# Patient Record
Sex: Female | Born: 1985
Health system: Southern US, Community
[De-identification: ages and names within clinical notes are randomized; demographics above are authoritative.]

## PROBLEM LIST (undated history)

## (undated) ENCOUNTER — Inpatient Hospital Stay (HOSPITAL_COMMUNITY): Payer: Self-pay

## (undated) DIAGNOSIS — K219 Gastro-esophageal reflux disease without esophagitis: Secondary | ICD-10-CM

## (undated) DIAGNOSIS — G44029 Chronic cluster headache, not intractable: Secondary | ICD-10-CM

## (undated) DIAGNOSIS — I10 Essential (primary) hypertension: Secondary | ICD-10-CM

## (undated) DIAGNOSIS — J301 Allergic rhinitis due to pollen: Secondary | ICD-10-CM

## (undated) DIAGNOSIS — G709 Myoneural disorder, unspecified: Secondary | ICD-10-CM

## (undated) DIAGNOSIS — G35 Multiple sclerosis: Secondary | ICD-10-CM

## (undated) HISTORY — DX: Multiple sclerosis: G35

## (undated) HISTORY — DX: Gastro-esophageal reflux disease without esophagitis: K21.9

## (undated) HISTORY — DX: Allergic rhinitis due to pollen: J30.1

## (undated) HISTORY — DX: Chronic cluster headache, not intractable: G44.029

---
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
## 2007-04-17 ENCOUNTER — Ambulatory Visit: Payer: Self-pay | Admitting: Internal Medicine

## 2008-03-29 ENCOUNTER — Encounter: Admission: RE | Admit: 2008-03-29 | Discharge: 2008-03-29 | Payer: Self-pay | Admitting: Obstetrics and Gynecology

## 2008-11-29 ENCOUNTER — Ambulatory Visit: Payer: Self-pay | Admitting: Internal Medicine

## 2008-11-29 DIAGNOSIS — K219 Gastro-esophageal reflux disease without esophagitis: Secondary | ICD-10-CM

## 2008-11-29 HISTORY — DX: Gastro-esophageal reflux disease without esophagitis: K21.9

## 2008-11-29 LAB — CONVERTED CEMR LAB
Basophils Absolute: 0 10*3/uL (ref 0.0–0.1)
Basophils Relative: 0.3 % (ref 0.0–3.0)
Eosinophils Absolute: 0.1 10*3/uL (ref 0.0–0.7)
Eosinophils Relative: 1.2 % (ref 0.0–5.0)
HCT: 39 % (ref 36.0–46.0)
Hemoglobin: 13.5 g/dL (ref 12.0–15.0)
Lymphocytes Relative: 53.6 % — ABNORMAL HIGH (ref 12.0–46.0)
MCHC: 34.5 g/dL (ref 30.0–36.0)
MCV: 90.2 fL (ref 78.0–100.0)
Monocytes Absolute: 0.6 10*3/uL (ref 0.1–1.0)
Monocytes Relative: 10.4 % (ref 3.0–12.0)
Neutro Abs: 2.1 10*3/uL (ref 1.4–7.7)
Neutrophils Relative %: 34.5 % — ABNORMAL LOW (ref 43.0–77.0)
Platelets: 232 10*3/uL (ref 150–400)
RBC: 4.34 M/uL (ref 3.87–5.11)
RDW: 12.3 % (ref 11.5–14.6)
TSH: 0.7 microintl units/mL (ref 0.35–5.50)
WBC: 6.1 10*3/uL (ref 4.5–10.5)

## 2008-12-03 ENCOUNTER — Ambulatory Visit: Payer: Self-pay | Admitting: Internal Medicine

## 2008-12-03 ENCOUNTER — Encounter: Admission: RE | Admit: 2008-12-03 | Discharge: 2008-12-03 | Payer: Self-pay | Admitting: Internal Medicine

## 2008-12-09 ENCOUNTER — Telehealth: Payer: Self-pay | Admitting: Internal Medicine

## 2008-12-09 ENCOUNTER — Ambulatory Visit: Payer: Self-pay | Admitting: Internal Medicine

## 2008-12-25 ENCOUNTER — Ambulatory Visit: Payer: Self-pay | Admitting: Internal Medicine

## 2009-01-02 ENCOUNTER — Telehealth: Payer: Self-pay | Admitting: *Deleted

## 2009-04-23 ENCOUNTER — Ambulatory Visit: Payer: Self-pay | Admitting: Internal Medicine

## 2010-01-11 ENCOUNTER — Emergency Department (HOSPITAL_COMMUNITY): Admission: EM | Admit: 2010-01-11 | Discharge: 2010-01-11 | Payer: Self-pay | Admitting: Emergency Medicine

## 2010-01-18 ENCOUNTER — Encounter: Admission: RE | Admit: 2010-01-18 | Discharge: 2010-01-18 | Payer: Self-pay | Admitting: Neurosurgery

## 2010-01-22 ENCOUNTER — Telehealth (INDEPENDENT_AMBULATORY_CARE_PROVIDER_SITE_OTHER): Payer: Self-pay | Admitting: *Deleted

## 2010-02-06 ENCOUNTER — Ambulatory Visit: Payer: Self-pay | Admitting: Internal Medicine

## 2010-02-06 DIAGNOSIS — G44029 Chronic cluster headache, not intractable: Secondary | ICD-10-CM

## 2010-02-06 DIAGNOSIS — J301 Allergic rhinitis due to pollen: Secondary | ICD-10-CM

## 2010-02-06 HISTORY — DX: Chronic cluster headache, not intractable: G44.029

## 2010-02-06 HISTORY — DX: Allergic rhinitis due to pollen: J30.1

## 2010-03-12 ENCOUNTER — Ambulatory Visit: Payer: Self-pay | Admitting: Internal Medicine

## 2010-10-11 ENCOUNTER — Encounter: Admission: RE | Admit: 2010-10-11 | Discharge: 2010-10-11 | Payer: Self-pay | Admitting: Neurosurgery

## 2010-10-15 ENCOUNTER — Encounter: Admission: RE | Admit: 2010-10-15 | Discharge: 2010-10-15 | Payer: Self-pay | Admitting: Neurosurgery

## 2010-10-22 ENCOUNTER — Telehealth: Payer: Self-pay | Admitting: Internal Medicine

## 2010-11-05 ENCOUNTER — Telehealth: Payer: Self-pay | Admitting: Internal Medicine

## 2010-12-17 NOTE — Assessment & Plan Note (Signed)
Summary: 1 MONTH FUP//CCM   Vital Signs:  Patient profile:   25 year old female Height:      65 inches Weight:      168 pounds BMI:     28.06 Temp:     98.2 degrees F oral Pulse rate:   76 / minute Resp:     14 per minute BP sitting:   120 / 80  (left arm)  Vitals Entered By: Willy Eddy, LPN (March 12, 2010 11:34 AM) CC: rov- headaches- continues to have and pt states the one sh e had last night was her worse one, Hypertension Management, URI symptoms   CC:  rov- headaches- continues to have and pt states the one sh e had last night was her worse one, Hypertension Management, and URI symptoms.  History of Present Illness:  URI Symptoms      This is a 25 year old woman who presents with URI symptoms.  The patient reports nasal congestion, clear nasal discharge, and earache.  The patient denies fever, low-grade fever (<100.5 degrees), fever of 100.5-103 degrees, fever of 103.1-104 degrees, fever to >104 degrees, stiff neck, dyspnea, wheezing, rash, vomiting, diarrhea, use of an antipyretic, and response to antipyretic.  The patient also reports itchy throat.  The patient denies the following risk factors for Strep sinusitis: unilateral facial pain, unilateral nasal discharge, poor response to decongestant, double sickening, tooth pain, Strep exposure, tender adenopathy, and absence of cough.  head ache patteri mproved   Hypertension History:      She denies headache, chest pain, palpitations, dyspnea with exertion, orthopnea, PND, peripheral edema, visual symptoms, neurologic problems, syncope, and side effects from treatment.        Negative major cardiovascular risk factors include female age less than 66 years old and non-tobacco-user status.    Preventive Screening-Counseling & Management  Alcohol-Tobacco     Smoking Status: never  Current Problems (verified): 1)  Allergic Rhinitis Due To Pollen  (ICD-477.0) 2)  Gerd  (ICD-530.81) 3)  Headache, Cluster, Chronic   (ICD-339.02) 4)  Gastroesophageal Reflux Disease, Mild  (ICD-530.81)  Current Medications (verified): 1)  Birth Control Pills .... Use As Directed 2)  Omeprazole 40 Mg Cpdr (Omeprazole) .... One By Mouth Daily 3)  Loratadine-Pseudoephedrine 10-240 Mg Xr24h-Tab (Loratadine-Pseudoephedrine) .... One By Mouth Bid 4)  Verapamil Hcl 40 Mg Tabs (Verapamil Hcl) .... One By Mouth Every 6 Hours With Food ( At Least Three Time A Day) 5)  Omnaris 50 Mcg/act Susp (Ciclesonide) .... Two Spray in Each Nostril  Two Times A Day  Allergies (verified): No Known Drug Allergies  Past History:  Social History: Last updated: 12/09/2008 Single Never Smoked Drug use-no  Risk Factors: Smoking Status: never (03/12/2010)  Past medical, surgical, family and social histories (including risk factors) reviewed, and no changes noted (except as noted below).  Past Medical History: Reviewed history from 12/25/2008 and no changes required. Headache GERD  Past Surgical History: Reviewed history from 12/09/2008 and no changes required. Denies surgical history  Family History: Reviewed history and no changes required.  Social History: Reviewed history from 12/09/2008 and no changes required. Single Never Smoked Drug use-no  Physical Exam  General:  Well-developed,well-nourished,in no acute distress; alert,appropriate and cooperative throughout examination Head:  Normocephalic and atraumatic without obvious abnormalities. No apparent alopecia or balding. Eyes:  vision grossly intact, pupils equal, and pupils round.   Ears:  R ear normal and L ear normal.   Nose:  no external deformity.  Mouth:  pharyngeal erythema, posterior lymphoid hypertrophy, and postnasal drip.   Neck:  No deformities, masses, or tenderness noted. Lungs:  Normal respiratory effort, chest expands symmetrically. Lungs are clear to auscultation, no crackles or wheezes. Heart:  Normal rate and regular rhythm. S1 and S2 normal  without gallop, murmur, click, rub or other extra sounds.   Impression & Recommendations:  Problem # 1:  HEADACHE, CLUSTER, CHRONIC (ICD-339.02) the verapamil helped the pattter but then she had a sinues HA with PND and conjestons which precipitated a different HA patterm  Problem # 2:  GERD (ICD-530.81) stable Her updated medication list for this problem includes:    Omeprazole 40 Mg Cpdr (Omeprazole) ..... One by mouth daily  Labs Reviewed: Hgb: 13.5 (11/29/2008)   Hct: 39.0 (11/29/2008)  Problem # 3:  ALLERGIC RHINITIS DUE TO POLLEN (ICD-477.0) omnaris depo shot if the symptoms persist fr a week after the shot and te allergrda may call in antibiotic  Complete Medication List: 1)  Birth Control Pills  .... Use as directed 2)  Omeprazole 40 Mg Cpdr (Omeprazole) .... One by mouth daily 3)  Loratadine-pseudoephedrine 10-240 Mg Xr24h-tab (Loratadine-pseudoephedrine) .... One by mouth bid 4)  Verapamil Hcl Cr 180 Mg Cr-tabs (Verapamil hcl) .... One by mouth daily 5)  Omnaris 50 Mcg/act Susp (Ciclesonide) .... Two spray in each nostril  two times a day 6)  Fexofenadine Hcl 180 Mg Tabs (Fexofenadine hcl) .... One by mouth daily  Hypertension Assessment/Plan:      The patient's hypertensive risk group is category A: No risk factors and no target organ damage.  Today's blood pressure is 120/80.  Her blood pressure goal is < 140/90.   Patient Instructions: 1)  Please schedule a follow-up appointment in 3 months. Prescriptions: VERAPAMIL HCL CR 180 MG CR-TABS (VERAPAMIL HCL) one by mouth daily  #30 x 11   Entered and Authorized by:   Stacie Glaze MD   Signed by:   Stacie Glaze MD on 03/12/2010   Method used:   Electronically to        [facility] (retail)       [address]       [place], [place]  [address]       Ph: [phone]       Fax: [phone]   RxID:   [id number] FEXOFENADINE HCL 180 MG TABS (FEXOFENADINE HCL) one by mouth daily   #30 x 11   Entered and Authorized by:   Stacie Glaze MD   Signed by:   Stacie Glaze MD on 03/12/2010   Method used:   Electronically to        [facility] (retail)       [address]       [place], [place]  [address]       Ph: [phone]       Fax: [phone]   RxID:   [id number]   Appended Document: Orders Update    Clinical Lists Changes  Orders: Added new Service order of Depo- Medrol 80mg  (J1040) - Signed Added new Service order of Admin of Therapeutic Inj  intramuscular or subcutaneous (06301) - Signed       Medication Administration  Injection # 1:    Medication: Depo- Medrol 80mg     Diagnosis: ALLERGIC RHINITIS DUE TO POLLEN (ICD-477.0)    Route: IM  Site: RUOQ gluteus    Exp Date: 09/15/2012    Lot #: [id number]    Mfr: Pharmacia    Comments: 1.5/120mg  given    Patient tolerated injection without complications    Given by: pam spell,rn  Orders Added: 1)  Depo- Medrol 80mg  [J1040] 2)  Admin of Therapeutic Inj  intramuscular or subcutaneous [[address]]

## 2010-12-17 NOTE — Progress Notes (Signed)
Summary: BP questions  Phone Note Call from Patient   Caller: Mom Call For: Stacie Glaze MD Reason for Call: Talk to Doctor Summary of Call: Pt's mom states that her daughter's BP was 148 100 at the Neurologist's office today and was advised to see Dr. Lovell Sheehan.  Appt was made for Monday, but she wants to know if Dr. Lovell Sheehan needs to give her meds before the appt?  This is the only BP reading she has.  [phone] Initial call taken by: Lynann Beaver CMA,  January 22, 2010 12:04 PM  Follow-up for Phone Call        per dr Lovell Sheehan- gpurchase a blood pressure cuff or go to local fire station and get bp readings twice a day  and bring readings at ov-- that bp is not that critical for someone her age Follow-up by: Willy Eddy, LPN,  January 22, 2010 12:57 PM  Additional Follow-up for Phone Call Additional follow up Details #1::        Phone Call Completed Additional Follow-up by: Raechel Ache, RN,  January 22, 2010 12:59 PM

## 2010-12-17 NOTE — Progress Notes (Signed)
Summary: questions about meds  Phone Note Call from Patient   Caller: Mom Call For: Stacie Glaze MD Summary of Call: Topamax is making her nauseated and headache is worse. [phone] Initial call taken by: Lynann Beaver CMA AAMA,  November 05, 2010 8:47 AM  Follow-up for Phone Call        stop the topamax increase the verapamil to 240 Follow-up by: Stacie Glaze MD,  November 05, 2010 8:54 AM    New/Updated Medications: VERELAN 240 MG XR24H-CAP (VERAPAMIL HCL) one by mouth daily Prescriptions: VERELAN 240 MG XR24H-CAP (VERAPAMIL HCL) one by mouth daily  #90 x 1   Entered by:   Lynann Beaver CMA AAMA   Authorized by:   Stacie Glaze MD   Signed by:   Lynann Beaver CMA AAMA on 11/05/2010   Method used:   Electronically to        [facility] (retail)       [address]       [place], [place]  [address]       Ph: [phone]       Fax: [phone]   RxID:   [id number]  Notified pt's mom.

## 2010-12-17 NOTE — Progress Notes (Signed)
Summary: headaches  Phone Note Call from Patient Call back at [phone]   Caller: Patient Call For: Stacie Glaze MD Reason for Call: Acute Illness Summary of Call: Verapamil is not helping with the headaches x 1 -2 weeks.  Nico[facility])  Initial call taken by: Lynann Beaver CMA AAMA,  October 22, 2010 1:13 PM  Follow-up for Phone Call        may stop verapamil-start topamax and titrate up-- topamax 25 1 at bedtime for 1 week- then 1 in am and 1 at bedtime for 1 week and then in  1in am and 2 at bedtime and continue with that dosage  Follow-up by: Willy Eddy, LPN,  October 22, 2010 1:51 PM    New/Updated Medications: TOPAMAX 25 MG TABS (TOPIRAMATE) 1 q hs x one week, then 1 in am and q hs x one week, then 1 in am and 2 q hs and continue Prescriptions: TOPAMAX 25 MG TABS (TOPIRAMATE) 1 q hs x one week, then 1 in am and q hs x one week, then 1 in am and 2 q hs and continue  #100 x 0   Entered by:   Lynann Beaver CMA AAMA   Authorized by:   Stacie Glaze MD   Signed by:   Lynann Beaver CMA AAMA on 10/22/2010   Method used:   Electronically to        [facility] (retail)       [address]       [place], [place]  [address]       Ph: [phone]       Fax: [phone]   RxID:   [id number]  Notified pt.

## 2010-12-17 NOTE — Assessment & Plan Note (Signed)
Summary: F/U ON HTN CONCERNS // RS   Vital Signs:  Patient profile:   25 year old female Height:      65 inches Weight:      170 pounds BMI:     28.39 Temp:     98.2 degrees F oral Pulse rate:   76 / minute Resp:     14 per minute BP sitting:   150 / 100  (left arm)  Vitals Entered By: Willy Eddy, LPN (February 06, 2010 10:07 AM) CC: c/o elevated bp-taking loratadine with sudafed, Headache   CC:  c/o elevated bp-taking loratadine with sudafed and Headache.  History of Present Illness: pt has been having cluster head aches and blood pressure was noted to be up then and now   Headache HPI:      The patient comes in for chronic management of headaches which have been unstable.  Since the last visit, the frequency of headaches have increased, and the intensity of the headaches have increased.  The headaches will last anywhere from 30 minutes to several days at a time.  Headaches have been occurring since age 32.  The patient is right handed.        The location of the headaches are bilateral.  Headache quality is throbbing or pulsating.  The headaches are associated with nausea, photophobia, and tearing of the eyes.        Positive alarm features include change in frequency from prior H/A's, change in severity from prior H/A's, and change in features from prior H/A's.         Preventive Screening-Counseling & Management  Alcohol-Tobacco     Smoking Status: never  Problems Prior to Update: 1)  Allergic Rhinitis Due To Other Allergen  (ICD-477.8) 2)  Gerd  (ICD-530.81) 3)  Acute Sinusitis, Unspecified  (ICD-461.9) 4)  Headache  (ICD-784.0) 5)  Gastroesophageal Reflux Disease, Mild  (ICD-530.81) 6)  Swelling, Neck  (ICD-784.2) 7)  Cough  (ICD-786.2)  Current Problems (verified): 1)  Allergic Rhinitis Due To Other Allergen  (ICD-477.8) 2)  Gerd  (ICD-530.81) 3)  Acute Sinusitis, Unspecified  (ICD-461.9) 4)  Headache  (ICD-784.0) 5)  Gastroesophageal Reflux Disease, Mild   (ICD-530.81) 6)  Swelling, Neck  (ICD-784.2) 7)  Cough  (ICD-786.2)  Medications Prior to Update: 1)  Birth Control Pills .... Use As Directed 2)  Omeprazole 40 Mg Cpdr (Omeprazole) .... One By Mouth Daily 3)  Loratadine-Pseudoephedrine 10-240 Mg Xr24h-Tab (Loratadine-Pseudoephedrine) .... One By Mouth Bid  Current Medications (verified): 1)  Birth Control Pills .... Use As Directed 2)  Omeprazole 40 Mg Cpdr (Omeprazole) .... One By Mouth Daily 3)  Loratadine-Pseudoephedrine 10-240 Mg Xr24h-Tab (Loratadine-Pseudoephedrine) .... One By Mouth Bid  Allergies (verified): No Known Drug Allergies  Past History:  Social History: Last updated: 12/09/2008 Single Never Smoked Drug use-no  Risk Factors: Smoking Status: never (02/06/2010)  Past medical, surgical, family and social histories (including risk factors) reviewed, and no changes noted (except as noted below).  Past Medical History: Reviewed history from 12/25/2008 and no changes required. Headache GERD  Past Surgical History: Reviewed history from 12/09/2008 and no changes required. Denies surgical history  Family History: Reviewed history and no changes required.  Social History: Reviewed history from 12/09/2008 and no changes required. Single Never Smoked Drug use-no  Review of Systems       The patient complains of anorexia.  The patient denies fever, weight loss, weight gain, vision loss, decreased hearing, hoarseness, chest pain, syncope, dyspnea on  exertion, peripheral edema, prolonged cough, headaches, hemoptysis, abdominal pain, melena, hematochezia, severe indigestion/heartburn, hematuria, incontinence, genital sores, muscle weakness, suspicious skin lesions, transient blindness, difficulty walking, depression, unusual weight change, abnormal bleeding, enlarged lymph nodes, angioedema, and breast masses.    Physical Exam  General:  Well-developed,well-nourished,in no acute distress; alert,appropriate and  cooperative throughout examination Head:  Normocephalic and atraumatic without obvious abnormalities. No apparent alopecia or balding. Eyes:  vision grossly intact, pupils equal, and pupils round.   Ears:  R ear normal and L ear normal.   Nose:  nasal dischargemucosal pallor, mucosal edema, and airflow obstruction.   Mouth:  pharyngeal erythema.   Neck:  No deformities, masses, or tenderness noted. Lungs:  Normal respiratory effort, chest expands symmetrically. Lungs are clear to auscultation, no crackles or wheezes. Heart:  Normal rate and regular rhythm. S1 and S2 normal without gallop, murmur, click, rub or other extra sounds. Abdomen:  Bowel sounds positive,abdomen soft and non-tender without masses, organomegaly or hernias noted.   Impression & Recommendations:  Problem # 1:  HEADACHE, CLUSTER, CHRONIC (ICD-339.02) will use a verapamil protocol and then add nasal spry to reduce allergy precipitants follow up protocol in 1 month see DC instruction for protocol  Problem # 2:  ALLERGIC RHINITIS DUE TO POLLEN (ICD-477.0) omnaris two puff by mouth two times a day  Complete Medication List: 1)  Birth Control Pills  .... Use as directed 2)  Omeprazole 40 Mg Cpdr (Omeprazole) .... One by mouth daily 3)  Loratadine-pseudoephedrine 10-240 Mg Xr24h-tab (Loratadine-pseudoephedrine) .... One by mouth bid 4)  Verapamil Hcl 40 Mg Tabs (Verapamil hcl) .... One by mouth every 6 hours with food ( at least three time a day) 5)  Omnaris 50 Mcg/act Susp (Ciclesonide) .... Two spray in each nostril  two times a day  Patient Instructions: 1)  the verapamil is to stop the cluster HA take at least three times a day with small amont of food 2)  use the nasel spray twice a day 3)  Please schedule a follow-up appointment in 1 month. Prescriptions: VERAPAMIL HCL 40 MG TABS (VERAPAMIL HCL) one by mouth every 6 hours with food ( at least three time a day)  #120 x 3   Entered and Authorized by:   Stacie Glaze MD   Signed by:   Stacie Glaze MD on 02/06/2010   Method used:   Electronically to        [facility] (retail)       [address]       [place], [place]  [address]       Ph: [phone]       Fax: [phone]   RxID:   [id number]

## 2011-02-04 LAB — CBC
HCT: 37.9 % (ref 36.0–46.0)
Hemoglobin: 12.9 g/dL (ref 12.0–15.0)
RBC: 4.32 MIL/uL (ref 3.87–5.11)
WBC: 8.9 10*3/uL (ref 4.0–10.5)

## 2011-02-04 LAB — ACETAMINOPHEN LEVEL: Acetaminophen (Tylenol), Serum: <10 ug/mL — ABNORMAL LOW (ref 10–30)

## 2011-02-04 LAB — DIFFERENTIAL
Basophils Absolute: 0 10*3/uL (ref 0.0–0.1)
Eosinophils Relative: 0 % (ref 0–5)
Lymphocytes Relative: 40 % (ref 12–46)
Lymphs Abs: 3.5 10*3/uL (ref 0.7–4.0)
Monocytes Absolute: 0.9 10*3/uL (ref 0.1–1.0)
Monocytes Relative: 10 % (ref 3–12)
Neutro Abs: 4.4 10*3/uL (ref 1.7–7.7)

## 2011-02-04 LAB — URINE MICROSCOPIC-ADD ON

## 2011-02-04 LAB — BASIC METABOLIC PANEL
Calcium: 9.5 mg/dL (ref 8.4–10.5)
GFR calc Af Amer: >60 mL/min (ref >60)
GFR calc non Af Amer: >60 mL/min (ref >60)
Potassium: 3.6 mEq/L (ref 3.5–5.1)
Sodium: 138 mEq/L (ref 135–145)

## 2011-02-04 LAB — URINALYSIS, ROUTINE W REFLEX MICROSCOPIC
Glucose, UA: NEGATIVE mg/dL
Ketones, ur: NEGATIVE mg/dL
Leukocytes, UA: NEGATIVE
Protein, ur: 30 mg/dL — AB
pH: 6.5 (ref 5.0–8.0)

## 2011-02-04 LAB — POCT PREGNANCY, URINE: Preg Test, Ur: NEGATIVE

## 2011-02-04 LAB — RAPID URINE DRUG SCREEN, HOSP PERFORMED
Cocaine: NOT DETECTED
Tetrahydrocannabinol: NOT DETECTED

## 2011-08-24 ENCOUNTER — Telehealth: Payer: Self-pay | Admitting: *Deleted

## 2011-08-24 NOTE — Telephone Encounter (Signed)
Mom called stating pt's BP has been running 146/104, 152/108, 145/107, and is on a BP med, but she does not know what.  Asking Dr. Lovell Sheehan for suggestions?

## 2011-08-24 NOTE — Telephone Encounter (Signed)
Notified Mom and appt scheduled with Dr. Caryl Never.

## 2011-08-24 NOTE — Telephone Encounter (Signed)
She has sudafed listed - if she is taking sudafed that could be the reason --- she takes verapamil---if it continues to be elevated she will have to see  A doc. Not been seen since 4-11 ( 1 and a half years).she needs ov anyway because it has been so long

## 2011-08-30 ENCOUNTER — Encounter: Payer: Self-pay | Admitting: Family Medicine

## 2011-08-30 ENCOUNTER — Ambulatory Visit (INDEPENDENT_AMBULATORY_CARE_PROVIDER_SITE_OTHER): Payer: Self-pay | Admitting: Family Medicine

## 2011-08-30 VITALS — BP 140/90 | Temp 99.2°F | Wt 184.0 lb

## 2011-08-30 DIAGNOSIS — IMO0001 Reserved for inherently not codable concepts without codable children: Secondary | ICD-10-CM

## 2011-08-30 DIAGNOSIS — R638 Other symptoms and signs concerning food and fluid intake: Secondary | ICD-10-CM

## 2011-08-30 DIAGNOSIS — R42 Dizziness and giddiness: Secondary | ICD-10-CM

## 2011-08-30 DIAGNOSIS — I10 Essential (primary) hypertension: Secondary | ICD-10-CM

## 2011-08-30 DIAGNOSIS — Z23 Encounter for immunization: Secondary | ICD-10-CM

## 2011-08-30 NOTE — Patient Instructions (Signed)

## 2011-08-30 NOTE — Progress Notes (Signed)
  Subjective:    Patient ID: [name], female    DOB: 28-Sep-1986, 25 y.o.   MRN: [id number]  HPI Seen for elevated blood pressure. History of questionable cluster headache. Has been on verapamil for several years. Also takes Maxzide per  her gynecologist apparently more for edema issues. Recently had some malaise and intermittent headaches. Had blood pressure recently at home 154/108. Frequent systolics 140-150 range with diastolics usually 90s to 100. Patient also relates some increased thirst and urine frequency. No recent weight changes. Also craving ice frequently. No reported history of anemia. Occasional dizziness.  Family history significant for mother with hypertension. Patient is not taking a regular nonsteroidals.  No regular alcohol use. No regular exercise.  Past Medical History  Diagnosis Date  . HEADACHE, CLUSTER, CHRONIC 02/06/2010  . Allergic rhinitis due to pollen 02/06/2010  . Esophageal reflux 11/29/2008   History reviewed. No pertinent past surgical history.  reports that she has never smoked. She does not have any smokeless tobacco history on file. Her alcohol and drug histories not on file. family history is not on file. Not on File    Review of Systems  Constitutional: Negative for fatigue and unexpected weight change.  Eyes: Negative for visual disturbance.  Respiratory: Negative for cough, chest tightness, shortness of breath and wheezing.   Cardiovascular: Negative for chest pain, palpitations and leg swelling.  Gastrointestinal: Negative for abdominal pain and blood in stool.  Genitourinary: Negative for hematuria.  Neurological: Negative for dizziness, seizures, syncope, weakness, light-headedness and headaches.       Objective:   Physical Exam  Constitutional: She is oriented to person, place, and time. She appears well-developed and well-nourished. No distress.  HENT:  Mouth/Throat: Oropharynx is clear and moist.  Eyes: Pupils are equal, round, and  reactive to light.  Neck: Neck supple. No thyromegaly present.  Cardiovascular: Normal rate and regular rhythm.   No murmur heard. Pulmonary/Chest: Effort normal and breath sounds normal. No respiratory distress. She has no wheezes. She has no rales.  Musculoskeletal: She exhibits no edema.  Lymphadenopathy:    She has no cervical adenopathy.  Neurological: She is alert and oriented to person, place, and time.          Assessment & Plan:  #1Hypertension. Marginal control. Discussed options. Rather than adding more medication at this time recommend trial of weight loss and sodium reduction. Establish more regular exercise. Follow with primary 3 months to reassess. Check basic metabolic panel. #2 recent ice craving. No history of anemia. Check CBC

## 2011-08-31 LAB — CBC WITH DIFFERENTIAL/PLATELET
Basophils Relative: 0.4 % (ref 0.0–3.0)
Eosinophils Absolute: 0 10*3/uL (ref 0.0–0.7)
HCT: 36.1 % (ref 36.0–46.0)
Hemoglobin: 11.9 g/dL — ABNORMAL LOW (ref 12.0–15.0)
Lymphocytes Relative: 35 % (ref 12.0–46.0)
Lymphs Abs: 2.2 10*3/uL (ref 0.7–4.0)
MCHC: 33 g/dL (ref 30.0–36.0)
Monocytes Relative: 9.1 % (ref 3.0–12.0)
Neutro Abs: 3.5 10*3/uL (ref 1.4–7.7)
RBC: 4.01 Mil/uL (ref 3.87–5.11)

## 2011-08-31 LAB — BASIC METABOLIC PANEL
CO2: 23 mEq/L (ref 19–32)
Calcium: 8.9 mg/dL (ref 8.4–10.5)
Potassium: 3.7 mEq/L (ref 3.5–5.1)
Sodium: 140 mEq/L (ref 135–145)

## 2011-09-01 NOTE — Progress Notes (Signed)
Quick Note:  Pt informed ______ 

## 2011-10-27 ENCOUNTER — Other Ambulatory Visit: Payer: Self-pay | Admitting: Neurosurgery

## 2011-10-27 DIAGNOSIS — R519 Headache, unspecified: Secondary | ICD-10-CM

## 2011-11-03 ENCOUNTER — Ambulatory Visit
Admission: RE | Admit: 2011-11-03 | Discharge: 2011-11-03 | Disposition: A | Discharge status: Home / Self Care | Payer: PRIVATE HEALTH INSURANCE | Source: Ambulatory Visit | Attending: Neurosurgery | Admitting: Neurosurgery

## 2011-11-03 DIAGNOSIS — R519 Headache, unspecified: Secondary | ICD-10-CM

## 2011-12-01 ENCOUNTER — Ambulatory Visit: Payer: Self-pay | Admitting: Internal Medicine

## 2011-12-16 ENCOUNTER — Encounter: Payer: Self-pay | Admitting: Family

## 2011-12-16 ENCOUNTER — Ambulatory Visit (INDEPENDENT_AMBULATORY_CARE_PROVIDER_SITE_OTHER): Payer: PRIVATE HEALTH INSURANCE | Admitting: Family

## 2011-12-16 DIAGNOSIS — J019 Acute sinusitis, unspecified: Secondary | ICD-10-CM

## 2011-12-16 DIAGNOSIS — J309 Allergic rhinitis, unspecified: Secondary | ICD-10-CM

## 2011-12-16 MED ORDER — AZITHROMYCIN 250 MG PO TABS: ORAL_TABLET | ORAL | Status: AC

## 2011-12-16 NOTE — Patient Instructions (Signed)

## 2011-12-16 NOTE — Progress Notes (Signed)
Subjective:    Patient ID: [name], female    DOB: 01/01/1986, 26 y.o.   MRN: [id number]  HPI 26 year old [place] American female, patient of Dr. Lovell Sheehan presents today with complaints of sneezing, coughing, congestion sore throat, sinus pressure and pain that has worsened over the last 5 days. Hasn't taken Zicam and Allegra with no relief. She started carefully yesterday with no relief she denies any fever, muscle aches or pain. She's a nonsmoker. She has a history of allergic rhinitis.   Review of Systems  Constitutional: Positive for fatigue.  HENT: Positive for congestion, sore throat, rhinorrhea, sneezing and postnasal drip.   Eyes: Negative.   Respiratory: Positive for cough. Negative for choking, chest tightness, wheezing and stridor.   Cardiovascular: Negative.   Neurological: Negative.   Hematological: Negative.   Psychiatric/Behavioral: Negative.    Past Medical History  Diagnosis Date  . HEADACHE, CLUSTER, CHRONIC 02/06/2010  . Allergic rhinitis due to pollen 02/06/2010  . Esophageal reflux 11/29/2008    History   Social History  . Marital Status: Single    Spouse Name: N/A    Number of Children: N/A  . Years of Education: N/A   Occupational History  . Not on file.   Social History Main Topics  . Smoking status: Never Smoker   . Smokeless tobacco: Not on file  . Alcohol Use: Not on file  . Drug Use: Not on file  . Sexually Active: Not on file   Other Topics Concern  . Not on file   Social History Narrative  . No narrative on file    No past surgical history on file.  No family history on file.  No Known Allergies  Current Outpatient Prescriptions on File Prior to Visit  Medication Sig Dispense Refill  . ciclesonide (OMNARIS) 50 MCG/ACT nasal spray Place 2 sprays into both nostrils daily.        . fexofenadine (ALLEGRA) 180 MG tablet Take 180 mg by mouth daily as needed.       . norethindrone-ethinyl estradiol-iron (ESTROSTEP FE,TILIA  FE,TRI-LEGEST FE) 1-20/1-30/1-35 MG-MCG tablet Take 1 tablet by mouth daily.        Marland Kitchen omeprazole (PRILOSEC) 40 MG capsule Take 40 mg by mouth daily.        Marland Kitchen triamterene-hydrochlorothiazide (MAXZIDE-25) 37.5-25 MG per tablet Take 1 tablet by mouth daily.        . verapamil (VERELAN PM) 240 MG 24 hr capsule Take 240 mg by mouth at bedtime.          BP 142/96  Temp(Src) 98.9 F (37.2 C) (Oral)  Wt 186 lb (84.369 kg)chart    Objective:   Physical Exam  Constitutional: She is oriented to person, place, and time. She appears well-developed and well-nourished.  HENT:  Right Ear: External ear normal.  Left Ear: External ear normal.  Nose: Nose normal.  Mouth/Throat: Oropharynx is clear and moist.       Tenderness to palpation of the frontal and maxillary sinuses. Pain is worse with forward bend  Neck: Normal range of motion. Neck supple.  Cardiovascular: Normal rate and regular rhythm.   Pulmonary/Chest: Effort normal and breath sounds normal.  Musculoskeletal: Normal range of motion.  Neurological: She is alert and oriented to person, place, and time.  Skin: Skin is warm.  Psychiatric: She has a normal mood and affect.          Assessment & Plan:  Assessment: Allergic rhinitis, acute sinusitis  Plan: Continue Allegra 180 mg once  daily. Zicam 3 days on and off. Z-Pak as directed. Rest. Drink clear fluids. Call the office is symptoms worsen or persist, recheck in schedule and when necessary.

## 2011-12-28 ENCOUNTER — Emergency Department (INDEPENDENT_AMBULATORY_CARE_PROVIDER_SITE_OTHER)
Admission: EM | Admit: 2011-12-28 | Discharge: 2011-12-28 | Disposition: A | Discharge status: Home / Self Care | Payer: Medicaid Other | Source: Home / Self Care | Attending: Emergency Medicine | Admitting: Emergency Medicine

## 2011-12-28 ENCOUNTER — Encounter (HOSPITAL_COMMUNITY): Payer: Self-pay | Admitting: *Deleted

## 2011-12-28 DIAGNOSIS — O200 Threatened abortion: Secondary | ICD-10-CM

## 2011-12-28 HISTORY — DX: Essential (primary) hypertension: I10

## 2011-12-28 LAB — POCT PREGNANCY, URINE: Preg Test, Ur: POSITIVE — AB

## 2011-12-28 LAB — HCG, QUANTITATIVE, PREGNANCY: hCG, Beta Chain, Quant, S: 42448 m[IU]/mL — ABNORMAL HIGH (ref <5)

## 2011-12-28 MED ORDER — PRENATAL RX 60-1 MG PO TABS: 1.0000 | ORAL_TABLET | Freq: Every day | ORAL | Status: DC

## 2011-12-28 MED ORDER — METHYLDOPA 250 MG PO TABS: 250.0000 mg | ORAL_TABLET | Freq: Two times a day (BID) | ORAL | Status: DC

## 2011-12-28 NOTE — ED Notes (Signed)
Reports 2 positive home pregnancy tests yesterday.  States started what she thought was her normal menstrual period 3 days ago, but took a test due to breast tenderness.  Denies any pain.

## 2011-12-28 NOTE — Discharge Instructions (Signed)
Threatened Miscarriage Bleeding during the first 20 weeks of pregnancy is common. This is sometimes called a threatened miscarriage. This is a pregnancy that is threatening to end before the twentieth week of pregnancy. Often this bleeding stops with bed rest or decreased activities as suggested by your caregiver and the pregnancy continues without any more problems. You may be asked to not have sexual intercourse, have orgasms or use tampons until further notice. Sometimes a threatened miscarriage can progress to a complete or incomplete miscarriage. This may or may not require further treatment. Some miscarriages occur before a woman misses a menstrual period and knows she is pregnant. Miscarriages occur in 15 to 20% of all pregnancies and usually occur during the first 13 weeks of the pregnancy. The exact cause of a miscarriage is usually never known. A miscarriage is natures way of ending a pregnancy that is abnormal or would not make it to term. There are some things that may put you at risk to have a miscarriage, such as:  Hormone problems.   Infection of the uterus or cervix.   Chronic illness, diabetes for example, especially if it is not controlled.   Abnormal shaped uterus.   Fibroids in the uterus.   Incompetent cervix (the cervix is too weak to hold the baby).   Smoking.   Drinking too much alcohol. It's best not to drink any alcohol when you are pregnant.   Taking illegal drugs.  TREATMENT  When a miscarriage becomes complete and all products of conception (all the tissue in the uterus) have been passed, often no treatment is needed. If you think you passed tissue, save it in a container and take it to your doctor for evaluation. If the miscarriage is incomplete (parts of the fetus or placenta remain in the uterus), further treatment may be needed. The most common reason for further treatment is continued bleeding (hemorrhage) because pregnancy tissue did not pass out of the  uterus. This often occurs if a miscarriage is incomplete. Tissue left behind may also become infected. Treatment usually is dilatation and curettage (the removal of the remaining products of pregnancy. This can be done by a simple sucking procedure (suction curettage) or a simple scraping of the inside of the uterus. This may be done in the hospital or in the caregiver's office. This is only done when your caregiver knows that there is no chance for the pregnancy to proceed to term. This is determined by physical examination, negative pregnancy test, falling pregnancy hormone count and/or, an ultrasound revealing a dead fetus. Miscarriages are often a very emotional time for prospective mothers and fathers. This is not you or your partners fault. It did not occur because of an inadequacy in you or your partner. Nearly all miscarriages occur because the pregnancy has started off wrongly. At least half of these pregnancies have a chromosomal abnormality. It is almost always not inherited. Others may have developmental problems with the fetus or placenta. This does not always show up even when the products miscarried are studied under the microscope. The miscarriage is nearly always not your fault and it is not likely that you could have prevented it from happening. If you are having emotional and grieving problems, talk to your health care provider and even seek counseling, if necessary, before getting pregnant again. You can begin trying for another pregnancy as soon as your caregiver says it is OK. HOME CARE INSTRUCTIONS   Your caregiver may order bed rest depending on how much bleeding   and cramping you are having. You may be limited to only getting up to go to the bathroom. You may be allowed to continue light activity. You may need to make arrangements for the care of your other children and for any other responsibilities.   Keep track of the number of pads you use each day, how often you have to change pads  and how saturated (soaked) they are. Record this information.   DO NOT USE TAMPONS. Do not douche, have sexual intercourse or orgasms until approved by your caregiver.   You may receive a follow up appointment for re-evaluation of your pregnancy and a repeat blood test. Re-evaluation often occurs after 2 days and again in 4 to 6 weeks. It is very important that you follow-up in the recommended time period.   If you are Rh negative and the father is Rh positive or you do not know the fathers' blood type, you may receive a shot (Rh immune globulin) to help prevent abnormal antibodies that can develop and affect the baby in any future pregnancies.  SEEK IMMEDIATE MEDICAL CARE IF:  You have severe cramps in your stomach, back, or abdomen.   You have a sudden onset of severe pain in the lower part of your abdomen.   You develop chills.   You run an unexplained temperature of 101 F (38.3 C) or higher.   You pass large clots or tissue. Save any tissue for your caregiver to inspect.   Your bleeding increases or you become light-headed, weak, or have fainting episodes.   You have a gush of fluid from your vagina.   You pass out. This could mean you have a tubal (ectopic) pregnancy.  Document Released: 11/01/2005 Document Revised: 07/14/2011 Document Reviewed: 06/17/2008 Exit[employer] Patient Information 2012 [employer]. 

## 2011-12-28 NOTE — ED Notes (Signed)
States has not missed any doses of oral contraceptive, but does not necessarily take at same time every day.

## 2011-12-28 NOTE — ED Provider Notes (Signed)
Chief Complaint  Patient presents with  . Possible Pregnancy    History of Present Illness:  For the past week the patient has felt lightheaded and has had some breast swelling and sensitivity. She took a couple pregnancy tests and they were positive. She had some vaginal bleeding from February 10-11. This was very light and less than a normal menses. She did not pass any clots, had any cramps or pain, or pass any tissue. She had been on birth control pills. She stopped these 2 days ago when she had results of the pregnancy test. She also stopped her blood pressure meds as well. Her prior menstrual period was December 29 to January 5 and was a normal menses. Her gynecologist is Dr. Marcelle Overlie. She's on several meds for high blood pressure, birth control pills, and Flonase and Allegra for allergies. Right now she's not had any bleeding and denies any pelvic pain.  Review of Systems:  Other than noted above, the patient denies any of the following symptoms: Systemic:  No fever, chills, sweats, fatigue, or weight loss. GI:  No abdominal pain, nausea, anorexia, vomiting, diarrhea, constipation, melena or hematochezia. GU:  No dysuria, frequency, urgency, hematuria, vaginal discharge, itching, or abnormal vaginal bleeding. Skin:  No rash or itching.   PMFSH:  Past medical history, family history, social history, meds, and allergies were reviewed.  Physical Exam:   Vital signs:  BP 146/78  Pulse 99  Temp(Src) 99.1 F (37.3 C) (Oral)  Resp 16  SpO2 100%  LMP 12/25/2011 General:  Alert, oriented and in no distress. Lungs:  Breath sounds clear and equal bilaterally.  No wheezes, rales or rhonchi. Heart:  Regular rhythm.  No gallops or murmers. Abdomen:  Soft, flat and non-distended.  No organomegaly or mass.  No tenderness, guarding or rebound.  Bowel sounds normally active. Skin:  Clear, warm and dry.  Labs:   Results for orders placed during the hospital encounter of 12/28/11  POCT PREGNANCY,  URINE      Component Value Range   Preg Test, Ur POSITIVE (*) NEGATIVE     Assessment:   Diagnoses that have been ruled out:  None  Diagnoses that are still under consideration:  None  Final diagnoses:  Threatened abortion    Plan:   1.  The following meds were prescribed:   New Prescriptions   METHYLDOPA (ALDOMET) 250 MG TABLET    Take 1 tablet (250 mg total) by mouth 2 (two) times daily.   PRENATAL VIT-FE FUMARATE-FA (PRENATAL MULTIVITAMIN) 60-1 MG TABLET    Take 1 tablet by mouth daily.   2.  The patient was instructed in symptomatic care and handouts were given. 3.  The patient was told to return if becoming worse in any way, if no better in 3 or 4 days, and given some red flag symptoms that would indicate earlier return. I told her to call back tomorrow about results of her quantitative hCG, followup with Dr. Marcelle Overlie in 48 hours, avoid intercourse, douching, or tampons in the meantime, and take no medications other than those prescribed. She was told to go to [facility] hospital immediately if she should develop any pelvic pain or bleeding.    Roque Lias, MD 12/28/11 2030

## 2011-12-29 ENCOUNTER — Inpatient Hospital Stay (HOSPITAL_COMMUNITY): Payer: Medicaid Other

## 2011-12-29 ENCOUNTER — Inpatient Hospital Stay (HOSPITAL_COMMUNITY)
Admission: AD | Admit: 2011-12-29 | Discharge: 2011-12-29 | Disposition: A | Discharge status: Home / Self Care | Payer: Medicaid Other | Source: Ambulatory Visit | Attending: Obstetrics and Gynecology | Admitting: Obstetrics and Gynecology

## 2011-12-29 ENCOUNTER — Encounter (HOSPITAL_COMMUNITY): Payer: Self-pay

## 2011-12-29 DIAGNOSIS — O4690 Antepartum hemorrhage, unspecified, unspecified trimester: Secondary | ICD-10-CM

## 2011-12-29 DIAGNOSIS — O209 Hemorrhage in early pregnancy, unspecified: Secondary | ICD-10-CM | POA: Insufficient documentation

## 2011-12-29 LAB — CBC
Hemoglobin: 11.7 g/dL — ABNORMAL LOW (ref 12.0–15.0)
MCHC: 35 g/dL (ref 30.0–36.0)
WBC: 7.4 10*3/uL (ref 4.0–10.5)

## 2011-12-29 LAB — WET PREP, GENITAL: Yeast Wet Prep HPF POC: NONE SEEN

## 2011-12-29 LAB — ABO/RH: ABO/RH(D): O POS

## 2011-12-29 MED ORDER — METRONIDAZOLE 500 MG PO TABS: 500.0000 mg | ORAL_TABLET | Freq: Two times a day (BID) | ORAL | Status: DC

## 2011-12-29 MED ORDER — METRONIDAZOLE 500 MG PO TABS: 500.0000 mg | ORAL_TABLET | Freq: Two times a day (BID) | ORAL | Status: AC

## 2011-12-29 NOTE — Progress Notes (Signed)
Patient states she started bleeding on 2-8. Was seen at Urgent Care yesterday and had a positive pregnancy. Has continued to have some cramping and light spotting on and off.

## 2011-12-29 NOTE — ED Provider Notes (Signed)
History   Pt presents today c/o vag spotting for the past 3 days. She has also had some lower abd cramping that comes and goes. She denies severe pain or recent intercourse. She was seen at an urgent care yesterday for the same. She has not had an ultrasound.  Chief Complaint  Patient presents with  . Abdominal Cramping   HPI  OB History    Grav Para Term Preterm Abortions TAB SAB Ect Mult Living   1               Past Medical History  Diagnosis Date  . HEADACHE, CLUSTER, CHRONIC 02/06/2010  . Allergic rhinitis due to pollen 02/06/2010  . Esophageal reflux 11/29/2008  . Hypertension     Past Surgical History  Procedure Date  . No past surgeries     History reviewed. No pertinent family history.  History  Substance Use Topics  . Smoking status: Never Smoker   . Smokeless tobacco: Not on file  . Alcohol Use: No    Allergies: No Known Allergies  Prescriptions prior to admission  Medication Sig Dispense Refill  . methyldopa (ALDOMET) 250 MG tablet Take 1 tablet (250 mg total) by mouth 2 (two) times daily.  60 tablet  0  . Prenatal Vit-Fe Fumarate-FA (PRENATAL MULTIVITAMIN) 60-1 MG tablet Take 1 tablet by mouth daily.  30 tablet  0    Review of Systems  Constitutional: Positive for malaise/fatigue. Negative for fever and chills.  Eyes: Negative for blurred vision and double vision.  Respiratory: Negative for cough, hemoptysis, sputum production, shortness of breath and wheezing.   Cardiovascular: Negative for chest pain and palpitations.  Gastrointestinal: Positive for abdominal pain. Negative for nausea, vomiting, diarrhea and constipation.  Genitourinary: Negative for dysuria, urgency, frequency and hematuria.  Neurological: Negative for dizziness and headaches.  Psychiatric/Behavioral: Negative for depression and suicidal ideas.   Physical Exam   Blood pressure 149/92, pulse 88, temperature 100 F (37.8 C), temperature source Oral, resp. rate 16, height 5\' 3"  (1.6  m), weight 184 lb 12.8 oz (83.825 kg), last menstrual period 12/25/2011, SpO2 100.00%.  Physical Exam  Nursing note and vitals reviewed. Constitutional: She is oriented to person, place, and time. She appears well-developed and well-nourished. No distress.  HENT:  Head: Normocephalic and atraumatic.  Eyes: EOM are normal. Pupils are equal, round, and reactive to light.  GI: Soft. She exhibits no distension. There is no tenderness. There is no rebound and no guarding.  Genitourinary: There is bleeding around the vagina. Vaginal discharge found.       Creamy, blood-tinged vag dc present. Cervix Lg/closed.  Neurological: She is alert and oriented to person, place, and time.  Skin: Skin is warm and dry. She is not diaphoretic.  Psychiatric: She has a normal mood and affect. Judgment and thought content normal.    MAU Course  Procedures  Results for orders placed during the hospital encounter of 12/29/11 (from the past 24 hour(s))  WET PREP, GENITAL     Status: Abnormal   Collection Time   12/29/11 12:50 PM      Component Value Range   Yeast Wet Prep HPF POC NONE SEEN  NONE SEEN    Trich, Wet Prep NONE SEEN  NONE SEEN    Clue Cells Wet Prep HPF POC TOO NUMEROUS TO COUNT (*) NONE SEEN    WBC, Wet Prep HPF POC MANY (*) NONE SEEN   CBC     Status: Abnormal   Collection Time  12/29/11 12:55 PM      Component Value Range   WBC 7.4  4.0 - 10.5 (K/uL)   RBC 4.02  3.87 - 5.11 (MIL/uL)   Hemoglobin 11.7 (*) 12.0 - 15.0 (g/dL)   HCT 16.1 (*) 09.6 - 46.0 (%)   MCV 83.1  78.0 - 100.0 (fL)   MCH 29.1  26.0 - 34.0 (pg)   MCHC 35.0  30.0 - 36.0 (g/dL)   RDW 04.5  40.9 - 81.1 (%)   Platelets 271  150 - 400 (K/uL)  ABO/RH     Status: Normal   Collection Time   12/29/11 12:55 PM      Component Value Range   ABO/RH(D) O POS     US shows single IUP with cardiac activity @13wks  with marginal cord insertion. Also with 5.5cm ovarian cyst. Assessment and Plan  Vag bleeding in preg: discussed with  pt at length. She will f/u with Dr. Huel Coventry office. Discussed diet, activity, risks, and precautions.  Clinton Gallant. Mardy Lucier III, DrHSc, MPAS, PA-C  12/29/2011, 12:55 PM   Henrietta Hoover, PA 12/29/11 1402

## 2011-12-29 NOTE — Discharge Instructions (Signed)
Vaginal Bleeding During Pregnancy °A small amount of bleeding from the vagina can happen anytime during pregnancy. Be sure to tell your doctor about all vaginal bleeding.  °HOME CARE °· Get plenty of rest and sleep.  °· Count the number of pads you use each day. Do not use tampons.  °· Save any tissue you pass for your doctor to see.  °· Do not exercise  °· Do not do any heavy lifting.  °· Avoid going up and down stairs. If you must climb stairs, go slowly.  °· Do not have sex (intercourse) or orgasms until approved by your doctor.  °· Do not douche.  °· Only take medicine as told by your doctor. Do not take aspirin.  °· Eat healthy.  °· Always keep your follow-up appointments.  °GET HELP RIGHT AWAY IF:  °· You feel the baby moving less or not moving at all.  °· The bleeding gets worse.  °· You have very painful cramps or pain in your stomach or back.  °· You pass large clots or anything that looks like tissue.  °· You have a temperature by mouth above 102° F (38.9° C).  °· You feel very weak.  °· You have chills.  °· You feel dizzy or pass out (faint).  °· You have a gush of fluid from the vagina.  °MAKE SURE YOU:  °· Understand these instructions.  °· Will watch your condition.  °· Will get help right away if you are not doing well or get worse.  °Document Released: 08/10/2008 Document Revised: 07/14/2011 Document Reviewed: 10/07/2009 °Exit[employer]® Patient Information ©2012 [employer]. °

## 2011-12-31 ENCOUNTER — Telehealth (HOSPITAL_COMMUNITY): Payer: Self-pay | Admitting: *Deleted

## 2011-12-31 NOTE — ED Notes (Signed)
1659 Pt. called back on VM and said she already got the result 2/13. 1755 I called pt. back and verified Dr. Marcelle Overlie was her OB-GYN and let her know I faxed the result to his office. She called them this morning to schedule f/u appt. [name] 12/31/2011

## 2011-12-31 NOTE — ED Notes (Signed)
Quantitative HCG 42448.  Dr. Lorenz Coaster has reviewed it and requested I fax it to her OB/GYN. Lab faxed to Dr. Marcelle Overlie and confirmation received. I called left message for pt. to call. Vassie Moselle 12/31/2011

## 2012-01-12 LAB — OB RESULTS CONSOLE RUBELLA ANTIBODY, IGM: Rubella: IMMUNE

## 2012-01-12 LAB — OB RESULTS CONSOLE HEPATITIS B SURFACE ANTIGEN: Hepatitis B Surface Ag: NEGATIVE

## 2012-03-17 ENCOUNTER — Inpatient Hospital Stay (HOSPITAL_COMMUNITY)
Admission: AD | Admit: 2012-03-17 | Discharge: 2012-03-17 | Disposition: A | Discharge status: Home / Self Care | Payer: Medicaid Other | Source: Ambulatory Visit | Attending: Obstetrics and Gynecology | Admitting: Obstetrics and Gynecology

## 2012-03-17 ENCOUNTER — Encounter (HOSPITAL_COMMUNITY): Payer: Self-pay | Admitting: *Deleted

## 2012-03-17 DIAGNOSIS — O10019 Pre-existing essential hypertension complicating pregnancy, unspecified trimester: Secondary | ICD-10-CM | POA: Insufficient documentation

## 2012-03-17 DIAGNOSIS — M545 Low back pain, unspecified: Secondary | ICD-10-CM | POA: Insufficient documentation

## 2012-03-17 DIAGNOSIS — O99891 Other specified diseases and conditions complicating pregnancy: Secondary | ICD-10-CM | POA: Insufficient documentation

## 2012-03-17 DIAGNOSIS — R109 Unspecified abdominal pain: Secondary | ICD-10-CM | POA: Insufficient documentation

## 2012-03-17 DIAGNOSIS — O26899 Other specified pregnancy related conditions, unspecified trimester: Secondary | ICD-10-CM

## 2012-03-17 LAB — URINE MICROSCOPIC-ADD ON

## 2012-03-17 LAB — URINALYSIS, ROUTINE W REFLEX MICROSCOPIC
Glucose, UA: NEGATIVE mg/dL
Hgb urine dipstick: NEGATIVE
Specific Gravity, Urine: 1.02 (ref 1.005–1.030)

## 2012-03-17 LAB — WET PREP, GENITAL
Clue Cells Wet Prep HPF POC: NONE SEEN
Trich, Wet Prep: NONE SEEN

## 2012-03-17 NOTE — MAU Provider Note (Signed)
History     CSN: [id number]  Arrival date and time: 03/17/12 1551   None     Chief Complaint  Patient presents with  . Back Pain   HPI Pt is [redacted]w[redacted]d pregnant and presents with low back pain pain radiating to lower abdomen on both sides.  The pain was worse when she was sitting there.  She denies spotting or bleeding or vaginal discharge.  She denies dysuria or UTI symptoms.  She has had some constipation but had a runny bowel movement yesterday.  She is hypertensive on Aldomet BID.  She has had hypertension for about 2 to 3 years.  The 24th BP 130/78- usually runs ~140-148/80-90.  She has a slight headache at this moment 4/10 Past Medical History  Diagnosis Date  . HEADACHE, CLUSTER, CHRONIC 02/06/2010  . Allergic rhinitis due to pollen 02/06/2010  . Esophageal reflux 11/29/2008  . Hypertension     Past Surgical History  Procedure Date  . No past surgeries     History reviewed. No pertinent family history.  History  Substance Use Topics  . Smoking status: Never Smoker   . Smokeless tobacco: Not on file  . Alcohol Use: No    Allergies: No Known Allergies  Prescriptions prior to admission  Medication Sig Dispense Refill  . methyldopa (ALDOMET) 250 MG tablet Take 1 tablet (250 mg total) by mouth 2 (two) times daily.  60 tablet  0  . Prenatal Vit-Fe Fumarate-FA (PRENATAL MULTIVITAMIN) TABS Take 1 tablet by mouth daily.        ROS Physical Exam   Blood pressure 147/95, pulse 84, temperature 98.1 F (36.7 C), temperature source Oral, resp. rate 20, height 5\' 4"  (1.626 m), weight 197 lb (89.359 kg), last menstrual period 12/25/2011, SpO2 100.00%, unknown if currently breastfeeding.  Physical Exam  Constitutional: She is oriented to person, place, and time. She appears well-developed and well-nourished.  HENT:  Head: Normocephalic.  Eyes: Pupils are equal, round, and reactive to light.  Neck: Normal range of motion. Neck supple.  Cardiovascular: Normal rate.     Respiratory: Effort normal.  GI: Soft. She exhibits no distension. There is no tenderness. There is no rebound.       FHR good variability, no ctx noted on monitor or palpated  Genitourinary: Vagina normal.       Cervix closed and firm, long, NT; uterus nontender- no ctx palpated  Musculoskeletal: Normal range of motion.  Neurological: She is alert and oriented to person, place, and time.  Skin: Skin is warm and dry.  Psychiatric: She has a normal mood and affect.    MAU Course  Procedures Results for orders placed during the hospital encounter of 03/17/12 (from the past 24 hour(s))  URINALYSIS, ROUTINE W REFLEX MICROSCOPIC     Status: Abnormal   Collection Time   03/17/12  4:00 PM      Component Value Range   Color, Urine YELLOW  YELLOW    APPearance CLOUDY (*) CLEAR    Specific Gravity, Urine 1.020  1.005 - 1.030    pH 7.5  5.0 - 8.0    Glucose, UA NEGATIVE  NEGATIVE (mg/dL)   Hgb urine dipstick NEGATIVE  NEGATIVE    Bilirubin Urine NEGATIVE  NEGATIVE    Ketones, ur NEGATIVE  NEGATIVE (mg/dL)   Protein, ur NEGATIVE  NEGATIVE (mg/dL)   Urobilinogen, UA 0.2  0.0 - 1.0 (mg/dL)   Nitrite NEGATIVE  NEGATIVE    Leukocytes, UA SMALL (*) NEGATIVE  URINE MICROSCOPIC-ADD ON     Status: Abnormal   Collection Time   03/17/12  4:00 PM      Component Value Range   Squamous Epithelial / LPF MANY (*) RARE    WBC, UA 3-6  <3 (WBC/hpf)   Bacteria, UA FEW (*) RARE    Urine-Other MUCOUS PRESENT    WET PREP, GENITAL     Status: Abnormal   Collection Time   03/17/12  5:17 PM      Component Value Range   Yeast Wet Prep HPF POC NONE SEEN  NONE SEEN    Trich, Wet Prep NONE SEEN  NONE SEEN    Clue Cells Wet Prep HPF POC NONE SEEN  NONE SEEN    WBC, Wet Prep HPF POC FEW (*) NONE SEEN    discusssed with Dr. Arelia Sneddon Assessment and Plan  Abdominal pain in pregnancy Hypertension- continue Aldomet BID  Angelize Ryce 03/17/2012, 4:43 PM

## 2012-03-17 NOTE — MAU Note (Signed)
Back pain since yesterday , radiates to left side, no flank pain, no bleeding, denies UC or abd tightness

## 2012-03-19 LAB — URINE CULTURE

## 2012-06-28 ENCOUNTER — Encounter (HOSPITAL_COMMUNITY): Payer: Self-pay | Admitting: *Deleted

## 2012-06-28 ENCOUNTER — Inpatient Hospital Stay (HOSPITAL_COMMUNITY)
Admission: AD | Admit: 2012-06-28 | Discharge: 2012-07-02 | DRG: 765 | Disposition: A | Discharge status: Home / Self Care | Payer: Medicaid Other | Source: Ambulatory Visit | Attending: Obstetrics and Gynecology | Admitting: Obstetrics and Gynecology

## 2012-06-28 DIAGNOSIS — O324XX0 Maternal care for high head at term, not applicable or unspecified: Secondary | ICD-10-CM | POA: Diagnosis present

## 2012-06-28 DIAGNOSIS — O1002 Pre-existing essential hypertension complicating childbirth: Secondary | ICD-10-CM | POA: Diagnosis present

## 2012-06-28 LAB — TYPE AND SCREEN: ABO/RH(D): O POS

## 2012-06-28 LAB — CBC
MCH: 28.9 pg (ref 26.0–34.0)
MCHC: 34.8 g/dL (ref 30.0–36.0)
Platelets: 268 10*3/uL (ref 150–400)
RBC: 3.88 MIL/uL (ref 3.87–5.11)

## 2012-06-28 MED ORDER — FLEET ENEMA 7-19 GM/118ML RE ENEM: 1.0000 | ENEMA | RECTAL | Status: DC | PRN

## 2012-06-28 MED ORDER — OXYTOCIN 40 UNITS IN LACTATED RINGERS INFUSION - SIMPLE MED: 62.5000 mL/h | Freq: Once | INTRAVENOUS | Status: DC

## 2012-06-28 MED ORDER — IBUPROFEN 600 MG PO TABS: 600.0000 mg | ORAL_TABLET | Freq: Four times a day (QID) | ORAL | Status: DC | PRN

## 2012-06-28 MED ORDER — LACTATED RINGERS IV SOLN
INTRAVENOUS | Status: DC
  Administered 2012-06-29: 04:00:00 via INTRAVENOUS

## 2012-06-28 MED ORDER — MISOPROSTOL 25 MCG QUARTER TABLET
25.0000 ug | ORAL_TABLET | ORAL | Status: DC | PRN
  Administered 2012-06-28 – 2012-06-29 (×2): 25 ug via VAGINAL
  Filled 2012-06-28 (×2): qty 0.25

## 2012-06-28 MED ORDER — METHYLDOPA 250 MG PO TABS
250.0000 mg | ORAL_TABLET | Freq: Two times a day (BID) | ORAL | Status: DC
  Administered 2012-06-28 – 2012-06-29 (×2): 250 mg via ORAL
  Filled 2012-06-28 (×4): qty 1

## 2012-06-28 MED ORDER — OXYCODONE-ACETAMINOPHEN 5-325 MG PO TABS: 1.0000 | ORAL_TABLET | ORAL | Status: DC | PRN

## 2012-06-28 MED ORDER — LIDOCAINE HCL (PF) 1 % IJ SOLN
30.0000 mL | INTRAMUSCULAR | Status: DC | PRN
  Filled 2012-06-28: qty 30

## 2012-06-28 MED ORDER — LACTATED RINGERS IV SOLN: 500.0000 mL | INTRAVENOUS | Status: DC | PRN

## 2012-06-28 MED ORDER — ONDANSETRON HCL 4 MG/2ML IJ SOLN: 4.0000 mg | Freq: Four times a day (QID) | INTRAMUSCULAR | Status: DC | PRN

## 2012-06-28 MED ORDER — CITRIC ACID-SODIUM CITRATE 334-500 MG/5ML PO SOLN
30.0000 mL | ORAL | Status: DC | PRN
  Administered 2012-06-29: 30 mL via ORAL
  Filled 2012-06-28: qty 15

## 2012-06-28 MED ORDER — ACETAMINOPHEN 325 MG PO TABS: 650.0000 mg | ORAL_TABLET | ORAL | Status: DC | PRN

## 2012-06-28 MED ORDER — TERBUTALINE SULFATE 1 MG/ML IJ SOLN: 0.2500 mg | Freq: Once | INTRAMUSCULAR | Status: AC | PRN

## 2012-06-28 MED ORDER — OXYTOCIN BOLUS FROM INFUSION
250.0000 mL | Freq: Once | INTRAVENOUS | Status: DC
  Filled 2012-06-28: qty 500

## 2012-06-29 ENCOUNTER — Encounter (HOSPITAL_COMMUNITY): Payer: Self-pay | Admitting: *Deleted

## 2012-06-29 ENCOUNTER — Encounter (HOSPITAL_COMMUNITY): Payer: Self-pay | Admitting: Anesthesiology

## 2012-06-29 ENCOUNTER — Encounter (HOSPITAL_COMMUNITY)
Admission: AD | Disposition: A | Discharge status: Home / Self Care | Payer: Self-pay | Source: Ambulatory Visit | Attending: Obstetrics and Gynecology

## 2012-06-29 ENCOUNTER — Inpatient Hospital Stay (HOSPITAL_COMMUNITY): Payer: Medicaid Other | Admitting: Anesthesiology

## 2012-06-29 LAB — RPR: RPR Ser Ql: NONREACTIVE

## 2012-06-29 SURGERY — Surgical Case
Anesthesia: Regional | Site: Abdomen | Wound class: Clean Contaminated

## 2012-06-29 MED ORDER — DIPHENHYDRAMINE HCL 50 MG/ML IJ SOLN: 12.5000 mg | INTRAMUSCULAR | Status: DC | PRN

## 2012-06-29 MED ORDER — SODIUM BICARBONATE 8.4 % IV SOLN
INTRAVENOUS | Status: DC | PRN
  Administered 2012-06-29: 4 mL via EPIDURAL

## 2012-06-29 MED ORDER — EPHEDRINE 5 MG/ML INJ: 10.0000 mg | INTRAVENOUS | Status: DC | PRN

## 2012-06-29 MED ORDER — LIDOCAINE-EPINEPHRINE (PF) 2 %-1:200000 IJ SOLN
INTRAMUSCULAR | Status: AC
  Filled 2012-06-29: qty 20

## 2012-06-29 MED ORDER — FENTANYL 2.5 MCG/ML BUPIVACAINE 1/10 % EPIDURAL INFUSION (WH - ANES)
INTRAMUSCULAR | Status: DC | PRN
  Administered 2012-06-29: 14 mL/h via EPIDURAL

## 2012-06-29 MED ORDER — LACTATED RINGERS IV SOLN
500.0000 mL | Freq: Once | INTRAVENOUS | Status: AC
  Administered 2012-06-29 (×2): via INTRAVENOUS
  Administered 2012-06-29: 500 mL via INTRAVENOUS

## 2012-06-29 MED ORDER — FENTANYL 2.5 MCG/ML BUPIVACAINE 1/10 % EPIDURAL INFUSION (WH - ANES)
14.0000 mL/h | INTRAMUSCULAR | Status: DC
  Administered 2012-06-29 (×2): 14 mL/h via EPIDURAL
  Filled 2012-06-29 (×3): qty 60

## 2012-06-29 MED ORDER — SCOPOLAMINE 1 MG/3DAYS TD PT72
MEDICATED_PATCH | TRANSDERMAL | Status: AC
  Filled 2012-06-29: qty 1

## 2012-06-29 MED ORDER — 0.9 % SODIUM CHLORIDE (POUR BTL) OPTIME
TOPICAL | Status: DC | PRN
  Administered 2012-06-29: 1000 mL

## 2012-06-29 MED ORDER — PHENYLEPHRINE 40 MCG/ML (10ML) SYRINGE FOR IV PUSH (FOR BLOOD PRESSURE SUPPORT): 80.0000 ug | PREFILLED_SYRINGE | INTRAVENOUS | Status: DC | PRN

## 2012-06-29 MED ORDER — TERBUTALINE SULFATE 1 MG/ML IJ SOLN: 0.2500 mg | Freq: Once | INTRAMUSCULAR | Status: DC | PRN

## 2012-06-29 MED ORDER — OXYTOCIN 10 UNIT/ML IJ SOLN
40.0000 [IU] | INTRAVENOUS | Status: DC | PRN
  Administered 2012-06-29: 40 [IU] via INTRAVENOUS

## 2012-06-29 MED ORDER — KETOROLAC TROMETHAMINE 30 MG/ML IJ SOLN
30.0000 mg | Freq: Four times a day (QID) | INTRAMUSCULAR | Status: AC | PRN
  Administered 2012-06-29: 30 mg via INTRAVENOUS

## 2012-06-29 MED ORDER — CEFAZOLIN SODIUM-DEXTROSE 2-3 GM-% IV SOLR
INTRAVENOUS | Status: AC
  Filled 2012-06-29: qty 50

## 2012-06-29 MED ORDER — CEFAZOLIN SODIUM-DEXTROSE 2-3 GM-% IV SOLR
2.0000 g | Freq: Three times a day (TID) | INTRAVENOUS | Status: AC
  Administered 2012-06-30 (×2): 2 g via INTRAVENOUS
  Filled 2012-06-29 (×2): qty 50

## 2012-06-29 MED ORDER — MORPHINE SULFATE (PF) 0.5 MG/ML IJ SOLN
INTRAMUSCULAR | Status: DC | PRN
Start: 2012-06-29 — End: 2012-06-29
  Administered 2012-06-29: 3 mg via EPIDURAL

## 2012-06-29 MED ORDER — MEPERIDINE HCL 25 MG/ML IJ SOLN: 6.2500 mg | INTRAMUSCULAR | Status: DC | PRN

## 2012-06-29 MED ORDER — MIDAZOLAM HCL 2 MG/2ML IJ SOLN: 0.5000 mg | Freq: Once | INTRAMUSCULAR | Status: AC | PRN

## 2012-06-29 MED ORDER — DEXAMETHASONE SODIUM PHOSPHATE 10 MG/ML IJ SOLN
INTRAMUSCULAR | Status: AC
  Filled 2012-06-29: qty 1

## 2012-06-29 MED ORDER — MORPHINE SULFATE (PF) 0.5 MG/ML IJ SOLN
INTRAMUSCULAR | Status: DC | PRN
  Administered 2012-06-29: 2 mg via INTRAVENOUS

## 2012-06-29 MED ORDER — OXYTOCIN 40 UNITS IN LACTATED RINGERS INFUSION - SIMPLE MED
1.0000 m[IU]/min | INTRAVENOUS | Status: DC
  Administered 2012-06-29: 2 m[IU]/min via INTRAVENOUS
  Filled 2012-06-29: qty 1000

## 2012-06-29 MED ORDER — SODIUM BICARBONATE 8.4 % IV SOLN
INTRAVENOUS | Status: AC
  Filled 2012-06-29: qty 50

## 2012-06-29 MED ORDER — KETOROLAC TROMETHAMINE 30 MG/ML IJ SOLN
INTRAMUSCULAR | Status: AC
  Filled 2012-06-29: qty 1

## 2012-06-29 MED ORDER — ONDANSETRON HCL 4 MG/2ML IJ SOLN
INTRAMUSCULAR | Status: DC | PRN
  Administered 2012-06-29: 4 mg via INTRAVENOUS

## 2012-06-29 MED ORDER — CEFAZOLIN SODIUM-DEXTROSE 2-3 GM-% IV SOLR
INTRAVENOUS | Status: DC | PRN
  Administered 2012-06-29: 2 g via INTRAVENOUS

## 2012-06-29 MED ORDER — PHENYLEPHRINE 40 MCG/ML (10ML) SYRINGE FOR IV PUSH (FOR BLOOD PRESSURE SUPPORT)
80.0000 ug | PREFILLED_SYRINGE | INTRAVENOUS | Status: DC | PRN
  Filled 2012-06-29: qty 5

## 2012-06-29 MED ORDER — MORPHINE SULFATE 0.5 MG/ML IJ SOLN
INTRAMUSCULAR | Status: AC
  Filled 2012-06-29: qty 10

## 2012-06-29 MED ORDER — OXYTOCIN 10 UNIT/ML IJ SOLN
INTRAMUSCULAR | Status: AC
  Filled 2012-06-29: qty 4

## 2012-06-29 MED ORDER — SCOPOLAMINE 1 MG/3DAYS TD PT72
1.0000 | MEDICATED_PATCH | Freq: Once | TRANSDERMAL | Status: DC
  Administered 2012-06-29: 1.5 mg via TRANSDERMAL

## 2012-06-29 MED ORDER — KETOROLAC TROMETHAMINE 30 MG/ML IJ SOLN: 30.0000 mg | Freq: Four times a day (QID) | INTRAMUSCULAR | Status: AC | PRN

## 2012-06-29 MED ORDER — EPHEDRINE 5 MG/ML INJ
10.0000 mg | INTRAVENOUS | Status: DC | PRN
  Filled 2012-06-29: qty 4

## 2012-06-29 MED ORDER — MEPERIDINE HCL 25 MG/ML IJ SOLN
6.2500 mg | INTRAMUSCULAR | Status: DC | PRN
  Administered 2012-06-29: 6.25 mg via INTRAVENOUS

## 2012-06-29 MED ORDER — ONDANSETRON HCL 4 MG/2ML IJ SOLN
INTRAMUSCULAR | Status: AC
  Filled 2012-06-29: qty 2

## 2012-06-29 MED ORDER — PROMETHAZINE HCL 25 MG/ML IJ SOLN: 6.2500 mg | INTRAMUSCULAR | Status: DC | PRN

## 2012-06-29 MED ORDER — FENTANYL CITRATE 0.05 MG/ML IJ SOLN: 25.0000 ug | INTRAMUSCULAR | Status: DC | PRN

## 2012-06-29 MED ORDER — DEXAMETHASONE SODIUM PHOSPHATE 10 MG/ML IJ SOLN
INTRAMUSCULAR | Status: DC | PRN
  Administered 2012-06-29: 10 mg via INTRAVENOUS

## 2012-06-29 MED ORDER — MEPERIDINE HCL 25 MG/ML IJ SOLN
INTRAMUSCULAR | Status: AC
  Filled 2012-06-29: qty 1

## 2012-06-29 SURGICAL SUPPLY — 39 items
APL SKNCLS STERI-STRIP NONHPOA (GAUZE/BANDAGES/DRESSINGS) ×1
BENZOIN TINCTURE PRP APPL 2/3 (GAUZE/BANDAGES/DRESSINGS) ×1 IMPLANT
CLOTH BEACON ORANGE TIMEOUT ST (SAFETY) ×2 IMPLANT
DRESSING TELFA 8X3 (GAUZE/BANDAGES/DRESSINGS) IMPLANT
DRSG COVADERM 4X10 (GAUZE/BANDAGES/DRESSINGS) ×1 IMPLANT
ELECT REM PT RETURN 9FT ADLT (ELECTROSURGICAL) ×2
ELECTRODE REM PT RTRN 9FT ADLT (ELECTROSURGICAL) ×1 IMPLANT
EXTRACTOR VACUUM M CUP 4 TUBE (SUCTIONS) IMPLANT
GAUZE SPONGE 4X4 12PLY STRL LF (GAUZE/BANDAGES/DRESSINGS) ×4 IMPLANT
GLOVE BIO SURGEON STRL SZ7 (GLOVE) ×4 IMPLANT
GLOVE SKINSENSE NS SZ7.5 (GLOVE) ×1
GLOVE SKINSENSE NS SZ8.0 LF (GLOVE) ×1
GLOVE SKINSENSE STRL SZ7.5 (GLOVE) IMPLANT
GLOVE SKINSENSE STRL SZ8.0 LF (GLOVE) IMPLANT
GOWN PREVENTION PLUS LG XLONG (DISPOSABLE) ×4 IMPLANT
KIT ABG SYR 3ML LUER SLIP (SYRINGE) ×2 IMPLANT
NDL HYPO 25X5/8 SAFETYGLIDE (NEEDLE) ×1 IMPLANT
NEEDLE HYPO 25X5/8 SAFETYGLIDE (NEEDLE) ×2 IMPLANT
NS IRRIG 1000ML POUR BTL (IV SOLUTION) ×2 IMPLANT
PACK C SECTION WH (CUSTOM PROCEDURE TRAY) ×2 IMPLANT
PAD ABD 7.5X8 STRL (GAUZE/BANDAGES/DRESSINGS) IMPLANT
SLEEVE SCD COMPRESS KNEE MED (MISCELLANEOUS) ×1 IMPLANT
STAPLER VISISTAT 35W (STAPLE) IMPLANT
STRIP CLOSURE SKIN 1/2X4 (GAUZE/BANDAGES/DRESSINGS) ×1 IMPLANT
STRIP CLOSURE SKIN 1/4X4 (GAUZE/BANDAGES/DRESSINGS) ×2 IMPLANT
SUT CHROMIC 0 CT 1 (SUTURE) IMPLANT
SUT CHROMIC 0 CTX 36 (SUTURE) ×4 IMPLANT
SUT CHROMIC 2 0 SH (SUTURE) IMPLANT
SUT PDS AB 0 CT 36 (SUTURE) ×2 IMPLANT
SUT PLAIN 0 NONE (SUTURE) IMPLANT
SUT PLAIN 2 0 (SUTURE) ×2
SUT PLAIN 2 0 XLH (SUTURE) IMPLANT
SUT PLAIN ABS 2-0 CT1 27XMFL (SUTURE) IMPLANT
SUT VIC AB 3-0 CT1 27 (SUTURE) ×2
SUT VIC AB 3-0 CT1 TAPERPNT 27 (SUTURE) ×1 IMPLANT
SUT VIC AB 4-0 KS 27 (SUTURE) ×1 IMPLANT
TOWEL OR 17X24 6PK STRL BLUE (TOWEL DISPOSABLE) ×4 IMPLANT
TRAY FOLEY CATH 14FR (SET/KITS/TRAYS/PACK) ×2 IMPLANT
WATER STERILE IRR 1000ML POUR (IV SOLUTION) ×2 IMPLANT

## 2012-06-29 NOTE — Progress Notes (Signed)
cx 8/C/+1 per nurse check FHT reactive Epidural in

## 2012-06-29 NOTE — Anesthesia Preprocedure Evaluation (Addendum)
Anesthesia Evaluation  Patient identified by MRN, date of birth, ID band Patient awake    Reviewed: Allergy & Precautions, H&P , Patient's Chart, lab work & pertinent test results  Airway Mallampati: II TM Distance: >3 FB Neck ROM: full    Dental  (+) Teeth Intact   Pulmonary  breath sounds clear to auscultation        Cardiovascular hypertension, Pt. on medications Rhythm:regular Rate:Normal     Neuro/Psych    GI/Hepatic   Endo/Other  Morbid obesity  Renal/GU      Musculoskeletal   Abdominal   Peds  Hematology   Anesthesia Other Findings       Reproductive/Obstetrics (+) Pregnancy                         Anesthesia Physical Anesthesia Plan  ASA: II  Anesthesia Plan: Epidural   Post-op Pain Management:    Induction:   Airway Management Planned:   Additional Equipment:   Intra-op Plan:   Post-operative Plan:   Informed Consent: I have reviewed the patients History and Physical, chart, labs and discussed the procedure including the risks, benefits and alternatives for the proposed anesthesia with the patient or authorized representative who has indicated his/her understanding and acceptance.   Dental Advisory Given  Plan Discussed with:   Anesthesia Plan Comments: (Labs checked- platelets confirmed with RN in room. Fetal heart tracing, per RN, reported to be stable enough for sitting procedure. Discussed epidural, and patient consents to the procedure:  included risk of possible headache,backache, failed block, allergic reaction, and nerve injury. This patient was asked if she had any questions or concerns before the procedure started. )        Anesthesia Quick Evaluation

## 2012-06-29 NOTE — H&P (Addendum)
[name] is a 26 y.o. female at 83 weeks presenting for induction of labor.  Prenatal course complicated by chronic hypertension for which the patient is on aldomet 250 mg tid.  Seen on 8/12 with  bp of 164/92 PIH labs were ok. Presents now for induction.  GBS negative. Maternal Medical History:  Fetal activity: Perceived fetal activity is normal.    Prenatal Complications - Diabetes: none.    OB History    Grav Para Term Preterm Abortions TAB SAB Ect Mult Living   1              Past Medical History  Diagnosis Date  . HEADACHE, CLUSTER, CHRONIC 02/06/2010  . Allergic rhinitis due to pollen 02/06/2010  . Esophageal reflux 11/29/2008  . Hypertension    Past Surgical History  Procedure Date  . No past surgeries    Family History: family history is not on file. Social History:  reports that she has never smoked. She does not have any smokeless tobacco history on file. She reports that she does not drink alcohol or use illicit drugs.   Prenatal Transfer Tool  Maternal Diabetes: No Genetic Screening: Normal Maternal Ultrasounds/Referrals: Normal Fetal Ultrasounds or other Referrals:  None Maternal Substance Abuse:  no Significant Maternal Medications:  Meds include: Other: see prenatal record Significant Maternal Lab Results:  none Other Comments:  chronic hypertension on aldomet  Review of Systems  All other systems reviewed and are negative.    Dilation: 1.5 Effacement (%): 50 Station: -3 Exam by:: H.Norton, RN  Blood pressure 146/88, pulse 89, temperature 98.1 F (36.7 C), temperature source Oral, resp. rate 20, height 5\' 4"  (1.626 m), weight 93.441 kg (206 lb), last menstrual period 12/25/2011, unknown if currently breastfeeding. Maternal Exam:  Uterine Assessment: Contraction frequency is regular.   Abdomen: Patient reports no abdominal tenderness. Fundal height is c/w dates.   Estimated fetal weight is 7.5.   Fetal presentation: vertex  Pelvis: adequate for  delivery.   Cervix: Cervix evaluated by digital exam.     Physical Exam  Prenatal labs: ABO, Rh: --/--/O POS (08/14 1955) Antibody: NEG (08/14 1955) Rubella: Immune (02/27 0000) RPR: NON REACTIVE (08/14 1955)  HBsAg: Negative (02/27 0000)  HIV: Non-reactive (02/27 0000)  GBS: Negative (08/14 0000)   Assessment/Plan: Intrauterine pregnancy at 39 weeks with chronic hypertension cytotec tonight   [name] S 06/29/2012, 5:21 AM

## 2012-06-29 NOTE — Progress Notes (Signed)
Pushing with good effort, no cervical change, vertex at 0 between pushes Mat Temp 100.2 ax FHT + accels, some variable decels  A: arrest of descent  P: Recommend cesarean section     D/W patient/FOB/family above and risks including infection, bleeding/transfusion-HIV/Hep, organ damage, DVT/PE, pneumonia. All questions answered.

## 2012-06-29 NOTE — Brief Op Note (Signed)
06/28/2012 - 06/29/2012  9:55 PM  PATIENT:  [name]  26 y.o. female  PRE-OPERATIVE DIAGNOSIS:  Arrest of descent  POST-OPERATIVE DIAGNOSIS:  Arrest of descent  PROCEDURE:  Procedure(s) (LRB): CESAREAN SECTION (N/A)  SURGEON:  Surgeon(s) and Role:    * Leslie Andrea, MD - Primary  PHYSICIAN ASSISTANT:   ASSISTANTS: none   ANESTHESIA:   epidural  EBL:  Total I/O In: 2000 [I.V.:2000] Out: 1075 [Urine:175; Blood:900]  BLOOD ADMINISTERED:none  DRAINS: Urinary Catheter (Foley)   LOCAL MEDICATIONS USED:  NONE  SPECIMEN:  Source of Specimen:  placenta  DISPOSITION OF SPECIMEN:  PATHOLOGY  COUNTS:  YES  TOURNIQUET:  * No tourniquets in log *  DICTATION: .Other Dictation: Dictation Number [id number]  PLAN OF CARE: Admit to inpatient   PATIENT DISPOSITION:  PACU - hemodynamically stable.   Delay start of Pharmacological VTE agent (>24hrs) due to surgical blood loss or risk of bleeding: not applicable

## 2012-06-29 NOTE — Anesthesia Procedure Notes (Signed)
Epidural Patient location during procedure: OB  Preanesthetic Checklist Completed: patient identified, site marked, surgical consent, pre-op evaluation, timeout performed, IV checked, risks and benefits discussed and monitors and equipment checked  Epidural Patient position: sitting Prep: site prepped and draped and DuraPrep Patient monitoring: continuous pulse ox and blood pressure Approach: midline Injection technique: LOR air  Needle:  Needle type: Tuohy  Needle gauge: 17 G Needle length: 9 cm Needle insertion depth: 5 cm cm Catheter type: closed end flexible Catheter size: 19 Gauge Catheter at skin depth: 12 cm Test dose: negative  Assessment Events: blood not aspirated, injection not painful, no injection resistance, negative IV test and no paresthesia  Additional Notes Dosing of Epidural:  1st dose, through needle ............................................Marland Kitchen epi 1:200K + Xylocaine 40 mg  2nd dose, through catheter, after waiting 3 minutes...Marland KitchenMarland Kitchenepi 1:200K + Xylocaine 40 mg  3rd dose, through catheter after waiting 3 minutes .............................Marcaine   4mg    ( mg Marcaine are expressed as equivilent  cc's medication removed from the 0.1%Bupiv / fentanyl syringe from L&D pump)  ( 2% Xylo charted as a single dose in Epic Meds for ease of charting; actual dosing was fractionated as above, for saftey's sake)  As each dose occurred, patient was free of IV sx; and patient exhibited no evidence of SA injection.  Patient is more comfortable after epidural dosed. Please see RN's note for documentation of vital signs,and FHR which are stable.  Patient reminded not to try to ambulate with numb legs, and that an RN must be present the 1st time she attempts to get up.

## 2012-06-29 NOTE — Progress Notes (Signed)
No C/O FHT reactive UCs irregular Cx 2/70/-3 per nurse check Begin pitocin D/W patient

## 2012-06-29 NOTE — Transfer of Care (Signed)
Immediate Anesthesia Transfer of Care Note  Patient: [name]  Procedure(s) Performed: Procedure(s) (LRB): CESAREAN SECTION (N/A)  Patient Location: PACU  Anesthesia Type: Epidural  Level of Consciousness: awake  Airway & Oxygen Therapy: Patient Spontanous Breathing  Post-op Assessment: Report given to PACU RN and Post -op Vital signs reviewed and stable  Post vital signs: stable  Complications: No apparent anesthesia complications

## 2012-06-29 NOTE — Progress Notes (Signed)
Cx C/C -1 FHT reactive Begin second stage

## 2012-06-29 NOTE — Anesthesia Postprocedure Evaluation (Signed)
Anesthesia Post Note  Patient: [name]  Procedure(s) Performed: Procedure(s) (LRB): CESAREAN SECTION (N/A)  Anesthesia type: Epidural  Patient location: PACU  Post pain: Pain level controlled  Post assessment: Post-op Vital signs reviewed  Last Vitals:  Filed Vitals:   06/29/12 2031  BP: 155/89  Pulse: 97  Temp:   Resp: 20    Post vital signs: Reviewed  Level of consciousness: awake  Complications: No apparent anesthesia complications

## 2012-06-30 LAB — CBC
HCT: 23.6 % — ABNORMAL LOW (ref 36.0–46.0)
MCH: 29.3 pg (ref 26.0–34.0)
MCV: 83.4 fL (ref 78.0–100.0)
Platelets: 199 10*3/uL (ref 150–400)
RDW: 14.5 % (ref 11.5–15.5)

## 2012-06-30 MED ORDER — SODIUM CHLORIDE 0.9 % IV SOLN
1.0000 ug/kg/h | INTRAVENOUS | Status: DC | PRN
  Filled 2012-06-30: qty 2.5

## 2012-06-30 MED ORDER — ACETAMINOPHEN 10 MG/ML IV SOLN
1000.0000 mg | Freq: Four times a day (QID) | INTRAVENOUS | Status: AC | PRN
  Filled 2012-06-30: qty 100

## 2012-06-30 MED ORDER — ONDANSETRON HCL 4 MG PO TABS: 4.0000 mg | ORAL_TABLET | ORAL | Status: DC | PRN

## 2012-06-30 MED ORDER — LANOLIN HYDROUS EX OINT: 1.0000 "application " | TOPICAL_OINTMENT | CUTANEOUS | Status: DC | PRN

## 2012-06-30 MED ORDER — WITCH HAZEL-GLYCERIN EX PADS: 1.0000 "application " | MEDICATED_PAD | CUTANEOUS | Status: DC | PRN

## 2012-06-30 MED ORDER — METHYLDOPA 250 MG PO TABS
250.0000 mg | ORAL_TABLET | Freq: Two times a day (BID) | ORAL | Status: DC
  Administered 2012-06-30 – 2012-07-02 (×5): 250 mg via ORAL
  Filled 2012-06-30 (×7): qty 1

## 2012-06-30 MED ORDER — SIMETHICONE 80 MG PO CHEW
80.0000 mg | CHEWABLE_TABLET | Freq: Three times a day (TID) | ORAL | Status: DC
  Administered 2012-06-30 – 2012-07-02 (×8): 80 mg via ORAL

## 2012-06-30 MED ORDER — NALOXONE HCL 0.4 MG/ML IJ SOLN: 0.4000 mg | INTRAMUSCULAR | Status: DC | PRN

## 2012-06-30 MED ORDER — METOCLOPRAMIDE HCL 5 MG/ML IJ SOLN: 10.0000 mg | Freq: Three times a day (TID) | INTRAMUSCULAR | Status: DC | PRN

## 2012-06-30 MED ORDER — PRENATAL MULTIVITAMIN CH
1.0000 | ORAL_TABLET | Freq: Every day | ORAL | Status: DC
  Administered 2012-06-30 – 2012-07-02 (×3): 1 via ORAL
  Filled 2012-06-30 (×3): qty 1

## 2012-06-30 MED ORDER — DIPHENHYDRAMINE HCL 25 MG PO CAPS: 25.0000 mg | ORAL_CAPSULE | Freq: Four times a day (QID) | ORAL | Status: DC | PRN

## 2012-06-30 MED ORDER — NALBUPHINE HCL 10 MG/ML IJ SOLN
5.0000 mg | INTRAMUSCULAR | Status: DC | PRN
  Filled 2012-06-30: qty 1

## 2012-06-30 MED ORDER — DIBUCAINE 1 % RE OINT: 1.0000 "application " | TOPICAL_OINTMENT | RECTAL | Status: DC | PRN

## 2012-06-30 MED ORDER — SIMETHICONE 80 MG PO CHEW: 80.0000 mg | CHEWABLE_TABLET | ORAL | Status: DC | PRN

## 2012-06-30 MED ORDER — DIPHENHYDRAMINE HCL 50 MG/ML IJ SOLN: 25.0000 mg | INTRAMUSCULAR | Status: DC | PRN

## 2012-06-30 MED ORDER — SODIUM CHLORIDE 0.9 % IJ SOLN: 3.0000 mL | INTRAMUSCULAR | Status: DC | PRN

## 2012-06-30 MED ORDER — IBUPROFEN 600 MG PO TABS
600.0000 mg | ORAL_TABLET | Freq: Four times a day (QID) | ORAL | Status: DC
  Administered 2012-06-30 – 2012-07-02 (×10): 600 mg via ORAL
  Filled 2012-06-30 (×10): qty 1

## 2012-06-30 MED ORDER — OXYCODONE-ACETAMINOPHEN 5-325 MG PO TABS
1.0000 | ORAL_TABLET | ORAL | Status: DC | PRN
  Administered 2012-06-30 – 2012-07-02 (×5): 1 via ORAL
  Filled 2012-06-30 (×5): qty 1

## 2012-06-30 MED ORDER — ONDANSETRON HCL 4 MG/2ML IJ SOLN: 4.0000 mg | INTRAMUSCULAR | Status: DC | PRN

## 2012-06-30 MED ORDER — OXYTOCIN 40 UNITS IN LACTATED RINGERS INFUSION - SIMPLE MED: 62.5000 mL/h | INTRAVENOUS | Status: AC

## 2012-06-30 MED ORDER — LACTATED RINGERS IV SOLN
INTRAVENOUS | Status: DC
  Administered 2012-06-30: 09:00:00 via INTRAVENOUS

## 2012-06-30 MED ORDER — DIPHENHYDRAMINE HCL 25 MG PO CAPS: 25.0000 mg | ORAL_CAPSULE | ORAL | Status: DC | PRN

## 2012-06-30 MED ORDER — MENTHOL 3 MG MT LOZG: 1.0000 | LOZENGE | OROMUCOSAL | Status: DC | PRN

## 2012-06-30 MED ORDER — TETANUS-DIPHTH-ACELL PERTUSSIS 5-2.5-18.5 LF-MCG/0.5 IM SUSP: 0.5000 mL | Freq: Once | INTRAMUSCULAR | Status: DC

## 2012-06-30 MED ORDER — ZOLPIDEM TARTRATE 5 MG PO TABS: 5.0000 mg | ORAL_TABLET | Freq: Every evening | ORAL | Status: DC | PRN

## 2012-06-30 MED ORDER — SENNOSIDES-DOCUSATE SODIUM 8.6-50 MG PO TABS
2.0000 | ORAL_TABLET | Freq: Every day | ORAL | Status: DC
  Administered 2012-06-30 – 2012-07-01 (×2): 2 via ORAL

## 2012-06-30 MED ORDER — ONDANSETRON HCL 4 MG/2ML IJ SOLN: 4.0000 mg | Freq: Three times a day (TID) | INTRAMUSCULAR | Status: DC | PRN

## 2012-06-30 MED ORDER — DIPHENHYDRAMINE HCL 50 MG/ML IJ SOLN: 12.5000 mg | INTRAMUSCULAR | Status: DC | PRN

## 2012-06-30 NOTE — Addendum Note (Signed)
Addendum  created 06/30/12 1025 by Wilder Glade, CRNA   Modules edited:Notes Section

## 2012-06-30 NOTE — Progress Notes (Signed)
Subjective: Postpartum Day 1: Cesarean Delivery Patient reports tolerating PO and + flatus.    Objective: Vital signs in last 24 hours: Temp:  [98.4 F (36.9 C)-100.2 F (37.9 C)] 99.1 F (37.3 C) (08/16 0749) Pulse Rate:  [83-117] 85  (08/16 0749) Resp:  [16-33] 20  (08/16 0749) BP: (125-172)/(67-101) 150/86 mmHg (08/16 0749) SpO2:  [97 %-100 %] 99 % (08/16 0749)  Physical Exam:  General: alert and cooperative Lochia: appropriate Uterine Fundus: firm Incision: abd dressing noted with old drainage noted on bandage DVT Evaluation: No evidence of DVT seen on physical exam. DTR 1+  Basename 06/30/12 0500 06/28/12 1955  HGB 8.2* 11.2*  HCT 23.6* 32.2*    Assessment/Plan: Status post Cesarean section. Postoperative course complicated by [facility]  Continue current care.  Ski Polich G 06/30/2012, 8:17 AM

## 2012-06-30 NOTE — Anesthesia Postprocedure Evaluation (Signed)
  Anesthesia Post-op Note  Patient: [name]  Procedure(s) Performed: Procedure(s) (LRB): CESAREAN SECTION (N/A)  Patient Location: PACU and Mother/Baby  Anesthesia Type: Spinal  Level of Consciousness: awake, alert  and oriented  Airway and Oxygen Therapy: Patient Spontanous Breathing  Post-op Pain: none  Post-op Assessment: Post-op Vital signs reviewed, Patient's Cardiovascular Status Stable, Respiratory Function Stable, Patent Airway, No signs of Nausea or vomiting, Pain level controlled, No headache, No backache, No residual numbness and No residual motor weakness  Post-op Vital Signs: Reviewed and stable  Complications: No apparent anesthesia complications

## 2012-06-30 NOTE — Op Note (Signed)
[name], [name]                ACCOUNT NO.:  [id number]  MEDICAL RECORD NO.:  [id number]  LOCATION:  9148                          FACILITY:  [facility]  PHYSICIAN:  Guy Sandifer. Henderson Cloud, M.D. DATE OF BIRTH:  13-Jan-1986  DATE OF PROCEDURE:  06/29/2012 DATE OF DISCHARGE:                              OPERATIVE REPORT   PREOPERATIVE DIAGNOSIS:  Arrest of descent.  POSTOPERATIVE DIAGNOSIS:  Arrest of descent.  PROCEDURE:  Primary low transverse cesarean section.  SURGEON:  Guy Sandifer. Henderson Cloud, MD  ANESTHESIA:  Epidural, Brayton Caves, MD.  ESTIMATED BLOOD LOSS:  750 mL.  FINDINGS:  Viable infant, Apgars of 4 7 and 8 at 1, 5, and 10 minutes respectively.  Arterial cord pH 7.31.  Birth weight pending.  INDICATION AND CONSENT:  This patient is a 26 year old, G1, P0, at 39th 1/7th weeks who presents in labor.  She progresses to complete pushing. After pushing for approximately 3 hours and 15 minutes, the vertex remained at approximately 0 between pushes with caput noted.  They are intermittent variable decelerations.  Maternal temperature was 100.2 axillary.  Cesarean section was recommended.  Potential risks and complications were reviewed with the patient preoperatively including not limited to, infection, organ damage, bleeding requiring transfusion of blood products with HIV and hepatitis acquisition, DVT, PE, and pneumonia.  All questions were answered and consent is signed on the chart.  PROCEDURE:  The patient was taken to the operating room where she was identified.  Epidural anesthetic was augmented to surgical level.  She was placed in dorsal supine position with a 15 degree left lateral wedge.  Time-out undertaken.  She was then prepped Foley catheter was placed in the bladder to drain.  She was draped in sterile fashion. After testing for adequate epidural anesthesia, skin was entered through a Pfannenstiel incision and dissection carried out in layers to the peritoneum.   Peritoneum was entered and extended superiorly and inferiorly.  The vesicouterine peritoneum was extended cephalad laterally and the bladder flap was developed.  The bladder blade was placed.  Uterus was then incised in a low transverse manner.  The uterine cavity was entered bluntly with a hemostat.  The uterine incision was extended cephalad laterally with fingers.  The vertex was then delivered with the aid of a elevating the hand from below. Remainder of baby was then delivered.  Cord was clamped and cut.  The baby was handed to the awaiting pediatrics team.  After exchanging for clean gloves, the placenta is manually delivered and sent to Pathology. Uterus was cleaned.  Uterus was closed in 2 running locking imbricating layers of 0 Monocryl suture.  In doing this, the uterus was delivered so that the backside of the mesosalpinx can be visualized and palpated. There is a slight extension of the incision into the left lower segment. This was reapproximated with the same sutures as described above, as well as additional figure-of-eight of 0 Monocryl.  This achieves good hemostasis.  There are 2 were 3 approximately 3-cm subserosal fibroids noted as well.  Tubes and ovaries were normal.  Uterus was returned to the peritoneal cavity.  Irrigation was carried out.  Careful inspection reveals all the  sites to be hemostatic.  The anterior peritoneum was then closed in a running fashion with 0 Monocryl suture which was also used to reapproximate the pyramidalis muscle in midline.  Anterior rectus fascia was closed in running fashion with a 0 looped PDS suture. Subcutaneous tissues were closed with interrupted plain suture and the skin was closed with a subcuticular 4-0 Vicryl on a Keith needle.  All counts correct.  The patient was transferred to recovery room in stable condition.     Guy Sandifer Henderson Cloud, M.D.     JET/MEDQ  D:  06/29/2012  T:  06/30/2012  Job:  [id number]

## 2012-06-30 NOTE — Progress Notes (Signed)
UR chart review completed.  

## 2012-07-01 NOTE — Addendum Note (Signed)
Addendum  created 07/01/12 0820 by Lincoln Brigham, CRNA   Modules edited:Notes Section

## 2012-07-01 NOTE — Progress Notes (Signed)
Subjective: Postpartum Day 2: Cesarean Delivery Patient reports tolerating PO, + flatus and no problems voiding.   Desires circ.  R/b/a discussed, informed consent  Objective: Vital signs in last 24 hours: Temp:  [97.6 F (36.4 C)-99 F (37.2 C)] 97.6 F (36.4 C) (08/17 0640) Pulse Rate:  [76-99] 76  (08/17 0640) Resp:  [18-20] 20  (08/17 0640) BP: (124-141)/(68-83) 131/70 mmHg (08/17 0640) SpO2:  [98 %] 98 % (08/16 1645)  Physical Exam:  General: alert and cooperative Lochia: appropriate Uterine Fundus: firm Incision: healing well DVT Evaluation: No evidence of DVT seen on physical exam.   Basename 06/30/12 0500 06/28/12 1955  HGB 8.2* 11.2*  HCT 23.6* 32.2*    Assessment/Plan: Status post Cesarean section. Doing well postoperatively.  Continue current care.  PO meds for pain today  [name] 07/01/2012, 8:37 AM

## 2012-07-01 NOTE — Anesthesia Postprocedure Evaluation (Signed)
  Anesthesia Post-op Note  Patient: [name]  Procedure(s) Performed: Procedure(s) (LRB): CESAREAN SECTION (N/A)  Patient Location: Mother/Baby  Anesthesia Type: Epidural  Level of Consciousness: awake, alert  and oriented  Airway and Oxygen Therapy: Patient Spontanous Breathing  Post-op Pain: mild  Post-op Assessment: Patient's Cardiovascular Status Stable, Respiratory Function Stable, Patent Airway, No signs of Nausea or vomiting and Pain level controlled  Post-op Vital Signs: stable  Complications: No apparent anesthesia complications

## 2012-07-02 MED ORDER — IBUPROFEN 600 MG PO TABS: 600.0000 mg | ORAL_TABLET | Freq: Four times a day (QID) | ORAL | Status: AC

## 2012-07-02 MED ORDER — BISACODYL 10 MG RE SUPP
10.0000 mg | Freq: Once | RECTAL | Status: DC
  Filled 2012-07-02: qty 1

## 2012-07-02 MED ORDER — FLEET ENEMA 7-19 GM/118ML RE ENEM: 1.0000 | ENEMA | Freq: Once | RECTAL | Status: DC

## 2012-07-02 MED ORDER — OXYCODONE-ACETAMINOPHEN 5-325 MG PO TABS: 1.0000 | ORAL_TABLET | ORAL | Status: AC | PRN

## 2012-07-02 NOTE — Discharge Summary (Signed)
Obstetric Discharge Summary Reason for Admission: induction of labor Prenatal Procedures: ultrasound Intrapartum Procedures: cesarean: low cervical, transverse Postpartum Procedures: none Complications-Operative and Postpartum: none Hemoglobin  Date Value Range Status  06/30/2012 8.2* 12.0 - 15.0 g/dL Final     REPEATED TO VERIFY     DELTA CHECK NOTED     HCT  Date Value Range Status  06/30/2012 23.6* 36.0 - 46.0 % Final    Physical Exam:  General: alert and cooperative Lochia: appropriate Uterine Fundus: firm Incision: healing well DVT Evaluation: No evidence of DVT seen on physical exam.  Discharge Diagnoses: Term Pregnancy-delivered  Discharge Information: Date: 07/02/2012 Activity: pelvic rest Diet: routine Medications: PNV, Ibuprofen and Percocet Condition: stable Instructions: refer to practice specific booklet Discharge to: home Follow-up Information    Schedule an appointment as soon as possible for a visit in 1 week to follow up.         Newborn Data: Live born female  Birth Weight: 7 lb 4.6 oz (3305 g) APGAR: 4, 7  Home with mother.  [name] 07/02/2012, 8:12 AM

## 2012-07-03 ENCOUNTER — Encounter (HOSPITAL_COMMUNITY): Payer: Self-pay | Admitting: Obstetrics and Gynecology

## 2012-08-11 ENCOUNTER — Ambulatory Visit: Payer: Medicaid Other | Admitting: Family

## 2012-08-14 ENCOUNTER — Encounter: Payer: Self-pay | Admitting: Family

## 2012-08-14 ENCOUNTER — Ambulatory Visit (INDEPENDENT_AMBULATORY_CARE_PROVIDER_SITE_OTHER): Payer: Medicaid Other | Admitting: Family

## 2012-08-14 VITALS — BP 150/110 | HR 86 | Temp 98.6°F | Wt 188.0 lb

## 2012-08-14 DIAGNOSIS — I10 Essential (primary) hypertension: Secondary | ICD-10-CM

## 2012-08-14 MED ORDER — TRIAMTERENE-HCTZ 37.5-25 MG PO TABS: 1.0000 | ORAL_TABLET | Freq: Every day | ORAL | Status: DC

## 2012-08-14 NOTE — Patient Instructions (Addendum)

## 2012-08-14 NOTE — Progress Notes (Signed)
  Subjective:    Patient ID: [name], female    DOB: 1986/06/18, 26 y.o.   MRN: [id number]  HPI 26 year old [place] American female, nonsmoker, 6 weeks postpartum patient of Dr. Lovell Sheehan is in with uncontrolled hypertension. She's been off of blood pressure medication since her pregnancy. She is now on oral contraceptive pills. She denies any headache, lightheadedness, dizziness, chest pain, palpitations, shortness of breath or edema. Patient is not nursing.   Review of Systems  Constitutional: Negative.   Respiratory: Negative.   Cardiovascular: Negative.   Musculoskeletal: Negative.   Skin: Negative.   Neurological: Negative.   Hematological: Negative.   Psychiatric/Behavioral: Negative.    Past Medical History  Diagnosis Date  . HEADACHE, CLUSTER, CHRONIC 02/06/2010  . Allergic rhinitis due to pollen 02/06/2010  . Esophageal reflux 11/29/2008  . Hypertension     History   Social History  . Marital Status: Single    Spouse Name: N/A    Number of Children: N/A  . Years of Education: N/A   Occupational History  . Not on file.   Social History Main Topics  . Smoking status: Never Smoker   . Smokeless tobacco: Not on file  . Alcohol Use: No  . Drug Use: No  . Sexually Active: Yes   Other Topics Concern  . Not on file   Social History Narrative  . No narrative on file    Past Surgical History  Procedure Date  . No past surgeries   . Cesarean section 06/29/2012    Procedure: CESAREAN SECTION;  Surgeon: Leslie Andrea, MD;  Location: [facility];  Service: Obstetrics;  Laterality: N/A;    No family history on file.  No Known Allergies  Current Outpatient Prescriptions on File Prior to Visit  Medication Sig Dispense Refill  . Prenatal Vit-Fe Fumarate-FA (PRENATAL MULTIVITAMIN) TABS Take 1 tablet by mouth every morning.       . methyldopa (ALDOMET) 250 MG tablet Take 1 tablet (250 mg total) by mouth 2 (two) times daily.  60 tablet  0  .  triamterene-hydrochlorothiazide (MAXZIDE-25) 37.5-25 MG per tablet Take 1 each (1 tablet total) by mouth daily.  30 tablet  3    BP 150/110  Pulse 86  Temp 98.6 F (37 C) (Oral)  Wt 188 lb (85.276 kg)  SpO2 99%  Breastfeeding? Nochart    Objective:   Physical Exam  Constitutional: She is oriented to person, place, and time. She appears well-developed and well-nourished.  Neck: Normal range of motion. Neck supple. No thyromegaly present.  Cardiovascular: Normal rate, regular rhythm and normal heart sounds.  Exam reveals no gallop.   No murmur heard. Pulmonary/Chest: Effort normal and breath sounds normal.  Neurological: She is alert and oriented to person, place, and time.  Skin: Skin is warm and dry.  Psychiatric: She has a normal mood and affect.          Assessment & Plan:  Assessment: Hypertension-uncontrolled, 6 weeks postpartum  Plan: Restart Maxzide 37.5/25 once daily. Recheck in 2 weeks. Encouraged healthy diet and exercise. Call the office with any questions or concerns.

## 2012-08-28 ENCOUNTER — Encounter: Payer: Self-pay | Admitting: Family

## 2012-08-28 ENCOUNTER — Ambulatory Visit (INDEPENDENT_AMBULATORY_CARE_PROVIDER_SITE_OTHER): Payer: Medicaid Other | Admitting: Family

## 2012-08-28 VITALS — BP 146/90 | HR 86 | Temp 98.6°F

## 2012-08-28 DIAGNOSIS — I10 Essential (primary) hypertension: Secondary | ICD-10-CM

## 2012-08-28 DIAGNOSIS — R51 Headache: Secondary | ICD-10-CM

## 2012-08-28 MED ORDER — VERAPAMIL HCL ER 180 MG PO TBCR: 180.0000 mg | EXTENDED_RELEASE_TABLET | Freq: Every day | ORAL | Status: DC

## 2012-08-28 NOTE — Patient Instructions (Addendum)

## 2012-08-28 NOTE — Progress Notes (Signed)
Subjective:    Patient ID: [name], female    DOB: 18-Nov-1985, 26 y.o.   MRN: [id number]  Hypertension This is a chronic problem. The current episode started more than 1 year ago. The problem has been gradually improving since onset. The problem is uncontrolled. Associated symptoms include headaches. Risk factors for coronary artery disease include family history. Past treatments include ACE inhibitors and diuretics. The current treatment provides mild improvement. Compliance problems include diet.   26 year old AAF seen today for follow up for uncontrolled hypertension. Pt was started on maxzide 37.5 mg. Pt reports dizziness and lightheadedness starting this morning, along with hot flashes. Headache is diminished once HTN medication is taken. Pt reports she is bottle feeding, has  poor eating habits, exercises 3-4 times per week, and monitoring BP 2-3 times a week at home.    Review of Systems  Constitutional: Negative.   HENT:       Headache  Eyes: Negative.   Respiratory: Negative.   Cardiovascular: Negative.   Gastrointestinal: Negative.   Genitourinary: Negative.   Musculoskeletal: Negative.   Skin: Negative.   Neurological: Positive for light-headedness and headaches.       Objective:   Physical Exam  Constitutional: She is oriented to person, place, and time. She appears well-developed and well-nourished.  HENT:  Head: Normocephalic.  Right Ear: External ear normal.  Left Ear: External ear normal.  Eyes: Pupils are equal, round, and reactive to light.  Neck: Normal range of motion. Neck supple.  Cardiovascular: Normal rate and regular rhythm.   Pulmonary/Chest: Effort normal and breath sounds normal.  Abdominal: Soft. Bowel sounds are normal.  Musculoskeletal: Normal range of motion.  Neurological: She is alert and oriented to person, place, and time.  Skin: Skin is warm and dry.  Psychiatric: She has a normal mood and affect. Her behavior is normal. Judgment and  thought content normal.    Past Medical History  Diagnosis Date  . HEADACHE, CLUSTER, CHRONIC 02/06/2010  . Allergic rhinitis due to pollen 02/06/2010  . Esophageal reflux 11/29/2008  . Hypertension     History   Social History  . Marital Status: Single    Spouse Name: N/A    Number of Children: N/A  . Years of Education: N/A   Occupational History  . Not on file.   Social History Main Topics  . Smoking status: Never Smoker   . Smokeless tobacco: Not on file  . Alcohol Use: No  . Drug Use: No  . Sexually Active: Yes   Other Topics Concern  . Not on file   Social History Narrative  . No narrative on file    Past Surgical History  Procedure Date  . No past surgeries   . Cesarean section 06/29/2012    Procedure: CESAREAN SECTION;  Surgeon: Leslie Andrea, MD;  Location: [facility];  Service: Obstetrics;  Laterality: N/A;    No family history on file.  No Known Allergies  Current Outpatient Prescriptions on File Prior to Visit  Medication Sig Dispense Refill  . methyldopa (ALDOMET) 250 MG tablet Take 1 tablet (250 mg total) by mouth 2 (two) times daily.  60 tablet  0  . Prenatal Vit-Fe Fumarate-FA (PRENATAL MULTIVITAMIN) TABS Take 1 tablet by mouth every morning.       . triamterene-hydrochlorothiazide (MAXZIDE-25) 37.5-25 MG per tablet Take 1 each (1 tablet total) by mouth daily.  30 tablet  3  . verapamil (CALAN-SR) 180 MG CR tablet Take 1  tablet (180 mg total) by mouth at bedtime.  30 tablet  4    BP 146/90  Pulse 86  Temp 98.6 F (37 C) (Oral)  SpO2 98%chart      Assessment & Plan:     Assessment Uncontrolled Hypertension, Headache  Plan Continue Maxzide 37.5-25mg  and add Verapamil 180 mg daily at night. Pt to call in 2 weeks to report BP readings and is symptoms persist or become worst to return to office

## 2013-12-12 ENCOUNTER — Encounter (HOSPITAL_COMMUNITY): Payer: Self-pay | Admitting: Emergency Medicine

## 2013-12-12 ENCOUNTER — Emergency Department (HOSPITAL_COMMUNITY)
Admission: EM | Admit: 2013-12-12 | Discharge: 2013-12-12 | Disposition: A | Discharge status: Home / Self Care | Payer: 59 | Source: Home / Self Care | Attending: Family Medicine | Admitting: Family Medicine

## 2013-12-12 DIAGNOSIS — J069 Acute upper respiratory infection, unspecified: Secondary | ICD-10-CM

## 2013-12-12 DIAGNOSIS — B9789 Other viral agents as the cause of diseases classified elsewhere: Principal | ICD-10-CM

## 2013-12-12 MED ORDER — PREDNISONE 10 MG PO TABS: 30.0000 mg | ORAL_TABLET | Freq: Every day | ORAL | Status: DC

## 2013-12-12 MED ORDER — GUAIFENESIN-CODEINE 100-10 MG/5ML PO SOLN: 5.0000 mL | Freq: Every evening | ORAL | Status: DC | PRN

## 2013-12-12 MED ORDER — IPRATROPIUM BROMIDE 0.06 % NA SOLN: 2.0000 | Freq: Four times a day (QID) | NASAL | Status: DC

## 2013-12-12 NOTE — Discharge Instructions (Signed)
Thank you for coming in today. Use codeine containing cough medication at bedtime as needed. Take prednisone daily for 5 days. Use Atrovent nasal spray as needed for runny nose. Call or go to the emergency room if you get worse, have trouble breathing, have chest pains, or palpitations.  Your blood pressure is somewhat elevated. Consider restarting your blood pressure medication. Followup with your primary care provider.

## 2013-12-12 NOTE — ED Notes (Signed)
C/o cold sx which started Sunday morning States she has a dry cough, congestion, head pressure and pain and runny nose Mucinex, dayquil and Nyquil has been taking as tx but no relief.

## 2013-12-12 NOTE — ED Provider Notes (Signed)
[name] is a 28 y.o. female who presents to Urgent Care today for 4 days of cough congestion runny nose and sore throat. Patient also notes a mild headache. She's tried Mucinex DayQuil and NyQuil which have not worked for her much. She denies any significant shortness of breath chest pain palpitations nausea vomiting or diarrhea. She feels well otherwise.   Past Medical History  Diagnosis Date  . HEADACHE, CLUSTER, CHRONIC 02/06/2010  . Allergic rhinitis due to pollen 02/06/2010  . Esophageal reflux 11/29/2008  . Hypertension    History  Substance Use Topics  . Smoking status: Never Smoker   . Smokeless tobacco: Not on file  . Alcohol Use: No   ROS as above Medications: No current facility-administered medications for this encounter.   Current Outpatient Prescriptions  Medication Sig Dispense Refill  . guaiFENesin-codeine 100-10 MG/5ML syrup Take 5 mLs by mouth at bedtime as needed for cough.  120 mL  0  . ipratropium (ATROVENT) 0.06 % nasal spray Place 2 sprays into both nostrils 4 (four) times daily.  15 mL  1  . predniSONE (DELTASONE) 10 MG tablet Take 3 tablets (30 mg total) by mouth daily.  15 tablet  0  . Prenatal Vit-Fe Fumarate-FA (PRENATAL MULTIVITAMIN) TABS Take 1 tablet by mouth every morning.       . triamterene-hydrochlorothiazide (MAXZIDE-25) 37.5-25 MG per tablet Take 1 each (1 tablet total) by mouth daily.  30 tablet  3  . verapamil (CALAN-SR) 180 MG CR tablet Take 1 tablet (180 mg total) by mouth at bedtime.  30 tablet  4  . [DISCONTINUED] methyldopa (ALDOMET) 250 MG tablet Take 1 tablet (250 mg total) by mouth 2 (two) times daily.  60 tablet  0    Exam:  BP 151/91  Pulse 85  Temp(Src) 99.4 F (37.4 C) (Oral)  Resp 18  Ht 5\' 4"  (1.626 m)  Wt 165 lb (74.844 kg)  BMI 28.31 kg/m2  SpO2 100%  LMP 12/02/2013 Gen: Well NAD HEENT: EOMI,  MMM posterior pharynx with cobblestoning.. Tympanic membranes are normal appearing bilaterally Lungs: Normal work of  breathing. CTABL Heart: RRR no MRG Abd: NABS, Soft. NT, ND Exts: Brisk capillary refill, warm and well perfused.   No results found for this or any previous visit (from the past 24 hour(s)). No results found.  Assessment and Plan: 28 y.o. female with viral illness including pharyngitis. Plan to treat with prednisone, Atrovent nasal spray, and codeine containing cough medication.  Discussed warning signs or symptoms. Please see discharge instructions. Patient expresses understanding.    Gregor Hams, MD 12/12/13 [phone]

## 2013-12-16 ENCOUNTER — Encounter (HOSPITAL_COMMUNITY): Payer: Self-pay | Admitting: Emergency Medicine

## 2013-12-16 ENCOUNTER — Emergency Department (INDEPENDENT_AMBULATORY_CARE_PROVIDER_SITE_OTHER)
Admission: EM | Admit: 2013-12-16 | Discharge: 2013-12-16 | Disposition: A | Discharge status: Home / Self Care | Payer: 59 | Source: Home / Self Care | Attending: Emergency Medicine | Admitting: Emergency Medicine

## 2013-12-16 DIAGNOSIS — I10 Essential (primary) hypertension: Secondary | ICD-10-CM

## 2013-12-16 DIAGNOSIS — J019 Acute sinusitis, unspecified: Secondary | ICD-10-CM

## 2013-12-16 DIAGNOSIS — J069 Acute upper respiratory infection, unspecified: Secondary | ICD-10-CM

## 2013-12-16 MED ORDER — AMOXICILLIN-POT CLAVULANATE 875-125 MG PO TABS: 1.0000 | ORAL_TABLET | Freq: Two times a day (BID) | ORAL | Status: DC

## 2013-12-16 NOTE — ED Provider Notes (Signed)
Medical screening examination/treatment/procedure(s) were performed by a resident physician and as supervising physician I was immediately available for consultation/collaboration.  Philipp Deputy, M.D.  Harden Mo, MD 12/16/13 [phone]

## 2013-12-16 NOTE — ED Notes (Signed)
Pt returns today with worsening sinusitis sx. Increased facial pressure on left side with throb headache unrelieved by medication prescribed 1/28 @ [facility]. Pt also c/o body aches,chills. BP 161/112 states she hasn't filled script at this time.

## 2013-12-16 NOTE — ED Provider Notes (Signed)
CSN: [id number]     Arrival date & time 12/16/13  1742 History   First MD Initiated Contact with Patient 12/16/13 1807     Chief Complaint  Patient presents with  . Facial Pain  . Hypertension  . URI   (Consider location/radiation/quality/duration/timing/severity/associated sxs/prior Treatment) HPI 8 day hx of sinus pain and congestion. Seen on 12/12/12 and diagnosed with viral illness and prescribed prednisone, atrovent nasal spray,and codeine. The pt has been taking these and feels that symptoms are getting worse. She has subjective fever and chills, fatigue, and headache. No shortness of breath or chest pain.   HTN - pt has hx of htn and takes verapmil and triamterence-HCTZ but has not taken either today; also stating decongestant; no chest pain or SOB  Past Medical History  Diagnosis Date  . HEADACHE, CLUSTER, CHRONIC 02/06/2010  . Allergic rhinitis due to pollen 02/06/2010  . Esophageal reflux 11/29/2008  . Hypertension    Past Surgical History  Procedure Laterality Date  . No past surgeries    . Cesarean section  06/29/2012    Procedure: CESAREAN SECTION;  Surgeon: Allena Katz, MD;  Location: [facility];  Service: Obstetrics;  Laterality: N/A;   No family history on file. History  Substance Use Topics  . Smoking status: Never Smoker   . Smokeless tobacco: Not on file  . Alcohol Use: No   OB History   Grav Para Term Preterm Abortions TAB SAB Ect Mult Living   1 1 1  0 0 0 0 0 0 1     Review of Systems See HPI Allergies  Review of patient's allergies indicates no known allergies.  Home Medications   Current Outpatient Rx  Name  Route  Sig  Dispense  Refill  . amoxicillin-clavulanate (AUGMENTIN) 875-125 MG per tablet   Oral   Take 1 tablet by mouth 2 (two) times daily.   14 tablet   0   . guaiFENesin-codeine 100-10 MG/5ML syrup   Oral   Take 5 mLs by mouth at bedtime as needed for cough.   120 mL   0   . ipratropium (ATROVENT) 0.06 % nasal spray   Each  Nare   Place 2 sprays into both nostrils 4 (four) times daily.   15 mL   1   . predniSONE (DELTASONE) 10 MG tablet   Oral   Take 3 tablets (30 mg total) by mouth daily.   15 tablet   0   . Prenatal Vit-Fe Fumarate-FA (PRENATAL MULTIVITAMIN) TABS   Oral   Take 1 tablet by mouth every morning.          . triamterene-hydrochlorothiazide (MAXZIDE-25) 37.5-25 MG per tablet   Oral   Take 1 each (1 tablet total) by mouth daily.   30 tablet   3   . verapamil (CALAN-SR) 180 MG CR tablet   Oral   Take 1 tablet (180 mg total) by mouth at bedtime.   30 tablet   4    BP 161/112  Pulse 71  Temp(Src) 99.1 F (37.3 C) (Oral)  Resp 18  SpO2 100%  LMP 12/02/2013 Physical Exam YQM:VHQIO adult female, ill appearing but non toxic HEENT: NCAT, PERRLA, EOMI, OP clear and moist no tonsillar exuate, mild right submanidibular lymphadenopathy, marked tenderness to palpation of left frontal and maxillary sinuses  CV: RRR, no m/r/g, no JVD or carotid bruits Pulm: normal WOB, CTA-B   ED Course  Procedures (including critical care time) Labs Review Labs Reviewed -  No data to display Imaging Review No results found.    MDM   1. Acute rhinosinusitis   2. Poorly-controlled hypertension    Sinusitis - Given worsening severity and duration of symptoms, I think it is appropriate to treat as if a bacterial rhinosinusitis Will given augmentin 875-125 mg BID x 7 days.   HTN - pt needs to be compliant with her meds and stop decongestant; no acute issues so will follow up with PCP    Angelica Ran, MD 12/16/13 [phone]

## 2013-12-16 NOTE — Discharge Instructions (Signed)
Dear [name],   Given the duration and severity of your symptoms, I am concerned abuot a bacterial sinus infection. You should start taking augmentin twice a day for 7 days and complete the entire course. Stop the medication immediately and consult your physician if there is any reaction to the medication.   I hope that you feel better soon.   Sincerely,   Dr. Maricela Bo

## 2013-12-18 NOTE — ED Notes (Signed)
Accessed record to create work note at patient's request

## 2014-07-02 ENCOUNTER — Ambulatory Visit: Payer: Self-pay | Admitting: Physician Assistant

## 2014-07-02 LAB — URINALYSIS, COMPLETE

## 2014-07-04 LAB — URINE CULTURE

## 2014-08-22 ENCOUNTER — Other Ambulatory Visit: Payer: Self-pay | Admitting: Obstetrics and Gynecology

## 2014-08-26 LAB — CYTOLOGY - PAP

## 2014-09-16 ENCOUNTER — Encounter (HOSPITAL_COMMUNITY): Payer: Self-pay | Admitting: Emergency Medicine

## 2015-10-23 ENCOUNTER — Ambulatory Visit: Payer: Self-pay | Admitting: Family Medicine

## 2015-11-06 ENCOUNTER — Ambulatory Visit: Payer: Self-pay | Admitting: Family Medicine

## 2016-03-27 ENCOUNTER — Inpatient Hospital Stay (HOSPITAL_COMMUNITY)
Admission: EM | Admit: 2016-03-27 | Discharge: 2016-04-01 | DRG: 060 | Disposition: A | Discharge status: Home / Self Care | Payer: 59 | Attending: Internal Medicine | Admitting: Internal Medicine

## 2016-03-27 DIAGNOSIS — R200 Anesthesia of skin: Secondary | ICD-10-CM | POA: Diagnosis not present

## 2016-03-27 DIAGNOSIS — G35 Multiple sclerosis: Secondary | ICD-10-CM | POA: Diagnosis not present

## 2016-03-27 DIAGNOSIS — R202 Paresthesia of skin: Secondary | ICD-10-CM

## 2016-03-27 DIAGNOSIS — I10 Essential (primary) hypertension: Secondary | ICD-10-CM | POA: Diagnosis present

## 2016-03-27 DIAGNOSIS — G44009 Cluster headache syndrome, unspecified, not intractable: Secondary | ICD-10-CM | POA: Diagnosis present

## 2016-03-27 DIAGNOSIS — K219 Gastro-esophageal reflux disease without esophagitis: Secondary | ICD-10-CM | POA: Diagnosis present

## 2016-03-27 DIAGNOSIS — G43909 Migraine, unspecified, not intractable, without status migrainosus: Secondary | ICD-10-CM | POA: Diagnosis present

## 2016-03-27 NOTE — ED Notes (Signed)
Pt states that she is having numbness to her rt arm; pt denies injury to her arm; pt states that her arm was normal and then around 5 pm it went numb; no weakness noted; pt had a difficult time gripping but was able to do it; pt c/o decreased sensation when palpating arm

## 2016-03-28 ENCOUNTER — Emergency Department (HOSPITAL_COMMUNITY): Payer: 59

## 2016-03-28 ENCOUNTER — Encounter (HOSPITAL_COMMUNITY): Payer: Self-pay | Admitting: *Deleted

## 2016-03-28 DIAGNOSIS — K219 Gastro-esophageal reflux disease without esophagitis: Secondary | ICD-10-CM | POA: Diagnosis present

## 2016-03-28 DIAGNOSIS — G35 Multiple sclerosis: Secondary | ICD-10-CM | POA: Insufficient documentation

## 2016-03-28 DIAGNOSIS — I10 Essential (primary) hypertension: Secondary | ICD-10-CM | POA: Diagnosis present

## 2016-03-28 DIAGNOSIS — G44009 Cluster headache syndrome, unspecified, not intractable: Secondary | ICD-10-CM | POA: Diagnosis present

## 2016-03-28 DIAGNOSIS — R200 Anesthesia of skin: Secondary | ICD-10-CM | POA: Diagnosis present

## 2016-03-28 DIAGNOSIS — R51 Headache: Secondary | ICD-10-CM | POA: Diagnosis not present

## 2016-03-28 LAB — COMPREHENSIVE METABOLIC PANEL
ALK PHOS: 55 U/L (ref 38–126)
ALT: 18 U/L (ref 14–54)
AST: 19 U/L (ref 15–41)
Albumin: 4.4 g/dL (ref 3.5–5.0)
Anion gap: 9 (ref 5–15)
BUN: 11 mg/dL (ref 6–20)
CALCIUM: 9.8 mg/dL (ref 8.9–10.3)
CHLORIDE: 110 mmol/L (ref 101–111)
CO2: 21 mmol/L — ABNORMAL LOW (ref 22–32)
CREATININE: 0.84 mg/dL (ref 0.44–1.00)
Glucose, Bld: 111 mg/dL — ABNORMAL HIGH (ref 65–99)
Potassium: 3.8 mmol/L (ref 3.5–5.1)
Sodium: 140 mmol/L (ref 135–145)
Total Bilirubin: 0.3 mg/dL (ref 0.3–1.2)
Total Protein: 8 g/dL (ref 6.5–8.1)

## 2016-03-28 LAB — CBC WITH DIFFERENTIAL/PLATELET
Basophils Absolute: 0 10*3/uL (ref 0.0–0.1)
Basophils Relative: 0 %
EOS PCT: 2 %
Eosinophils Absolute: 0.2 10*3/uL (ref 0.0–0.7)
HCT: 35.5 % — ABNORMAL LOW (ref 36.0–46.0)
Hemoglobin: 12.4 g/dL (ref 12.0–15.0)
LYMPHS ABS: 3.7 10*3/uL (ref 0.7–4.0)
Lymphocytes Relative: 53 %
MCH: 29.4 pg (ref 26.0–34.0)
MCHC: 34.9 g/dL (ref 30.0–36.0)
MCV: 84.1 fL (ref 78.0–100.0)
Monocytes Absolute: 0.5 10*3/uL (ref 0.1–1.0)
Monocytes Relative: 8 %
Neutro Abs: 2.7 10*3/uL (ref 1.7–7.7)
Neutrophils Relative %: 37 %
Platelets: 286 10*3/uL (ref 150–400)
RBC: 4.22 MIL/uL (ref 3.87–5.11)
RDW: 13.7 % (ref 11.5–15.5)
WBC: 7.1 10*3/uL (ref 4.0–10.5)

## 2016-03-28 LAB — CBG MONITORING, ED: GLUCOSE-CAPILLARY: 88 mg/dL (ref 65–99)

## 2016-03-28 LAB — APTT: APTT: 39 s — AB (ref 24–37)

## 2016-03-28 LAB — URINALYSIS, ROUTINE W REFLEX MICROSCOPIC
Bilirubin Urine: NEGATIVE
Glucose, UA: NEGATIVE mg/dL
HGB URINE DIPSTICK: NEGATIVE
KETONES UR: NEGATIVE mg/dL
Leukocytes, UA: NEGATIVE
Nitrite: NEGATIVE
PH: 6 (ref 5.0–8.0)
Protein, ur: NEGATIVE mg/dL
SPECIFIC GRAVITY, URINE: 1.023 (ref 1.005–1.030)

## 2016-03-28 LAB — HEPATIC FUNCTION PANEL
ALBUMIN: 4.6 g/dL (ref 3.5–5.0)
ALT: 17 U/L (ref 14–54)
AST: 19 U/L (ref 15–41)
Alkaline Phosphatase: 51 U/L (ref 38–126)
BILIRUBIN TOTAL: 0.5 mg/dL (ref 0.3–1.2)
Bilirubin, Direct: <0.1 mg/dL — ABNORMAL LOW (ref 0.1–0.5)
Total Protein: 8.4 g/dL — ABNORMAL HIGH (ref 6.5–8.1)

## 2016-03-28 LAB — PREGNANCY, URINE: PREG TEST UR: NEGATIVE

## 2016-03-28 LAB — GLUCOSE, CAPILLARY: GLUCOSE-CAPILLARY: 149 mg/dL — AB (ref 65–99)

## 2016-03-28 LAB — PROTIME-INR
INR: 1.21 (ref 0.00–1.49)
PROTHROMBIN TIME: 15.5 s — AB (ref 11.6–15.2)

## 2016-03-28 MED ORDER — SODIUM CHLORIDE 0.9 % IV SOLN
250.0000 mg | Freq: Four times a day (QID) | INTRAVENOUS | Status: DC
  Administered 2016-03-28: 250 mg via INTRAVENOUS
  Filled 2016-03-28: qty 2
  Filled 2016-03-28: qty 250
  Filled 2016-03-28: qty 4

## 2016-03-28 MED ORDER — PANTOPRAZOLE SODIUM 40 MG PO TBEC
40.0000 mg | DELAYED_RELEASE_TABLET | Freq: Every day | ORAL | Status: DC
  Administered 2016-03-28 – 2016-04-01 (×5): 40 mg via ORAL
  Filled 2016-03-28 (×5): qty 1

## 2016-03-28 MED ORDER — OXYCODONE HCL 5 MG PO TABS
5.0000 mg | ORAL_TABLET | ORAL | Status: DC | PRN
  Administered 2016-03-29: 5 mg via ORAL
  Filled 2016-03-28: qty 1

## 2016-03-28 MED ORDER — LORAZEPAM 1 MG PO TABS
1.0000 mg | ORAL_TABLET | Freq: Once | ORAL | Status: AC
  Administered 2016-03-28: 1 mg via ORAL
  Filled 2016-03-28: qty 1

## 2016-03-28 MED ORDER — ONDANSETRON HCL 4 MG/2ML IJ SOLN: 4.0000 mg | Freq: Four times a day (QID) | INTRAMUSCULAR | Status: DC | PRN

## 2016-03-28 MED ORDER — SODIUM CHLORIDE 0.9 % IV SOLN
INTRAVENOUS | Status: DC
  Administered 2016-03-28: 13:00:00 via INTRAVENOUS

## 2016-03-28 MED ORDER — ACETAMINOPHEN 325 MG PO TABS
650.0000 mg | ORAL_TABLET | Freq: Four times a day (QID) | ORAL | Status: DC | PRN
  Administered 2016-03-30 – 2016-04-01 (×5): 650 mg via ORAL
  Filled 2016-03-28 (×5): qty 2

## 2016-03-28 MED ORDER — SODIUM CHLORIDE 0.9% FLUSH
3.0000 mL | Freq: Two times a day (BID) | INTRAVENOUS | Status: DC
  Administered 2016-03-28 – 2016-04-01 (×6): 3 mL via INTRAVENOUS

## 2016-03-28 MED ORDER — ONDANSETRON HCL 4 MG PO TABS: 4.0000 mg | ORAL_TABLET | Freq: Four times a day (QID) | ORAL | Status: DC | PRN

## 2016-03-28 MED ORDER — ACETAMINOPHEN 650 MG RE SUPP: 650.0000 mg | Freq: Four times a day (QID) | RECTAL | Status: DC | PRN

## 2016-03-28 MED ORDER — SODIUM CHLORIDE 0.9 % IV SOLN
250.0000 mg | Freq: Four times a day (QID) | INTRAVENOUS | Status: DC
  Administered 2016-03-28 – 2016-04-01 (×15): 250 mg via INTRAVENOUS
  Filled 2016-03-28: qty 2
  Filled 2016-03-28 (×2): qty 250
  Filled 2016-03-28: qty 2
  Filled 2016-03-28: qty 250
  Filled 2016-03-28: qty 2
  Filled 2016-03-28 (×5): qty 250
  Filled 2016-03-28 (×2): qty 2
  Filled 2016-03-28: qty 250
  Filled 2016-03-28 (×7): qty 2

## 2016-03-28 NOTE — ED Notes (Signed)
Patient transported to MRI 

## 2016-03-28 NOTE — ED Provider Notes (Signed)
CSN: [id number]     Arrival date & time 03/27/16  2323 History  By signing my name below, I, [facility], attest that this documentation has been prepared under the direction and in the presence of Rolland Porter, MD at 01:40 AM. Electronically Signed: Virgel Bouquet, ED Scribe. 03/28/2016. 3:35 AM.   Chief Complaint  Patient presents with  . Numbness    The history is provided by the patient. No language interpreter was used.  HPI Comments: [name] is a 30 y.o. female who presents to the Emergency Department complaining of constant, mild, unchanging right arm numbness that extends from her mid-bicep and extending to her third, fourth, and fifth fingers onset 2 weeks ago. She reports difficulty using and writing with her right hand secondary to tingling and weakness, frontal, pressure HAs for the past week. She notes similar symptoms in the past that resolved without treatment. Mother reports that the pt has an hx of cluster HAs and has had 3 MRIs in the past with Dr. Trenton Gammon who advised that the pt follow-up for evaluation of possible multiple sclerosis. She has an appointment on June 15. Per pt, she has been sleeping less secondary to work but denies recent injuries or heavy lifting.  Denies taking medications regularly. Denies cigarette smoking and frequent ETOH consumption. Denies neck pain or any other symptoms. Pt is right hand dominant.  PCP: Dr. Garret Reddish   Past Medical History  Diagnosis Date  . HEADACHE, CLUSTER, CHRONIC 02/06/2010  . Allergic rhinitis due to pollen 02/06/2010  . Esophageal reflux 11/29/2008  . Hypertension    Past Surgical History  Procedure Laterality Date  . No past surgeries    . Cesarean section  06/29/2012    Procedure: CESAREAN SECTION;  Surgeon: Allena Katz, MD;  Location: [facility];  Service: Obstetrics;  Laterality: N/A;   No family history on file. Social History  Substance Use Topics  . Smoking status: Never Smoker   .  Smokeless tobacco: None  . Alcohol Use: Yes     Comment: occ   employed  OB History    Gravida Para Term Preterm AB TAB SAB Ectopic Multiple Living   1 1 1  0 0 0 0 0 0 1     Review of Systems  Musculoskeletal: Negative for neck pain.  Neurological: Positive for weakness, numbness and headaches.  All other systems reviewed and are negative.  Allergies  Review of patient's allergies indicates no known allergies.  Home Medications   Prior to Admission medications   Medication Sig Start Date End Date Taking? Authorizing Provider  fluticasone (FLONASE) 50 MCG/ACT nasal spray Place 1 spray into both nostrils daily as needed for allergies or rhinitis.   Yes Historical Provider, MD  Prenatal Vit-Fe Fumarate-FA (PRENATAL MULTIVITAMIN) TABS Take 1 tablet by mouth every morning.    Yes Historical Provider, MD  amoxicillin-clavulanate (AUGMENTIN) 875-125 MG per tablet Take 1 tablet by mouth 2 (two) times daily. Patient not taking: Reported on 03/28/2016 12/16/13   Angelica Ran, MD  guaiFENesin-codeine 100-10 MG/5ML syrup Take 5 mLs by mouth at bedtime as needed for cough. Patient not taking: Reported on 03/28/2016 12/12/13   Gregor Hams, MD  ipratropium (ATROVENT) 0.06 % nasal spray Place 2 sprays into both nostrils 4 (four) times daily. Patient not taking: Reported on 03/28/2016 12/12/13   Gregor Hams, MD  predniSONE (DELTASONE) 10 MG tablet Take 3 tablets (30 mg total) by mouth daily. Patient not taking: Reported  on 03/28/2016 12/12/13   Gregor Hams, MD  triamterene-hydrochlorothiazide (MAXZIDE-25) 37.5-25 MG per tablet Take 1 each (1 tablet total) by mouth daily. Patient not taking: Reported on 03/28/2016 08/14/12   Kennyth Arnold, FNP  verapamil (CALAN-SR) 180 MG CR tablet Take 1 tablet (180 mg total) by mouth at bedtime. Patient not taking: Reported on 03/28/2016 08/28/12   Kennyth Arnold, FNP   BP 166/102 mmHg  Pulse 106  Temp(Src) 98.2 F (36.8 C) (Oral)  Resp 18  Ht 5\' 5"  (1.651  m)  Wt 179 lb (81.194 kg)  BMI 29.79 kg/m2  SpO2 100%  LMP 03/08/2016  Vital signs normal   Physical Exam  Constitutional: She is oriented to person, place, and time. She appears well-developed and well-nourished.  Non-toxic appearance. She does not appear ill. No distress.  HENT:  Head: Normocephalic and atraumatic.  Right Ear: External ear normal.  Left Ear: External ear normal.  Nose: Nose normal. No mucosal edema or rhinorrhea.  Mouth/Throat: Oropharynx is clear and moist and mucous membranes are normal. No dental abscesses or uvula swelling.  Eyes: Conjunctivae and EOM are normal. Pupils are equal, round, and reactive to light.  Neck: Normal range of motion and full passive range of motion without pain. Neck supple.  Cardiovascular: Normal rate, regular rhythm and normal heart sounds.  Exam reveals no gallop and no friction rub.   No murmur heard. Pulmonary/Chest: Effort normal and breath sounds normal. No respiratory distress. She has no wheezes. She has no rhonchi. She has no rales. She exhibits no tenderness and no crepitus.  Abdominal: Soft. Normal appearance and bowel sounds are normal. She exhibits no distension. There is no tenderness. There is no rebound and no guarding.  Musculoskeletal: Normal range of motion. She exhibits no edema or tenderness.  Moves all extremities well.   Neurological: She is alert and oriented to person, place, and time. She has normal strength. No cranial nerve deficit.  Grips equal bilaterally. No pronator drift. On sensation exam with light touch, she has change in sensation on the dorsal aspect of her right upper arm, forearm, and third, fourth, and fifth fingers compared to the left arm.  Skin: Skin is warm, dry and intact. No rash noted. No erythema. No pallor.  Psychiatric: She has a normal mood and affect. Her speech is normal and behavior is normal. Her mood appears not anxious.  Nursing note and vitals reviewed.   ED Course  Procedures   Medications  LORazepam (ATIVAN) tablet 1 mg (not administered)   DIAGNOSTIC STUDIES: Oxygen Saturation is 100% on RA, normal by my interpretation.    COORDINATION OF CARE: 1:41 AM Will order x-ray of neck to check for arthritis which could cause radiculopathy.. Will  Discussed treatment plan with pt at bedside and pt agreed to plan.  On review of patient's prior study she had a head CT in 2011 was negative however she had a MRI of the brain done twice in 2011 and then again in 2012 showing a abnormal area in the right area of her brain that was suspicious for possible MS lesion. Since the CT scan was normal with the abnormal MRI CT scan of the head was not done in the ED tonight. My concern is with the numbness in her right arm this could be a symptom of MS. MRI of needed to make that diagnosis. If she does have MS she will need to get IV steroids and referral to a neurologist.   Unfortunately  MRI is not here tonight. I was debating whether to send patient over to [facility] to get MRI of her brain or just have her wait here. I talked to the MRI tech at [facility] she states there is on-call person today at [facility] available at 7 AM.  PT is aware of waiting to get the MRI done here this morning.   Pre-op sedation ordered for MRI.   Pt left at change of shift with Dr Stark Jock to get MRI result.   Labs Review Results for orders placed or performed during the hospital encounter of 03/27/16  CBC with Differential  Result Value Ref Range   WBC 7.1 4.0 - 10.5 K/uL   RBC 4.22 3.87 - 5.11 MIL/uL   Hemoglobin 12.4 12.0 - 15.0 g/dL   HCT 35.5 (L) 36.0 - 46.0 %   MCV 84.1 78.0 - 100.0 fL   MCH 29.4 26.0 - 34.0 pg   MCHC 34.9 30.0 - 36.0 g/dL   RDW 13.7 11.5 - 15.5 %   Platelets 286 150 - 400 K/uL   Neutrophils Relative % 37 %   Neutro Abs 2.7 1.7 - 7.7 K/uL   Lymphocytes Relative 53 %   Lymphs Abs 3.7 0.7 - 4.0 K/uL   Monocytes Relative 8 %   Monocytes Absolute 0.5 0.1 - 1.0 K/uL   Eosinophils  Relative 2 %   Eosinophils Absolute 0.2 0.0 - 0.7 K/uL   Basophils Relative 0 %   Basophils Absolute 0.0 0.0 - 0.1 K/uL  Comprehensive metabolic panel  Result Value Ref Range   Sodium 140 135 - 145 mmol/L   Potassium 3.8 3.5 - 5.1 mmol/L   Chloride 110 101 - 111 mmol/L   CO2 21 (L) 22 - 32 mmol/L   Glucose, Bld 111 (H) 65 - 99 mg/dL   BUN 11 6 - 20 mg/dL   Creatinine, Ser 0.84 0.44 - 1.00 mg/dL   Calcium 9.8 8.9 - 10.3 mg/dL   Total Protein 8.0 6.5 - 8.1 g/dL   Albumin 4.4 3.5 - 5.0 g/dL   AST 19 15 - 41 U/L   ALT 18 14 - 54 U/L   Alkaline Phosphatase 55 38 - 126 U/L   Total Bilirubin 0.3 0.3 - 1.2 mg/dL   GFR calc non Af Amer >60 >60 mL/min   GFR calc Af Amer >60 >60 mL/min   Anion gap 9 5 - 15  Urinalysis, Routine w reflex microscopic (not at [facility])  Result Value Ref Range   Color, Urine YELLOW YELLOW   APPearance CLEAR CLEAR   Specific Gravity, Urine 1.023 1.005 - 1.030   pH 6.0 5.0 - 8.0   Glucose, UA NEGATIVE NEGATIVE mg/dL   Hgb urine dipstick NEGATIVE NEGATIVE   Bilirubin Urine NEGATIVE NEGATIVE   Ketones, ur NEGATIVE NEGATIVE mg/dL   Protein, ur NEGATIVE NEGATIVE mg/dL   Nitrite NEGATIVE NEGATIVE   Leukocytes, UA NEGATIVE NEGATIVE  Pregnancy, urine  Result Value Ref Range   Preg Test, Ur NEGATIVE NEGATIVE   Laboratory interpretation all normal   Imaging Review Dg Cervical Spine Complete  03/28/2016  CLINICAL DATA:  Right upper extremity paresthesias for 2 weeks, nontraumatic. EXAM: CERVICAL SPINE - COMPLETE 4+ VIEW COMPARISON:  None. FINDINGS: The cervical vertebrae are normal in height. There is slight reversal of cervical lordosis. There is no fracture. There is no acute soft tissue abnormality. Neural foramina are widely patent. Good preservation of intervertebral disc spaces. No bone lesion or bony destruction. IMPRESSION: Negative  cervical spine radiographs. Electronically Signed   By: Andreas Newport M.D.   On: 03/28/2016 02:23   MRI brain  normal  2012 RADIOLOGY REPORT*  Clinical Data: Headaches. Follow-up brain lesion. Leg spasms.  MRI HEAD WITHOUT CONTRAST   Comparison: 10/11/2010 most recent MRI.  Findings: There is no evidence for acute infarction, intracranial hemorrhage, mass lesion, hydrocephalus, or extra-axial fluid . No atrophy is present. The major intracranial vascular structures are widely patent.  Redemonstrated is an irregularly shaped 11 x 12 x 5 mm right lateral periatrial white matter lesion unchanged from 2011. Its location along a heavily myelinated white matter pathway (optic radiation) raises the question of multiple sclerosis. There are no other similar areas of abnormal white matter signal in the brain.  IMPRESSION: Stable apparently solitary intracranial white matter lesion affecting the right lateral periatrial region. See comments above.  Original Report Authenticated By: Staci Righter, M.D.   I have personally reviewed and evaluated these images and lab results as part of my medical decision-making.    MDM   Final diagnoses:  Numbness and tingling of right arm   Disposition pending  Rolland Porter, MD, FACEP   I personally performed the services described in this documentation, which was scribed in my presence. The recorded information has been reviewed and considered.  Rolland Porter, MD, Barbette Or, MD 03/28/16 [phone]

## 2016-03-28 NOTE — Progress Notes (Signed)
Pt transferred from [facility] to unit via [facility] x 3. Pt alert and oriented upon arrival. No complaints of pain or discomfort. No signs or symptoms of acute distress. Pt connected to telemetry and central monitoring notified. Pt oriented to unit as well as unit procedures. Pt now resting in bed at lowest position, bed alarm on, call light in reach. Will continue to monitor. Fortino Sic, RN, BSN 03/28/2016 10:07 PM

## 2016-03-28 NOTE — H&P (Signed)
History and Physical    [name] [id number] DOB: 02-Nov-1986 DOA: 03/27/2016  PCP: Garret Reddish, MD Patient coming from: Home  Chief Complaint: Right arm numbness and weakness  HPI: [name] is a 30 y.o. female with medical history significant of cluster headaches, presenting to the emergency department with complaints of right upper extremity numbness and tingling associate with weakness becoming progressively worse over the past 2 weeks. She reports being unable to carry objects with her right hand, with the muscle strength of digits #3 4 and 5 significantly affected. She denies any other focal neurological deficits or visual changes. She states occasionally having "shaking" to her lower extremities however without associated weakness. She denies shortness of breath, chest pain, abdominal pain, fevers, chills. She has a history of a solitary white matter lesion seen on MRI of brain in 2012. At the time radiology raised the question of this representing multiple sclerosis.  ED Course: In the emergency department she had a repeat MRI of brain without contrast that revealed markedly progression of white matter lesions throughout the brain since the 2012 study, radiology reporting highly consistent with multiple sclerosis. Emergency room provider discussed case with Dr.Nandigam of neurology recommending initiating steroid therapy with Solu-Medrol 250 mg IV every 6 hours. Neurology also recommending lumbar puncture and transferred to [facility] for further workup.  Review of Systems: As per HPI otherwise 10 point review of systems negative.    Past Medical History  Diagnosis Date  . HEADACHE, CLUSTER, CHRONIC 02/06/2010  . Allergic rhinitis due to pollen 02/06/2010  . Esophageal reflux 11/29/2008  . Hypertension     Past Surgical History  Procedure Laterality Date  . No past surgeries    . Cesarean section  06/29/2012    Procedure: CESAREAN SECTION;   Surgeon: Allena Katz, MD;  Location: [facility];  Service: Obstetrics;  Laterality: N/A;     reports that she has never smoked. She does not have any smokeless tobacco history on file. She reports that she drinks alcohol. She reports that she does not use illicit drugs.  No Known Allergies  Family history She has a family history of hypertension and diabetes on her mother's side  Prior to Admission medications   Medication Sig Start Date End Date Taking? Authorizing Provider  fluticasone (FLONASE) 50 MCG/ACT nasal spray Place 1 spray into both nostrils daily as needed for allergies or rhinitis.   Yes Historical Provider, MD  Prenatal Vit-Fe Fumarate-FA (PRENATAL MULTIVITAMIN) TABS Take 1 tablet by mouth every morning.    Yes Historical Provider, MD  amoxicillin-clavulanate (AUGMENTIN) 875-125 MG per tablet Take 1 tablet by mouth 2 (two) times daily. Patient not taking: Reported on 03/28/2016 12/16/13   Angelica Ran, MD  guaiFENesin-codeine 100-10 MG/5ML syrup Take 5 mLs by mouth at bedtime as needed for cough. Patient not taking: Reported on 03/28/2016 12/12/13   Gregor Hams, MD  ipratropium (ATROVENT) 0.06 % nasal spray Place 2 sprays into both nostrils 4 (four) times daily. Patient not taking: Reported on 03/28/2016 12/12/13   Gregor Hams, MD  predniSONE (DELTASONE) 10 MG tablet Take 3 tablets (30 mg total) by mouth daily. Patient not taking: Reported on 03/28/2016 12/12/13   Gregor Hams, MD  triamterene-hydrochlorothiazide (MAXZIDE-25) 37.5-25 MG per tablet Take 1 each (1 tablet total) by mouth daily. Patient not taking: Reported on 03/28/2016 08/14/12   Kennyth Arnold, FNP  verapamil (CALAN-SR) 180 MG CR tablet Take 1 tablet (180 mg  total) by mouth at bedtime. Patient not taking: Reported on 03/28/2016 08/28/12   Kennyth Arnold, FNP    Physical Exam: Filed Vitals:   03/28/16 0001 03/28/16 0638 03/28/16 0844  BP: 166/102 141/93 141/92  Pulse: 106 84 82  Temp: 98.2 F (36.8 C)  98.8 F (37.1 C) 98.6 F (37 C)  TempSrc: Oral Oral Oral  Resp: 18 17 16   Height: 5\' 5"  (1.651 m)    Weight: 81.194 kg (179 lb)    SpO2: 100% 100% 100%    Constitutional: NAD, calm, comfortable, no acute distress. Filed Vitals:   03/28/16 0001 03/28/16 0638 03/28/16 0844  BP: 166/102 141/93 141/92  Pulse: 106 84 82  Temp: 98.2 F (36.8 C) 98.8 F (37.1 C) 98.6 F (37 C)  TempSrc: Oral Oral Oral  Resp: 18 17 16   Height: 5\' 5"  (1.651 m)    Weight: 81.194 kg (179 lb)    SpO2: 100% 100% 100%   Eyes: PERRL, lids and conjunctivae normal ENMT: Mucous membranes are moist. Posterior pharynx clear of any exudate or lesions.Normal dentition.  Neck: normal, supple, no masses, no thyromegaly Respiratory: clear to auscultation bilaterally, no wheezing, no crackles. Normal respiratory effort. No accessory muscle use.  Cardiovascular: Regular rate and rhythm, no murmurs / rubs / gallops. No extremity edema. 2+ pedal pulses. No carotid bruits.  Abdomen: no tenderness, no masses palpated. No hepatosplenomegaly. Bowel sounds positive.  Musculoskeletal: no clubbing / cyanosis. No joint deformity upper and lower extremities. Good ROM, no contractures. Normal muscle tone.  Skin: no rashes, lesions, ulcers. No induration Neurologic: On neurologic examination she has 3-5 muscle strength to her right upper extremity with numbness at the level of elbow distally to her fingers. She could only grip with her thumb and pointer finger. The rest of her neurologic examination was unremarkable. Psychiatric: Normal judgment and insight. Alert and oriented x 3. Normal mood.    Labs on Admission: I have personally reviewed following labs and imaging studies  CBC:  Recent Labs Lab 03/28/16 0120  WBC 7.1  NEUTROABS 2.7  HGB 12.4  HCT 35.5*  MCV 84.1  PLT Q000111Q   Basic Metabolic Panel:  Recent Labs Lab 03/28/16 0120  NA 140  K 3.8  CL 110  CO2 21*  GLUCOSE 111*  BUN 11  CREATININE 0.84  CALCIUM  9.8   GFR: Estimated Creatinine Clearance: 104.1 mL/min (by C-G formula based on Cr of 0.84). Liver Function Tests:  Recent Labs Lab 03/28/16 0120  AST 19  ALT 18  ALKPHOS 55  BILITOT 0.3  PROT 8.0  ALBUMIN 4.4   No results for input(s): LIPASE, AMYLASE in the last 168 hours. No results for input(s): AMMONIA in the last 168 hours. Coagulation Profile: No results for input(s): INR, PROTIME in the last 168 hours. Cardiac Enzymes: No results for input(s): CKTOTAL, CKMB, CKMBINDEX, TROPONINI in the last 168 hours. BNP (last 3 results) No results for input(s): PROBNP in the last 8760 hours. HbA1C: No results for input(s): HGBA1C in the last 72 hours. CBG: No results for input(s): GLUCAP in the last 168 hours. Lipid Profile: No results for input(s): CHOL, HDL, LDLCALC, TRIG, CHOLHDL, LDLDIRECT in the last 72 hours. Thyroid Function Tests: No results for input(s): TSH, T4TOTAL, FREET4, T3FREE, THYROIDAB in the last 72 hours. Anemia Panel: No results for input(s): VITAMINB12, FOLATE, FERRITIN, TIBC, IRON, RETICCTPCT in the last 72 hours. Urine analysis:    Component Value Date/Time   COLORURINE YELLOW 03/28/2016 0103  COLORURINE ORANGE 07/02/2014 1354   APPEARANCEUR CLEAR 03/28/2016 0103   APPEARANCEUR HAZY 07/02/2014 1354   LABSPEC 1.023 03/28/2016 0103   LABSPEC SEE COMMENT 07/02/2014 1354   PHURINE 6.0 03/28/2016 0103   PHURINE see comment 07/02/2014 1354   GLUCOSEU NEGATIVE 03/28/2016 0103   GLUCOSEU see comment 07/02/2014 1354   HGBUR NEGATIVE 03/28/2016 0103   HGBUR see comment 07/02/2014 1354   BILIRUBINUR NEGATIVE 03/28/2016 0103   BILIRUBINUR SEE COMMENT 07/02/2014 1354   KETONESUR NEGATIVE 03/28/2016 0103   KETONESUR SEE COMMENT 07/02/2014 1354   PROTEINUR NEGATIVE 03/28/2016 0103   PROTEINUR SEE COMMENT 07/02/2014 1354   UROBILINOGEN 0.2 03/17/2012 1600   NITRITE NEGATIVE 03/28/2016 0103   NITRITE SEE COMMENT 07/02/2014 1354   LEUKOCYTESUR NEGATIVE  03/28/2016 0103   LEUKOCYTESUR SEE COMMENT 07/02/2014 1354   Sepsis Labs: !!!!!!!!!!!!!!!!!!!!!!!!!!!!!!!!!!!!!!!!!!!! @LABRCNTIP (procalcitonin:4,lacticidven:4) )No results found for this or any previous visit (from the past 240 hour(s)).   Radiological Exams on Admission: Dg Cervical Spine Complete  03/28/2016  CLINICAL DATA:  Right upper extremity paresthesias for 2 weeks, nontraumatic. EXAM: CERVICAL SPINE - COMPLETE 4+ VIEW COMPARISON:  None. FINDINGS: The cervical vertebrae are normal in height. There is slight reversal of cervical lordosis. There is no fracture. There is no acute soft tissue abnormality. Neural foramina are widely patent. Good preservation of intervertebral disc spaces. No bone lesion or bony destruction. IMPRESSION: Negative cervical spine radiographs. Electronically Signed   By: Andreas Newport M.D.   On: 03/28/2016 02:23   Mr Brain Wo Contrast (neuro Protocol)  03/28/2016  CLINICAL DATA:  Numbness of the right arm and hand beginning 2 weeks ago. EXAM: MRI HEAD WITHOUT CONTRAST TECHNIQUE: Multiplanar, multiecho pulse sequences of the brain and surrounding structures were obtained without intravenous contrast. COMPARISON:  11/03/2011.  10/11/2010. FINDINGS: Diffusion imaging does not show any acute or subacute infarction. Since the previous study, there has been marked progression of white matter lesions with involvement of the left middle cerebellar peduncle and extensively throughout the cerebral hemispheric deep and subcortical white matter. The pattern and distribution are highly suggestive of active multiple sclerosis. Other causes of active demyelination are possible less likely particularly at this age. No neoplastic mass lesion, hemorrhage, hydrocephalus or extra-axial collection. No pituitary mass. No inflammatory sinus disease. Incidental retention cyst left maxillary sinus. No skull or skullbase lesion. Major vessels are patent at the base of the brain. IMPRESSION:  Marked progression of white matter lesions throughout the brain since the study of 2012. The pattern and distribution is highly likely to indicate active multiple sclerosis. Electronically Signed   By: Nelson Chimes M.D.   On: 03/28/2016 09:56    EKG: Independently reviewed.  Assessment/Plan Active Problems:   Multiple sclerosis exacerbation ([place])  1.  Probable multiple sclerosis exacerbation. Ms Rackard is a 30 year old female having a history of cluster headaches and white matter lesion that was seen back on an MRI in 2012. She presents with focal neurological deficits involving her right upper extremity that was further worked up with an MRI of brain in the emergency room. This study revealed progression of white matter lesions with involvement of the left middle cerebellar peduncle and extensively throughout the cerebral hemispheric deep and subcutaneous cortical white matter. Findings highly suggestive of multiple sclerosis. I discussed case with Dr.Nandigam of neurology who recommended she be transferred to [facility]. Also recommended that she be started on Solu-Medrol 250 mg IV every 6 hours as well as undergoing lumbar puncture.    2.  Hypertension. Initially had a blood pressure 166/102 in the emergency department. Coming down so 141/92. Monitor blood pressures consider starting antihypertensive agent.   DVT prophylaxis: Patient undergoing lumbar puncture, will place SCDs for now Code Status: Full code Family Communication: Spoke to her mother Disposition Plan: Transfer to [facility] when bed is available. Will admit to [facility] in the meantime. Consults called: Dr Erick Blinks of neurology Admission status: Inpatient   Kelvin Cellar MD [facility] Pager [phone]  If 7PM-7AM, please contact night-coverage [email or web address] Password [facility]  03/28/2016, 10:36 AM

## 2016-03-28 NOTE — ED Notes (Signed)
Patient denies pain and is resting comfortably. Pt states right arm numbness persists.

## 2016-03-28 NOTE — Progress Notes (Signed)
Pt transferred to 1412.  Report given to Ginger, Therapist, sports.  Pt in stable condition.  Latest VS- BP 141/85 mmHg  Pulse 86  Temp(Src) 99.1 F (37.3 C) (Oral)  Resp 18  Ht 5\' 5"  (1.651 m)  Wt 81.194 kg (179 lb)  BMI 29.79 kg/m2  SpO2 100%  LMP 03/08/2016 .

## 2016-03-28 NOTE — Progress Notes (Signed)
Patient was assessed and found stable prior to transport by [facility] to [place]. Report was given to the nurse there. The nurse was also informed that the patient would need 2200 meds that were not yet available here.

## 2016-03-28 NOTE — ED Notes (Signed)
Pt asked if she was able to tolerate an MRI exam.  Pt report she is able lay flat but has issues with anxiety in an MRI and had anti-anxiety medication previously. Dr. Tomi Bamberger made aware.

## 2016-03-29 ENCOUNTER — Inpatient Hospital Stay (HOSPITAL_COMMUNITY): Payer: 59

## 2016-03-29 DIAGNOSIS — K219 Gastro-esophageal reflux disease without esophagitis: Secondary | ICD-10-CM

## 2016-03-29 DIAGNOSIS — G43909 Migraine, unspecified, not intractable, without status migrainosus: Secondary | ICD-10-CM | POA: Diagnosis present

## 2016-03-29 MED ORDER — GADOBENATE DIMEGLUMINE 529 MG/ML IV SOLN
15.0000 mL | Freq: Once | INTRAVENOUS | Status: AC
Start: 2016-03-29 — End: 2016-03-29
  Administered 2016-03-29: 15 mL via INTRAVENOUS

## 2016-03-29 MED ORDER — LORAZEPAM 0.5 MG PO TABS
0.5000 mg | ORAL_TABLET | Freq: Once | ORAL | Status: AC
  Administered 2016-03-29: 0.5 mg via ORAL
  Filled 2016-03-29: qty 1

## 2016-03-29 NOTE — Progress Notes (Signed)
[facility] Progress Note  Patient: [name] [id number]   PCP: Garret Reddish, MD DOB: 16-Dec-1985   DOA: 03/27/2016   DOS: 03/29/2016   Date of Service: the patient was seen and examined on 03/29/2016  Subjective: Patient denies having any acute worsening overnight. Mentions her right hand numbness is actually improving. No chest pain no abdominal pain no nausea no vomiting. Nutrition: Tolerating oral diet  Brief hospital course: Patient was admitted on 03/27/2016, with complaint of right hand weakness and numbness, was found to have suspected MS exacerbation and she was brought to [facility] for further workup and treatment. Currently further plan is continue steroids and follow recommendation from neurology.  Assessment and Plan: 1. Multiple sclerosis exacerbation ([place]) Patient presents with right hand weakness. Did not have any other acute events in the past. MRI is positive for MS. We will follow recommendation of neurology. A currently on Solu-Medrol.  2. GERD. Continue PPI.  3. Chronic headache. Next on patient mentions she has chronic headache as well as leg spasm and for which she has been following up with Dr. Trenton Gammon from neurosurgery and it during the workup she was found to have some lesions on the MRI but they were not appearing acute and therefore she was recommended to continue monitoring for acute symptoms.  Pain management: When necessary Tylenol Activity: Currently independent Bowel regimen: last BM prior to arrival Diet: Regular diet DVT Prophylaxis: subcutaneous Heparin  Advance goals of care discussion: Full code  Family Communication: no family was present at bedside, at the time of interview.   Disposition:  Discharge to home Expected discharge date: 03/31/2016  Consultants: Neurology Procedures: none  Antibiotics: Anti-infectives    None        Intake/Output Summary (Last 24 hours) at 03/29/16 1942 Last data filed at  03/29/16 0900  Gross per 24 hour  Intake    240 ml  Output      0 ml  Net    240 ml   Filed Weights   03/28/16 0001 03/28/16 2206  Weight: 81.194 kg (179 lb) 85.458 kg (188 lb 6.4 oz)    Objective: Physical Exam: Filed Vitals:   03/28/16 2206 03/29/16 0146 03/29/16 0458 03/29/16 1400  BP: 145/84 136/75 122/59 129/67  Pulse: 96 82 79 87  Temp: 99 F (37.2 C) 98.2 F (36.8 C) 98 F (36.7 C) 99 F (37.2 C)  TempSrc: Oral Oral Oral Oral  Resp: 18 20 20 20   Height: 5\' 5"  (1.651 m)     Weight: 85.458 kg (188 lb 6.4 oz)     SpO2: 100% 98% 100% 93%    General: Alert, Awake and Oriented to Time, Place and Person. Appear in no distress Eyes: PERRL, Conjunctiva normal ENT: Oral Mucosa clear moist. Neck: no JVD, no Abnormal Mass Or lumps Cardiovascular: S1 and S2 Present, no Murmur, Peripheral Pulses Present Respiratory: Bilateral Air entry equal and Decreased, Clear to Auscultation, no Crackles, no wheezes Abdomen: Bowel Sound present, Soft and no tenderness Skin: no redness, no Rash  Extremities: no Pedal edema, no calf tenderness Neurologic: Grossly no focal neuro deficit. Bilaterally Equal motor strength  Data Reviewed: CBC:  Recent Labs Lab 03/28/16 0120  WBC 7.1  NEUTROABS 2.7  HGB 12.4  HCT 35.5*  MCV 84.1  PLT Q000111Q   Basic Metabolic Panel:  Recent Labs Lab 03/28/16 0120  NA 140  K 3.8  CL 110  CO2 21*  GLUCOSE 111*  BUN 11  CREATININE 0.84  CALCIUM 9.8    Liver Function Tests:  Recent Labs Lab 03/28/16 0120 03/28/16 1100  AST 19 19  ALT 18 17  ALKPHOS 55 51  BILITOT 0.3 0.5  PROT 8.0 8.4*  ALBUMIN 4.4 4.6   No results for input(s): LIPASE, AMYLASE in the last 168 hours. No results for input(s): AMMONIA in the last 168 hours. Coagulation Profile:  Recent Labs Lab 03/28/16 1100  INR 1.21   Cardiac Enzymes: No results for input(s): CKTOTAL, CKMB, CKMBINDEX, TROPONINI in the last 168 hours. BNP (last 3 results) No results for  input(s): PROBNP in the last 8760 hours.  CBG:  Recent Labs Lab 03/28/16 1109 03/28/16 1659  GLUCAP 88 149*    Studies: Mr Brain W Contrast  03/29/2016  CLINICAL DATA:  Multiple sclerosis EXAM: MRI HEAD WITH CONTRAST TECHNIQUE: Multiplanar, multiecho pulse sequences of the brain and surrounding structures were obtained with intravenous contrast. COMPARISON:  Unenhanced MRI 03/28/2016 CONTRAST:  46mL MULTIHANCE GADOBENATE DIMEGLUMINE 529 MG/ML IV SOLN FINDINGS: Right temporoparietal white matter lesion shows complete ring of enhancement. This area showed hyperintensity on T2 previously. 10 x 15 mm right frontal lesion shows complete ring of enhancement. This lesion show hyperintensity on T2 previously. No other lesion show abnormal enhancement. Multiple additional white matter lesions are present as described previously. Normal enhancement in the posterior fossa. Ventricle size remains normal. No fluid collection or shift of the midline structures. IMPRESSION: Enhancing lesions in the right temporoparietal lobe and right frontal lobe. Based on the history and prior study these are most likely areas of active demyelinization from multiple sclerosis. Other possible etiologies for enhancing lesion include metastatic disease and infection however given the precontrast study, multiple sclerosis appears highly likely. Electronically Signed   By: Franchot Gallo M.D.   On: 03/29/2016 13:59   Mr Cervical Spine W Wo Contrast  03/29/2016  CLINICAL DATA:  Multiple sclerosis EXAM: MRI CERVICAL SPINE WITHOUT AND WITH CONTRAST TECHNIQUE: Multiplanar and multiecho pulse sequences of the cervical spine, to include the craniocervical junction and cervicothoracic junction, were obtained according to standard protocol without and with intravenous contrast. CONTRAST:  74mL MULTIHANCE GADOBENATE DIMEGLUMINE 529 MG/ML IV SOLN COMPARISON:  None. FINDINGS: Normal cervical alignment. Negative for fracture. Bone marrow signal  normal. No significant degenerative change in the cervical spine. No disc protrusion or spinal stenosis. 5 mm hyperintensity in the spinal cord in the midline posteriorly at C3-4 shows mild enhancement Spinal cord lesion posterior laterally on the right at C6 shows mild enhancement. Remainder the cord signal is normal. IMPRESSION: Enhancing cord lesions at C3-4 posteriorly in the midline and at C6 posterior laterally on the right most consistent with multiple sclerosis and active demyelinization. No prior cervical MRI for comparison. Electronically Signed   By: Franchot Gallo M.D.   On: 03/29/2016 14:04   Mr Thoracic Spine W Wo Contrast  03/29/2016  CLINICAL DATA:  Multiple sclerosis EXAM: MRI THORACIC SPINE WITHOUT AND WITH CONTRAST TECHNIQUE: Multiplanar and multiecho pulse sequences of the thoracic spine were obtained without and with intravenous contrast. CONTRAST:  67mL MULTIHANCE GADOBENATE DIMEGLUMINE 529 MG/ML IV SOLN COMPARISON:  None. FINDINGS: Normal thoracic alignment. Negative for fracture or mass. Disc spaces are normal. No degenerative change. No significant spinal stenosis. No cord compression Spinal cord signal is normal. No evidence of demyelinating disease in the thoracic cord. Normal enhancement postcontrast infusion. No enhancing lesions identified. IMPRESSION: Normal Electronically Signed   By: Franchot Gallo M.D.   On: 03/29/2016 14:11  Scheduled Meds: . methylPREDNISolone (SOLU-MEDROL) injection  250 mg Intravenous Q6H  . pantoprazole  40 mg Oral Daily  . sodium chloride flush  3 mL Intravenous Q12H   Continuous Infusions: . sodium chloride 75 mL/hr at 03/28/16 1239   PRN Meds: acetaminophen **OR** acetaminophen, ondansetron **OR** ondansetron (ZOFRAN) IV  Time spent: 30 minutes  Author: Berle Mull, MD [facility] Pager: [phone] 03/29/2016 7:42 PM  If 7PM-7AM, please contact night-coverage at [email or web address], password [facility]

## 2016-03-29 NOTE — Care Management Note (Signed)
Case Management Note  Patient Details  Name: Anujin Coston MRN: [id number] Date of Birth: 1985/12/17  Subjective/Objective:                    Action/Plan: Patient was admitted with Right arm numbness and weakness.  Lives at home with family. Will follow for discharge needs pending patient's progress and physician orders.  Expected Discharge Date:                  Expected Discharge Plan:     In-House Referral:     Discharge planning Services     Post Acute Care Choice:    Choice offered to:     DME Arranged:    DME Agency:     HH Arranged:    HH Agency:     Status of Service:  In process, will continue to follow  Medicare Important Message Given:    Date Medicare IM Given:    Medicare IM give by:    Date Additional Medicare IM Given:    Additional Medicare Important Message give by:     If discussed at [place] of Stay Meetings, dates discussed:    Additional CommentsRolm Baptise, RN 03/29/2016, 10:09 AM [phone]

## 2016-03-30 ENCOUNTER — Inpatient Hospital Stay (HOSPITAL_COMMUNITY): Payer: 59

## 2016-03-30 DIAGNOSIS — G35 Multiple sclerosis: Principal | ICD-10-CM

## 2016-03-30 LAB — CSF CELL COUNT WITH DIFFERENTIAL
LYMPHS CSF: 40 % (ref 40–80)
Lymphs, CSF: 64 % (ref 40–80)
RBC COUNT CSF: 17250 /mm3 — AB
RBC Count, CSF: 5200 /mm3 — ABNORMAL HIGH
Segmented Neutrophils-CSF: 36 % — ABNORMAL HIGH (ref 0–6)
Segmented Neutrophils-CSF: 60 % — ABNORMAL HIGH (ref 0–6)
TUBE #: 1
TUBE #: 4
WBC, CSF: 10 /mm3 — ABNORMAL HIGH (ref 0–5)
WBC, CSF: 8 /mm3 — ABNORMAL HIGH (ref 0–5)

## 2016-03-30 LAB — CBC WITH DIFFERENTIAL/PLATELET
BASOS ABS: 0 10*3/uL (ref 0.0–0.1)
Basophils Relative: 0 %
Eosinophils Absolute: 0 10*3/uL (ref 0.0–0.7)
Eosinophils Relative: 0 %
HEMATOCRIT: 35.1 % — AB (ref 36.0–46.0)
Hemoglobin: 11.9 g/dL — ABNORMAL LOW (ref 12.0–15.0)
LYMPHS ABS: 1.4 10*3/uL (ref 0.7–4.0)
LYMPHS PCT: 8 %
MCH: 28.6 pg (ref 26.0–34.0)
MCHC: 33.9 g/dL (ref 30.0–36.0)
MCV: 84.4 fL (ref 78.0–100.0)
MONO ABS: 0.5 10*3/uL (ref 0.1–1.0)
Monocytes Relative: 3 %
NEUTROS ABS: 14.9 10*3/uL — AB (ref 1.7–7.7)
Neutrophils Relative %: 89 %
Platelets: 279 10*3/uL (ref 150–400)
RBC: 4.16 MIL/uL (ref 3.87–5.11)
RDW: 14.1 % (ref 11.5–15.5)
WBC: 16.8 10*3/uL — ABNORMAL HIGH (ref 4.0–10.5)

## 2016-03-30 LAB — BASIC METABOLIC PANEL
ANION GAP: 12 (ref 5–15)
BUN: 12 mg/dL (ref 6–20)
CO2: 22 mmol/L (ref 22–32)
Calcium: 9.4 mg/dL (ref 8.9–10.3)
Chloride: 105 mmol/L (ref 101–111)
Creatinine, Ser: 0.79 mg/dL (ref 0.44–1.00)
GFR calc Af Amer: >60 mL/min (ref >60)
GFR calc non Af Amer: >60 mL/min (ref >60)
GLUCOSE: 156 mg/dL — AB (ref 65–99)
POTASSIUM: 3.6 mmol/L (ref 3.5–5.1)
Sodium: 139 mmol/L (ref 135–145)

## 2016-03-30 LAB — GLUCOSE, CSF: Glucose, CSF: 100 mg/dL — ABNORMAL HIGH (ref 40–70)

## 2016-03-30 LAB — PROTEIN, CSF: Total  Protein, CSF: 42 mg/dL (ref 15–45)

## 2016-03-30 MED ORDER — LIDOCAINE HCL (PF) 1 % IJ SOLN
INTRAMUSCULAR | Status: AC
  Administered 2016-03-30: 5 mL via INTRAMUSCULAR
  Filled 2016-03-30: qty 5

## 2016-03-30 MED ORDER — LORAZEPAM 2 MG/ML IJ SOLN
INTRAMUSCULAR | Status: AC
  Filled 2016-03-30: qty 1

## 2016-03-30 MED ORDER — LORAZEPAM 2 MG/ML IJ SOLN
0.2500 mg | Freq: Once | INTRAMUSCULAR | Status: AC
  Administered 2016-03-30: 0.25 mg via INTRAVENOUS

## 2016-03-30 MED ORDER — LORAZEPAM 2 MG/ML IJ SOLN
0.5000 mg | Freq: Once | INTRAMUSCULAR | Status: AC
  Administered 2016-03-30: 0.5 mg via INTRAVENOUS
  Filled 2016-03-30: qty 1

## 2016-03-30 NOTE — Procedures (Signed)
Fluoro guide LP performed at L3-L4.  Opening pressure 20 cm H20.  Bloody tap, which cleared slowly, but yielded persistent light rose-colored CSF in tube #4.  15.5 ML CSF obtained.  No complications.  See full dictation in PACS for full details.

## 2016-03-30 NOTE — Progress Notes (Signed)
[facility] Progress Note  Patient: [name] [id number]   PCP: Garret Reddish, MD DOB: 1985-12-24   DOA: 03/27/2016   DOS: 03/30/2016   Date of Service: the patient was seen and examined on 03/30/2016  Subjective: Right hand numbness completely resolved. No nausea no vomiting. No abdominal pain. No other acute complaint. Nutrition: Tolerating oral diet  Brief hospital course: Patient was admitted on 03/27/2016, with complaint of right hand weakness and numbness, was found to have suspected MS exacerbation and she was brought to [facility] for further workup and treatment. Currently further plan is continue steroids and follow recommendation from neurology.  Assessment and Plan: 1. Multiple sclerosis exacerbation ([place]) Patient presents with right hand weakness. Did not have any other acute events in the past. MRI is positive for MS. Currently on high-dose Solu-Medrol. We will await recommendation from neurology. Patient will undergo lumbar puncture with IR guidance.  2. GERD. Continue PPI.  3. Chronic headache.  patient mentions she has chronic headache as well as leg spasm and for which she has been following up with Dr. Trenton Gammon from neurosurgery and during the workup she was found to have some lesions on the MRI but they were not appearing acute and therefore she was recommended to continue monitoring for acute symptoms.  Pain management: When necessary Tylenol Activity: Currently independent Bowel regimen: last BM 03/29/2016 Diet: Regular diet DVT Prophylaxis: subcutaneous Heparin  Advance goals of care discussion: Full code  Family Communication: family was present at bedside, at the time of interview. The pt provided permission to discuss medical plan with the family. Opportunity was given to ask question and all questions were answered satisfactorily.   Disposition:  Discharge to home Expected discharge date: 04/01/2016  Consultants:  Neurology Procedures: IR guided lumbar puncture  Antibiotics: Anti-infectives    None       No intake or output data in the 24 hours ending 03/30/16 2100 Filed Weights   03/28/16 0001 03/28/16 2206  Weight: 81.194 kg (179 lb) 85.458 kg (188 lb 6.4 oz)    Objective: Physical Exam: Filed Vitals:   03/30/16 0614 03/30/16 1005 03/30/16 1425 03/30/16 1738  BP: 136/81 139/86 148/93 140/85  Pulse: 83 84 69 86  Temp: 98.7 F (37.1 C) 99.6 F (37.6 C) 99.3 F (37.4 C) 99.2 F (37.3 C)  TempSrc: Oral Oral Oral Oral  Resp: 20 20 20    Height:      Weight:      SpO2: 100% 99% 99% 97%    General: Alert, Awake and Oriented to Time, Place and Person. Appear in no distress Eyes: PERRL, Conjunctiva normal ENT: Oral Mucosa clear moist. Neck: no JVD, no Abnormal Mass Or lumps Cardiovascular: S1 and S2 Present, no Murmur, Peripheral Pulses Present Respiratory: Bilateral Air entry equal and Decreased, Clear to Auscultation, no Crackles, no wheezes Abdomen: Bowel Sound present, Soft and no tenderness Skin: no redness, no Rash  Extremities: no Pedal edema, no calf tenderness Neurologic: Grossly no focal neuro deficit. Bilaterally Equal motor strength, No numbness of the upper extremity  Data Reviewed: CBC:  Recent Labs Lab 03/28/16 0120 03/30/16 0628  WBC 7.1 16.8*  NEUTROABS 2.7 14.9*  HGB 12.4 11.9*  HCT 35.5* 35.1*  MCV 84.1 84.4  PLT 286 [id number]   Basic Metabolic Panel:  Recent Labs Lab 03/28/16 0120 03/30/16 0628  NA 140 139  K 3.8 3.6  CL 110 105  CO2 21* 22  GLUCOSE 111* 156*  BUN 11 12  CREATININE  0.84 0.79  CALCIUM 9.8 9.4    Liver Function Tests:  Recent Labs Lab 03/28/16 0120 03/28/16 1100  AST 19 19  ALT 18 17  ALKPHOS 55 51  BILITOT 0.3 0.5  PROT 8.0 8.4*  ALBUMIN 4.4 4.6   No results for input(s): LIPASE, AMYLASE in the last 168 hours. No results for input(s): AMMONIA in the last 168 hours. Coagulation Profile:  Recent Labs Lab  03/28/16 1100  INR 1.21   Cardiac Enzymes: No results for input(s): CKTOTAL, CKMB, CKMBINDEX, TROPONINI in the last 168 hours. BNP (last 3 results) No results for input(s): PROBNP in the last 8760 hours.  CBG:  Recent Labs Lab 03/28/16 1109 03/28/16 1659  GLUCAP 88 149*    Studies: Dg Fluoro Guide Lumbar Puncture  03/30/2016  CLINICAL DATA:  30 year female with headache and unexplained upper extremity numbness. Under evaluation for potential multiple sclerosis. EXAM: DIAGNOSTIC LUMBAR PUNCTURE UNDER FLUOROSCOPIC GUIDANCE FLUOROSCOPY TIME:  42 seconds PROCEDURE: Informed consent was obtained from the patient prior to the procedure, including potential complications of headache, allergy, and pain. With the patient prone, the lower back was prepped with Betadine. 1% Lidocaine was used for local anesthesia. Lumbar puncture was performed at the L3-L4 level using a 20 gauge needle with return of blood-tinged CSF with an opening pressure of 20 cm water. Although CSF did become more clear throughout the examination, it remained slightly rose colored even in tube #4. 15.5 ml of CSF were obtained for laboratory studies. The patient tolerated the procedure well and there were no apparent complications. IMPRESSION: 1. Successful uncomplicated fluoroscopic guided lumbar puncture at L3-L4, as above. Electronically Signed   By: Vinnie Langton M.D.   On: 03/30/2016 15:20     Scheduled Meds: . methylPREDNISolone (SOLU-MEDROL) injection  250 mg Intravenous Q6H  . pantoprazole  40 mg Oral Daily  . sodium chloride flush  3 mL Intravenous Q12H   Continuous Infusions: . sodium chloride 75 mL/hr at 03/28/16 1239   PRN Meds: acetaminophen **OR** acetaminophen, ondansetron **OR** ondansetron (ZOFRAN) IV  Time spent: 30 minutes  Author: Berle Mull, MD [facility] Pager: [phone] 03/30/2016 9:00 PM  If 7PM-7AM, please contact night-coverage at [email or web address], password [facility]

## 2016-03-31 DIAGNOSIS — R51 Headache: Secondary | ICD-10-CM

## 2016-03-31 DIAGNOSIS — I10 Essential (primary) hypertension: Secondary | ICD-10-CM

## 2016-03-31 LAB — CBC WITH DIFFERENTIAL/PLATELET
Basophils Absolute: 0 10*3/uL (ref 0.0–0.1)
Basophils Relative: 0 %
EOS ABS: 0 10*3/uL (ref 0.0–0.7)
EOS PCT: 0 %
HCT: 34.4 % — ABNORMAL LOW (ref 36.0–46.0)
Hemoglobin: 12 g/dL (ref 12.0–15.0)
LYMPHS ABS: 1.5 10*3/uL (ref 0.7–4.0)
Lymphocytes Relative: 12 %
MCH: 29.3 pg (ref 26.0–34.0)
MCHC: 34.9 g/dL (ref 30.0–36.0)
MCV: 83.9 fL (ref 78.0–100.0)
MONOS PCT: 2 %
Monocytes Absolute: 0.3 10*3/uL (ref 0.1–1.0)
Neutro Abs: 10.4 10*3/uL — ABNORMAL HIGH (ref 1.7–7.7)
Neutrophils Relative %: 86 %
PLATELETS: 276 10*3/uL (ref 150–400)
RBC: 4.1 MIL/uL (ref 3.87–5.11)
RDW: 13.7 % (ref 11.5–15.5)
WBC: 12.1 10*3/uL — ABNORMAL HIGH (ref 4.0–10.5)

## 2016-03-31 LAB — COMPREHENSIVE METABOLIC PANEL
ALK PHOS: 42 U/L (ref 38–126)
ALT: 40 U/L (ref 14–54)
AST: 27 U/L (ref 15–41)
Albumin: 3.2 g/dL — ABNORMAL LOW (ref 3.5–5.0)
Anion gap: 13 (ref 5–15)
BUN: 12 mg/dL (ref 6–20)
CALCIUM: 9 mg/dL (ref 8.9–10.3)
CHLORIDE: 104 mmol/L (ref 101–111)
CO2: 23 mmol/L (ref 22–32)
CREATININE: 0.84 mg/dL (ref 0.44–1.00)
GFR calc Af Amer: >60 mL/min (ref >60)
Glucose, Bld: 158 mg/dL — ABNORMAL HIGH (ref 65–99)
Potassium: 3.6 mmol/L (ref 3.5–5.1)
SODIUM: 140 mmol/L (ref 135–145)
Total Bilirubin: 0.3 mg/dL (ref 0.3–1.2)
Total Protein: 6.5 g/dL (ref 6.5–8.1)

## 2016-03-31 LAB — HERPES SIMPLEX VIRUS(HSV) DNA BY PCR
HSV 1 DNA: NEGATIVE
HSV 2 DNA: NEGATIVE

## 2016-03-31 LAB — GLUCOSE, CAPILLARY
GLUCOSE-CAPILLARY: 200 mg/dL — AB (ref 65–99)
Glucose-Capillary: 160 mg/dL — ABNORMAL HIGH (ref 65–99)
Glucose-Capillary: 265 mg/dL — ABNORMAL HIGH (ref 65–99)

## 2016-03-31 LAB — CSF IGG: IgG, CSF: 5.8 mg/dL (ref 0.0–8.6)

## 2016-03-31 LAB — VDRL, CSF: VDRL Quant, CSF: NONREACTIVE

## 2016-03-31 LAB — MYELIN BASIC PROTEIN, CSF: Myelin Basic Protein: 1.4 ng/mL — ABNORMAL HIGH (ref 0.0–1.2)

## 2016-03-31 NOTE — Consult Note (Signed)
Requesting Physician: Dr.  Posey Pronto    Reason for consultation:  To manage MS  HPI:                                                                                                                                         [name] is an 30 y.o. female patient who  was brought into the emergency room with right upper extremity numbness and paresthesias. She had a brain MRI done which showed several demyelinating lesions in a typical topography suspicious for multiple sclerosis.   I received call from the ER physician yesterday after the patient's MRIs done. After I reviewed the MRI, as the topography of these lesions is highly suspicious for MS acute onset of her symptoms, she was started on IV Solu-Medrol 250 g every 6 hours.  Patient reports that her numbness symptoms are completely resolved this morning after starting IV steroids. She started in steroids well without side effects. No new neurological symptoms.  Patient denies any family history of MS.   Couple of years ago she appeared he had some involuntary movements involving the right upper extremity, and had an MRI brain at that time at an outside facility. She was told that she had a few white spots but they were not significant enough to consider MS has a diagnosis at that time. I do not have any corroborative records or images for review.   Past Medical History: Past Medical History  Diagnosis Date  . HEADACHE, CLUSTER, CHRONIC 02/06/2010  . Allergic rhinitis due to pollen 02/06/2010  . Esophageal reflux 11/29/2008  . Hypertension     Past Surgical History  Procedure Laterality Date  . No past surgeries    . Cesarean section  06/29/2012    Procedure: CESAREAN SECTION;  Surgeon: Allena Katz, MD;  Location: [facility];  Service: Obstetrics;  Laterality: N/A;    Family History: No family history on file.  Social History:   reports that she has never smoked. She does not have any smokeless tobacco history on file. She  reports that she drinks alcohol. She reports that she does not use illicit drugs.  Allergies:  No Known Allergies   Medications:  Current facility-administered medications:  .  0.9 %  sodium chloride infusion, , Intravenous, Continuous, Kelvin Cellar, MD, Last Rate: 75 mL/hr at 03/28/16 1239 .  acetaminophen (TYLENOL) tablet 650 mg, 650 mg, Oral, Q6H PRN, 650 mg at 03/30/16 2135 **OR** acetaminophen (TYLENOL) suppository 650 mg, 650 mg, Rectal, Q6H PRN, Kelvin Cellar, MD .  methylPREDNISolone sodium succinate (SOLU-MEDROL) 250 mg in sodium chloride 0.9 % 50 mL IVPB, 250 mg, Intravenous, Q6H, Kelvin Cellar, MD, 250 mg at 03/31/16 0355 .  ondansetron (ZOFRAN) tablet 4 mg, 4 mg, Oral, Q6H PRN **OR** ondansetron (ZOFRAN) injection 4 mg, 4 mg, Intravenous, Q6H PRN, Kelvin Cellar, MD .  pantoprazole (PROTONIX) EC tablet 40 mg, 40 mg, Oral, Daily, Myrel Rappleye Fuller Mandril, MD, 40 mg at 03/30/16 0901 .  sodium chloride flush (NS) 0.9 % injection 3 mL, 3 mL, Intravenous, Q12H, Kelvin Cellar, MD, 3 mL at 03/30/16 1000   ROS:                                                                                                                                       History obtained from the patient  General ROS: negative for - chills, fatigue, fever, night sweats, weight gain or weight loss Psychological ROS: negative for - behavioral disorder, hallucinations, memory difficulties, mood swings or suicidal ideation Ophthalmic ROS: negative for - blurry vision, double vision, eye pain or loss of vision ENT ROS: negative for - epistaxis, nasal discharge, oral lesions, sore throat, tinnitus or vertigo Allergy and Immunology ROS: negative for - hives or itchy/watery eyes Hematological and Lymphatic ROS: negative for - bleeding problems, bruising or swollen lymph nodes Endocrine ROS:  negative for - galactorrhea, hair pattern changes, polydipsia/polyuria or temperature intolerance Respiratory ROS: negative for - cough, hemoptysis, shortness of breath or wheezing Cardiovascular ROS: negative for - chest pain, dyspnea on exertion, edema or irregular heartbeat Gastrointestinal ROS: negative for - abdominal pain, diarrhea, hematemesis, nausea/vomiting or stool incontinence Genito-Urinary ROS: negative for - dysuria, hematuria, incontinence or urinary frequency/urgency Musculoskeletal ROS: negative for - joint swelling or muscular weakness Neurological ROS: as noted in HPI Dermatological ROS: negative for rash and skin lesion changes  Neurologic Examination:                                                                                                    Today's Vitals   03/30/16 1738 03/30/16 2152 03/31/16 0200 03/31/16 0551  BP: 140/85 135/75 160/89 137/84  Pulse: 86 65 65 66  Temp:  99.2 F (37.3 C) 98.9 F (37.2 C) 98.6 F (37 C) 98.7 F (37.1 C)  TempSrc: Oral Oral Oral Oral  Resp:  18 18 18   Height:      Weight:      SpO2: 97% 98% 97% 99%  PainSc:        Evaluation of higher integrative functions including: Level of alertness: Alert,  Oriented to time, place and person Recent and remote memory - intact   Attention span and concentration  - intact   Speech: fluent, no evidence of dysarthria or aphasia noted.  Test the following cranial nerves: 2-12 grossly intact Motor examination: Normal tone, bulk, full 5/5 motor strength in all 4 extremities Examination of sensation : Normal and symmetric sensation to pinprick in all 4 extremities and on face Examination of deep tendon reflexes: 2+, normal and symmetric in all extremities, normal plantars bilaterally Test coordination: Normal finger nose testing, with no evidence of limb appendicular ataxia or abnormal involuntary movements or tremors noted.  Gait: normal       Lab Results: Basic Metabolic  Panel:  Recent Labs Lab 03/28/16 0120 03/30/16 0628 03/31/16 0626  NA 140 139 140  K 3.8 3.6 3.6  CL 110 105 104  CO2 21* 22 23  GLUCOSE 111* 156* 158*  BUN 11 12 12   CREATININE 0.84 0.79 0.84  CALCIUM 9.8 9.4 9.0    Liver Function Tests:  Recent Labs Lab 03/28/16 0120 03/28/16 1100 03/31/16 0626  AST 19 19 27   ALT 18 17 40  ALKPHOS 55 51 42  BILITOT 0.3 0.5 0.3  PROT 8.0 8.4* 6.5  ALBUMIN 4.4 4.6 3.2*   No results for input(s): LIPASE, AMYLASE in the last 168 hours. No results for input(s): AMMONIA in the last 168 hours.  CBC:  Recent Labs Lab 03/28/16 0120 03/30/16 0628 03/31/16 0626  WBC 7.1 16.8* 12.1*  NEUTROABS 2.7 14.9* 10.4*  HGB 12.4 11.9* 12.0  HCT 35.5* 35.1* 34.4*  MCV 84.1 84.4 83.9  PLT 286 279 276    Cardiac Enzymes: No results for input(s): CKTOTAL, CKMB, CKMBINDEX, TROPONINI in the last 168 hours.  Lipid Panel: No results for input(s): CHOL, TRIG, HDL, CHOLHDL, VLDL, LDLCALC in the last 168 hours.  CBG:  Recent Labs Lab 03/28/16 1109 03/28/16 1659 03/31/16 0634  GLUCAP 88 149* 160*    Microbiology: Results for orders placed or performed during the hospital encounter of 03/27/16  CSF culture     Status: None (Preliminary result)   Collection Time: 03/30/16  2:47 PM  Result Value Ref Range Status   Specimen Description CSF  Final   Special Requests NONE  Final   Gram Stain   Final    WBC PRESENT,BOTH PMN AND MONONUCLEAR NO ORGANISMS SEEN CYTOSPIN SMEAR    Culture PENDING  Incomplete   Report Status PENDING  Incomplete     Imaging: Dg Cervical Spine Complete  03/28/2016  CLINICAL DATA:  Right upper extremity paresthesias for 2 weeks, nontraumatic. EXAM: CERVICAL SPINE - COMPLETE 4+ VIEW COMPARISON:  None. FINDINGS: The cervical vertebrae are normal in height. There is slight reversal of cervical lordosis. There is no fracture. There is no acute soft tissue abnormality. Neural foramina are widely patent. Good  preservation of intervertebral disc spaces. No bone lesion or bony destruction. IMPRESSION: Negative cervical spine radiographs. Electronically Signed   By: Andreas Newport M.D.   On: 03/28/2016 02:23   Mr Brain Wo Contrast (neuro Protocol)  03/28/2016  CLINICAL DATA:  Numbness of the right arm and hand beginning 2 weeks ago. EXAM: MRI HEAD WITHOUT CONTRAST TECHNIQUE: Multiplanar, multiecho pulse sequences of the brain and surrounding structures were obtained without intravenous contrast. COMPARISON:  11/03/2011.  10/11/2010. FINDINGS: Diffusion imaging does not show any acute or subacute infarction. Since the previous study, there has been marked progression of white matter lesions with involvement of the left middle cerebellar peduncle and extensively throughout the cerebral hemispheric deep and subcortical white matter. The pattern and distribution are highly suggestive of active multiple sclerosis. Other causes of active demyelination are possible less likely particularly at this age. No neoplastic mass lesion, hemorrhage, hydrocephalus or extra-axial collection. No pituitary mass. No inflammatory sinus disease. Incidental retention cyst left maxillary sinus. No skull or skullbase lesion. Major vessels are patent at the base of the brain. IMPRESSION: Marked progression of white matter lesions throughout the brain since the study of 2012. The pattern and distribution is highly likely to indicate active multiple sclerosis. Electronically Signed   By: Nelson Chimes M.D.   On: 03/28/2016 09:56   Mr Brain W Contrast  03/29/2016  CLINICAL DATA:  Multiple sclerosis EXAM: MRI HEAD WITH CONTRAST TECHNIQUE: Multiplanar, multiecho pulse sequences of the brain and surrounding structures were obtained with intravenous contrast. COMPARISON:  Unenhanced MRI 03/28/2016 CONTRAST:  14mL MULTIHANCE GADOBENATE DIMEGLUMINE 529 MG/ML IV SOLN FINDINGS: Right temporoparietal white matter lesion shows complete ring of  enhancement. This area showed hyperintensity on T2 previously. 10 x 15 mm right frontal lesion shows complete ring of enhancement. This lesion show hyperintensity on T2 previously. No other lesion show abnormal enhancement. Multiple additional white matter lesions are present as described previously. Normal enhancement in the posterior fossa. Ventricle size remains normal. No fluid collection or shift of the midline structures. IMPRESSION: Enhancing lesions in the right temporoparietal lobe and right frontal lobe. Based on the history and prior study these are most likely areas of active demyelinization from multiple sclerosis. Other possible etiologies for enhancing lesion include metastatic disease and infection however given the precontrast study, multiple sclerosis appears highly likely. Electronically Signed   By: Franchot Gallo M.D.   On: 03/29/2016 13:59   Mr Cervical Spine W Wo Contrast  03/29/2016  CLINICAL DATA:  Multiple sclerosis EXAM: MRI CERVICAL SPINE WITHOUT AND WITH CONTRAST TECHNIQUE: Multiplanar and multiecho pulse sequences of the cervical spine, to include the craniocervical junction and cervicothoracic junction, were obtained according to standard protocol without and with intravenous contrast. CONTRAST:  59mL MULTIHANCE GADOBENATE DIMEGLUMINE 529 MG/ML IV SOLN COMPARISON:  None. FINDINGS: Normal cervical alignment. Negative for fracture. Bone marrow signal normal. No significant degenerative change in the cervical spine. No disc protrusion or spinal stenosis. 5 mm hyperintensity in the spinal cord in the midline posteriorly at C3-4 shows mild enhancement Spinal cord lesion posterior laterally on the right at C6 shows mild enhancement. Remainder the cord signal is normal. IMPRESSION: Enhancing cord lesions at C3-4 posteriorly in the midline and at C6 posterior laterally on the right most consistent with multiple sclerosis and active demyelinization. No prior cervical MRI for comparison.  Electronically Signed   By: Franchot Gallo M.D.   On: 03/29/2016 14:04   Mr Thoracic Spine W Wo Contrast  03/29/2016  CLINICAL DATA:  Multiple sclerosis EXAM: MRI THORACIC SPINE WITHOUT AND WITH CONTRAST TECHNIQUE: Multiplanar and multiecho pulse sequences of the thoracic spine were obtained without and with intravenous contrast. CONTRAST:  10mL MULTIHANCE GADOBENATE DIMEGLUMINE 529 MG/ML IV SOLN COMPARISON:  None. FINDINGS: Normal thoracic  alignment. Negative for fracture or mass. Disc spaces are normal. No degenerative change. No significant spinal stenosis. No cord compression Spinal cord signal is normal. No evidence of demyelinating disease in the thoracic cord. Normal enhancement postcontrast infusion. No enhancing lesions identified. IMPRESSION: Normal Electronically Signed   By: Franchot Gallo M.D.   On: 03/29/2016 14:11   Dg Fluoro Guide Lumbar Puncture  03/30/2016  CLINICAL DATA:  30 year female with headache and unexplained upper extremity numbness. Under evaluation for potential multiple sclerosis. EXAM: DIAGNOSTIC LUMBAR PUNCTURE UNDER FLUOROSCOPIC GUIDANCE FLUOROSCOPY TIME:  42 seconds PROCEDURE: Informed consent was obtained from the patient prior to the procedure, including potential complications of headache, allergy, and pain. With the patient prone, the lower back was prepped with Betadine. 1% Lidocaine was used for local anesthesia. Lumbar puncture was performed at the L3-L4 level using a 20 gauge needle with return of blood-tinged CSF with an opening pressure of 20 cm water. Although CSF did become more clear throughout the examination, it remained slightly rose colored even in tube #4. 15.5 ml of CSF were obtained for laboratory studies. The patient tolerated the procedure well and there were no apparent complications. IMPRESSION: 1. Successful uncomplicated fluoroscopic guided lumbar puncture at L3-L4, as above. Electronically Signed   By: Vinnie Langton M.D.   On: 03/30/2016  15:20     Assessment and plan:   Channelle Langill is an 30 y.o. female patient with new diagnosis of multiple sclerosis based on abnormal brain MRI showing several demyelinating lesions in a typical topography consistent with the diagnosis. The initial brain MRI done yesterday was a noncontrast study. Recommend repeating the MRI brain with contrast, as well as obtaining an MRI of the cervical and thoracic spine with and without contrast to evaluate for spinal cord lesions.  She is tolerating IV steroids, with complete resolution of the numbness symptoms in her right upper extremity. Plan a total of 5 days of IV Solu-Medrol 250 mg every 6 hours.  We will also perform a lumbar puncture for CSF analysis to rule out MS mimics and to confirm the diagnosis with CSF analysis.    We'll follow-up.

## 2016-03-31 NOTE — Progress Notes (Signed)
Interval History:                                                                                                                      [name] is an 30 y.o. female patient with  multiple sclerosis , with the diagnosis based on abnormal brain MRI Showing several enhancing as well as nonenhancing demyelinating lesions in the brain, and cervical spinal cord. Clinically, her right upper extremity sensory symptoms are completely resolved with IV steroids. Tolerating IV steroids without problems.   Past Medical History: Past Medical History  Diagnosis Date  . HEADACHE, CLUSTER, CHRONIC 02/06/2010  . Allergic rhinitis due to pollen 02/06/2010  . Esophageal reflux 11/29/2008  . Hypertension     Past Surgical History  Procedure Laterality Date  . No past surgeries    . Cesarean section  06/29/2012    Procedure: CESAREAN SECTION;  Surgeon: Allena Katz, MD;  Location: [facility];  Service: Obstetrics;  Laterality: N/A;    Family History: No family history on file.  Social History:   reports that she has never smoked. She does not have any smokeless tobacco history on file. She reports that she drinks alcohol. She reports that she does not use illicit drugs.  Allergies:  No Known Allergies   Medications:                                                                                                                         Current facility-administered medications:  .  0.9 %  sodium chloride infusion, , Intravenous, Continuous, Kelvin Cellar, MD, Last Rate: 75 mL/hr at 03/28/16 1239 .  acetaminophen (TYLENOL) tablet 650 mg, 650 mg, Oral, Q6H PRN, 650 mg at 03/31/16 1612 **OR** acetaminophen (TYLENOL) suppository 650 mg, 650 mg, Rectal, Q6H PRN, Kelvin Cellar, MD .  methylPREDNISolone sodium succinate (SOLU-MEDROL) 250 mg in sodium chloride 0.9 % 50 mL IVPB, 250 mg, Intravenous, Q6H, Kelvin Cellar, MD, 250 mg at 03/31/16 2145 .  ondansetron (ZOFRAN) tablet 4 mg, 4 mg, Oral,  Q6H PRN **OR** ondansetron (ZOFRAN) injection 4 mg, 4 mg, Intravenous, Q6H PRN, Kelvin Cellar, MD .  pantoprazole (PROTONIX) EC tablet 40 mg, 40 mg, Oral, Daily, Osher Oettinger Fuller Mandril, MD, 40 mg at 03/31/16 [phone] .  sodium chloride flush (NS) 0.9 % injection 3 mL, 3 mL, Intravenous, Q12H, Kelvin Cellar, MD, 3 mL at 03/31/16 1006   Neurologic Examination:  Today's Vitals   03/31/16 1421 03/31/16 1612 03/31/16 1737 03/31/16 2234  BP: 147/92  150/87 155/77  Pulse: 66  59 50  Temp: 98 F (36.7 C)  98.9 F (37.2 C) 98.5 F (36.9 C)  TempSrc: Oral  Oral Oral  Resp: 18  18 18   Height:      Weight:      SpO2: 95%  94% 98%  PainSc:  5       Evaluation of higher integrative functions including: Level of alertness: Alert,  Oriented to time, place and person Recent and remote memory - intact  Attention span and concentration - intact  Speech: fluent, no evidence of dysarthria or aphasia noted.  Test the following cranial nerves: 2-12 grossly intact Motor examination: Normal tone, bulk, full 5/5 motor strength in all 4 extremities Examination of sensation : Normal and symmetric sensation to pinprick in all 4 extremities and on face Examination of deep tendon reflexes: 2+, normal and symmetric in all extremities, normal plantars bilaterally Test coordination: Normal finger nose testing, with no evidence of limb appendicular ataxia or abnormal involuntary movements or tremors noted.  Gait: normal   Lab Results: Basic Metabolic Panel:  Recent Labs Lab 03/28/16 0120 03/30/16 0628 03/31/16 0626  NA 140 139 140  K 3.8 3.6 3.6  CL 110 105 104  CO2 21* 22 23  GLUCOSE 111* 156* 158*  BUN 11 12 12   CREATININE 0.84 0.79 0.84  CALCIUM 9.8 9.4 9.0    Liver Function Tests:  Recent Labs Lab 03/28/16 0120 03/28/16 1100 03/31/16 0626  AST 19 19 27   ALT 18 17 40   ALKPHOS 55 51 42  BILITOT 0.3 0.5 0.3  PROT 8.0 8.4* 6.5  ALBUMIN 4.4 4.6 3.2*   No results for input(s): LIPASE, AMYLASE in the last 168 hours. No results for input(s): AMMONIA in the last 168 hours.  CBC:  Recent Labs Lab 03/28/16 0120 03/30/16 0628 03/31/16 0626  WBC 7.1 16.8* 12.1*  NEUTROABS 2.7 14.9* 10.4*  HGB 12.4 11.9* 12.0  HCT 35.5* 35.1* 34.4*  MCV 84.1 84.4 83.9  PLT 286 279 276    Cardiac Enzymes: No results for input(s): CKTOTAL, CKMB, CKMBINDEX, TROPONINI in the last 168 hours.  Lipid Panel: No results for input(s): CHOL, TRIG, HDL, CHOLHDL, VLDL, LDLCALC in the last 168 hours.  CBG:  Recent Labs Lab 03/28/16 1109 03/28/16 1659 03/31/16 0634 03/31/16 1102 03/31/16 1610  GLUCAP 88 149* 160* 265* 200*    Microbiology: Results for orders placed or performed during the hospital encounter of 03/27/16  CSF culture     Status: None (Preliminary result)   Collection Time: 03/30/16  2:47 PM  Result Value Ref Range Status   Specimen Description CSF  Final   Special Requests NONE  Final   Gram Stain   Final    WBC PRESENT,BOTH PMN AND MONONUCLEAR NO ORGANISMS SEEN CYTOSPIN SMEAR    Culture NO GROWTH < 24 HOURS  Final   Report Status PENDING  Incomplete    Imaging: Dg Cervical Spine Complete  03/28/2016  CLINICAL DATA:  Right upper extremity paresthesias for 2 weeks, nontraumatic. EXAM: CERVICAL SPINE - COMPLETE 4+ VIEW COMPARISON:  None. FINDINGS: The cervical vertebrae are normal in height. There is slight reversal of cervical lordosis. There is no fracture. There is no acute soft tissue abnormality. Neural foramina are widely patent. Good preservation of intervertebral disc spaces. No bone lesion or bony destruction. IMPRESSION: Negative cervical spine radiographs. Electronically Signed  By: Andreas Newport M.D.   On: 03/28/2016 02:23   Mr Brain Wo Contrast (neuro Protocol)  03/28/2016  CLINICAL DATA:  Numbness of the right arm and hand  beginning 2 weeks ago. EXAM: MRI HEAD WITHOUT CONTRAST TECHNIQUE: Multiplanar, multiecho pulse sequences of the brain and surrounding structures were obtained without intravenous contrast. COMPARISON:  11/03/2011.  10/11/2010. FINDINGS: Diffusion imaging does not show any acute or subacute infarction. Since the previous study, there has been marked progression of white matter lesions with involvement of the left middle cerebellar peduncle and extensively throughout the cerebral hemispheric deep and subcortical white matter. The pattern and distribution are highly suggestive of active multiple sclerosis. Other causes of active demyelination are possible less likely particularly at this age. No neoplastic mass lesion, hemorrhage, hydrocephalus or extra-axial collection. No pituitary mass. No inflammatory sinus disease. Incidental retention cyst left maxillary sinus. No skull or skullbase lesion. Major vessels are patent at the base of the brain. IMPRESSION: Marked progression of white matter lesions throughout the brain since the study of 2012. The pattern and distribution is highly likely to indicate active multiple sclerosis. Electronically Signed   By: Nelson Chimes M.D.   On: 03/28/2016 09:56   Mr Brain W Contrast  03/29/2016  CLINICAL DATA:  Multiple sclerosis EXAM: MRI HEAD WITH CONTRAST TECHNIQUE: Multiplanar, multiecho pulse sequences of the brain and surrounding structures were obtained with intravenous contrast. COMPARISON:  Unenhanced MRI 03/28/2016 CONTRAST:  40mL MULTIHANCE GADOBENATE DIMEGLUMINE 529 MG/ML IV SOLN FINDINGS: Right temporoparietal white matter lesion shows complete ring of enhancement. This area showed hyperintensity on T2 previously. 10 x 15 mm right frontal lesion shows complete ring of enhancement. This lesion show hyperintensity on T2 previously. No other lesion show abnormal enhancement. Multiple additional white matter lesions are present as described previously. Normal enhancement  in the posterior fossa. Ventricle size remains normal. No fluid collection or shift of the midline structures. IMPRESSION: Enhancing lesions in the right temporoparietal lobe and right frontal lobe. Based on the history and prior study these are most likely areas of active demyelinization from multiple sclerosis. Other possible etiologies for enhancing lesion include metastatic disease and infection however given the precontrast study, multiple sclerosis appears highly likely. Electronically Signed   By: Franchot Gallo M.D.   On: 03/29/2016 13:59   Mr Cervical Spine W Wo Contrast  03/29/2016  CLINICAL DATA:  Multiple sclerosis EXAM: MRI CERVICAL SPINE WITHOUT AND WITH CONTRAST TECHNIQUE: Multiplanar and multiecho pulse sequences of the cervical spine, to include the craniocervical junction and cervicothoracic junction, were obtained according to standard protocol without and with intravenous contrast. CONTRAST:  31mL MULTIHANCE GADOBENATE DIMEGLUMINE 529 MG/ML IV SOLN COMPARISON:  None. FINDINGS: Normal cervical alignment. Negative for fracture. Bone marrow signal normal. No significant degenerative change in the cervical spine. No disc protrusion or spinal stenosis. 5 mm hyperintensity in the spinal cord in the midline posteriorly at C3-4 shows mild enhancement Spinal cord lesion posterior laterally on the right at C6 shows mild enhancement. Remainder the cord signal is normal. IMPRESSION: Enhancing cord lesions at C3-4 posteriorly in the midline and at C6 posterior laterally on the right most consistent with multiple sclerosis and active demyelinization. No prior cervical MRI for comparison. Electronically Signed   By: Franchot Gallo M.D.   On: 03/29/2016 14:04   Mr Thoracic Spine W Wo Contrast  03/29/2016  CLINICAL DATA:  Multiple sclerosis EXAM: MRI THORACIC SPINE WITHOUT AND WITH CONTRAST TECHNIQUE: Multiplanar and multiecho pulse sequences of the thoracic  spine were obtained without and with intravenous  contrast. CONTRAST:  68mL MULTIHANCE GADOBENATE DIMEGLUMINE 529 MG/ML IV SOLN COMPARISON:  None. FINDINGS: Normal thoracic alignment. Negative for fracture or mass. Disc spaces are normal. No degenerative change. No significant spinal stenosis. No cord compression Spinal cord signal is normal. No evidence of demyelinating disease in the thoracic cord. Normal enhancement postcontrast infusion. No enhancing lesions identified. IMPRESSION: Normal Electronically Signed   By: Franchot Gallo M.D.   On: 03/29/2016 14:11   Dg Fluoro Guide Lumbar Puncture  03/30/2016  CLINICAL DATA:  30 year female with headache and unexplained upper extremity numbness. Under evaluation for potential multiple sclerosis. EXAM: DIAGNOSTIC LUMBAR PUNCTURE UNDER FLUOROSCOPIC GUIDANCE FLUOROSCOPY TIME:  42 seconds PROCEDURE: Informed consent was obtained from the patient prior to the procedure, including potential complications of headache, allergy, and pain. With the patient prone, the lower back was prepped with Betadine. 1% Lidocaine was used for local anesthesia. Lumbar puncture was performed at the L3-L4 level using a 20 gauge needle with return of blood-tinged CSF with an opening pressure of 20 cm water. Although CSF did become more clear throughout the examination, it remained slightly rose colored even in tube #4. 15.5 ml of CSF were obtained for laboratory studies. The patient tolerated the procedure well and there were no apparent complications. IMPRESSION: 1. Successful uncomplicated fluoroscopic guided lumbar puncture at L3-L4, as above. Electronically Signed   By: Vinnie Langton M.D.   On: 03/30/2016 15:20    Assessment and plan:   [name] is an 30 y.o. female patient with multiple sclerosis as described. Receiving IV solumedrol total 1 g a day, tomorrow will be day 5, last day.   Recommend to give 1 g in the morning and then can be discharged following the IV dose. Advised the patient to follow up  with MS specialist as out patient to initiate disease modifying therapy for MS.

## 2016-03-31 NOTE — Progress Notes (Signed)
Interval History:                                                                                                                      [name] is an 30 y.o. female patient with multiple sclerosis , with the diagnosis based on abnormal brain MRI Showing several enhancing as well as nonenhancing demyelinating lesions in the brain, and cervical spinal cord. Clinically, her right upper extremity sensory symptoms are completely resolved with IV steroids. She is tolerating IV Solu-Medrol without side effects. Today is day 3/5, planned a total of 5 days of IV Solu-Medrol 250 mg every 6 hours.    Past Medical History: Past Medical History  Diagnosis Date  . HEADACHE, CLUSTER, CHRONIC 02/06/2010  . Allergic rhinitis due to pollen 02/06/2010  . Esophageal reflux 11/29/2008  . Hypertension     Past Surgical History  Procedure Laterality Date  . No past surgeries    . Cesarean section  06/29/2012    Procedure: CESAREAN SECTION;  Surgeon: Allena Katz, MD;  Location: [facility];  Service: Obstetrics;  Laterality: N/A;    Family History: No family history on file.  Social History:   reports that she has never smoked. She does not have any smokeless tobacco history on file. She reports that she drinks alcohol. She reports that she does not use illicit drugs.  Allergies:  No Known Allergies   Medications:                                                                                                                         Current facility-administered medications:  .  0.9 %  sodium chloride infusion, , Intravenous, Continuous, Kelvin Cellar, MD, Last Rate: 75 mL/hr at 03/28/16 1239 .  acetaminophen (TYLENOL) tablet 650 mg, 650 mg, Oral, Q6H PRN, 650 mg at 03/30/16 2135 **OR** acetaminophen (TYLENOL) suppository 650 mg, 650 mg, Rectal, Q6H PRN, Kelvin Cellar, MD .  methylPREDNISolone sodium succinate (SOLU-MEDROL) 250 mg in sodium chloride 0.9 % 50 mL IVPB, 250 mg, Intravenous, Q6H,  Kelvin Cellar, MD, 250 mg at 03/31/16 0355 .  ondansetron (ZOFRAN) tablet 4 mg, 4 mg, Oral, Q6H PRN **OR** ondansetron (ZOFRAN) injection 4 mg, 4 mg, Intravenous, Q6H PRN, Kelvin Cellar, MD .  pantoprazole (PROTONIX) EC tablet 40 mg, 40 mg, Oral, Daily, Rhiannan Kievit Fuller Mandril, MD, 40 mg at 03/31/16 [phone] .  sodium chloride flush (NS) 0.9 % injection 3 mL, 3 mL, Intravenous, Q12H, Ezequiel  Coralyn Pear, MD, 3 mL at 03/30/16 1000   Neurologic Examination:                                                                                                     Today's Vitals   03/30/16 1738 03/30/16 2152 03/31/16 0200 03/31/16 0551  BP: 140/85 135/75 160/89 137/84  Pulse: 86 65 65 66  Temp: 99.2 F (37.3 C) 98.9 F (37.2 C) 98.6 F (37 C) 98.7 F (37.1 C)  TempSrc: Oral Oral Oral Oral  Resp:  18 18 18   Height:      Weight:      SpO2: 97% 98% 97% 99%  PainSc:        Evaluation of higher integrative functions including: Level of alertness: Alert,  Oriented to time, place and person Speech: fluent, no evidence of dysarthria or aphasia noted.  Test the following cranial nerves: 2-12 grossly intact Motor examination: Normal tone, bulk, full 5/5 motor strength in all 4 extremities Examination of sensation : Normal and symmetric sensation to pinprick in all 4 extremities and on face Examination of deep tendon reflexes: 2+, normal and symmetric in all extremities, normal plantars bilaterally Test coordination: Normal finger nose testing, with no evidence of limb appendicular ataxia or abnormal involuntary movements or tremors noted.  Gait: normal   Lab Results: Basic Metabolic Panel:  Recent Labs Lab 03/28/16 0120 03/30/16 0628 03/31/16 0626  NA 140 139 140  K 3.8 3.6 3.6  CL 110 105 104  CO2 21* 22 23  GLUCOSE 111* 156* 158*  BUN 11 12 12   CREATININE 0.84 0.79 0.84  CALCIUM 9.8 9.4 9.0    Liver Function Tests:  Recent Labs Lab 03/28/16 0120 03/28/16 1100 03/31/16 0626   AST 19 19 27   ALT 18 17 40  ALKPHOS 55 51 42  BILITOT 0.3 0.5 0.3  PROT 8.0 8.4* 6.5  ALBUMIN 4.4 4.6 3.2*   No results for input(s): LIPASE, AMYLASE in the last 168 hours. No results for input(s): AMMONIA in the last 168 hours.  CBC:  Recent Labs Lab 03/28/16 0120 03/30/16 0628 03/31/16 0626  WBC 7.1 16.8* 12.1*  NEUTROABS 2.7 14.9* 10.4*  HGB 12.4 11.9* 12.0  HCT 35.5* 35.1* 34.4*  MCV 84.1 84.4 83.9  PLT 286 279 276    Cardiac Enzymes: No results for input(s): CKTOTAL, CKMB, CKMBINDEX, TROPONINI in the last 168 hours.  Lipid Panel: No results for input(s): CHOL, TRIG, HDL, CHOLHDL, VLDL, LDLCALC in the last 168 hours.  CBG:  Recent Labs Lab 03/28/16 1109 03/28/16 1659 03/31/16 0634  GLUCAP 88 149* 160*    Microbiology: Results for orders placed or performed during the hospital encounter of 03/27/16  CSF culture     Status: None (Preliminary result)   Collection Time: 03/30/16  2:47 PM  Result Value Ref Range Status   Specimen Description CSF  Final   Special Requests NONE  Final   Gram Stain   Final    WBC PRESENT,BOTH PMN AND MONONUCLEAR NO ORGANISMS SEEN CYTOSPIN SMEAR    Culture PENDING  Incomplete  Report Status PENDING  Incomplete    Imaging: Dg Cervical Spine Complete  03/28/2016  CLINICAL DATA:  Right upper extremity paresthesias for 2 weeks, nontraumatic. EXAM: CERVICAL SPINE - COMPLETE 4+ VIEW COMPARISON:  None. FINDINGS: The cervical vertebrae are normal in height. There is slight reversal of cervical lordosis. There is no fracture. There is no acute soft tissue abnormality. Neural foramina are widely patent. Good preservation of intervertebral disc spaces. No bone lesion or bony destruction. IMPRESSION: Negative cervical spine radiographs. Electronically Signed   By: Andreas Newport M.D.   On: 03/28/2016 02:23   Mr Brain Wo Contrast (neuro Protocol)  03/28/2016  CLINICAL DATA:  Numbness of the right arm and hand beginning 2 weeks ago.  EXAM: MRI HEAD WITHOUT CONTRAST TECHNIQUE: Multiplanar, multiecho pulse sequences of the brain and surrounding structures were obtained without intravenous contrast. COMPARISON:  11/03/2011.  10/11/2010. FINDINGS: Diffusion imaging does not show any acute or subacute infarction. Since the previous study, there has been marked progression of white matter lesions with involvement of the left middle cerebellar peduncle and extensively throughout the cerebral hemispheric deep and subcortical white matter. The pattern and distribution are highly suggestive of active multiple sclerosis. Other causes of active demyelination are possible less likely particularly at this age. No neoplastic mass lesion, hemorrhage, hydrocephalus or extra-axial collection. No pituitary mass. No inflammatory sinus disease. Incidental retention cyst left maxillary sinus. No skull or skullbase lesion. Major vessels are patent at the base of the brain. IMPRESSION: Marked progression of white matter lesions throughout the brain since the study of 2012. The pattern and distribution is highly likely to indicate active multiple sclerosis. Electronically Signed   By: Nelson Chimes M.D.   On: 03/28/2016 09:56   Mr Brain W Contrast  03/29/2016  CLINICAL DATA:  Multiple sclerosis EXAM: MRI HEAD WITH CONTRAST TECHNIQUE: Multiplanar, multiecho pulse sequences of the brain and surrounding structures were obtained with intravenous contrast. COMPARISON:  Unenhanced MRI 03/28/2016 CONTRAST:  42mL MULTIHANCE GADOBENATE DIMEGLUMINE 529 MG/ML IV SOLN FINDINGS: Right temporoparietal white matter lesion shows complete ring of enhancement. This area showed hyperintensity on T2 previously. 10 x 15 mm right frontal lesion shows complete ring of enhancement. This lesion show hyperintensity on T2 previously. No other lesion show abnormal enhancement. Multiple additional white matter lesions are present as described previously. Normal enhancement in the posterior fossa.  Ventricle size remains normal. No fluid collection or shift of the midline structures. IMPRESSION: Enhancing lesions in the right temporoparietal lobe and right frontal lobe. Based on the history and prior study these are most likely areas of active demyelinization from multiple sclerosis. Other possible etiologies for enhancing lesion include metastatic disease and infection however given the precontrast study, multiple sclerosis appears highly likely. Electronically Signed   By: Franchot Gallo M.D.   On: 03/29/2016 13:59   Mr Cervical Spine W Wo Contrast  03/29/2016  CLINICAL DATA:  Multiple sclerosis EXAM: MRI CERVICAL SPINE WITHOUT AND WITH CONTRAST TECHNIQUE: Multiplanar and multiecho pulse sequences of the cervical spine, to include the craniocervical junction and cervicothoracic junction, were obtained according to standard protocol without and with intravenous contrast. CONTRAST:  82mL MULTIHANCE GADOBENATE DIMEGLUMINE 529 MG/ML IV SOLN COMPARISON:  None. FINDINGS: Normal cervical alignment. Negative for fracture. Bone marrow signal normal. No significant degenerative change in the cervical spine. No disc protrusion or spinal stenosis. 5 mm hyperintensity in the spinal cord in the midline posteriorly at C3-4 shows mild enhancement Spinal cord lesion posterior laterally on the right at  C6 shows mild enhancement. Remainder the cord signal is normal. IMPRESSION: Enhancing cord lesions at C3-4 posteriorly in the midline and at C6 posterior laterally on the right most consistent with multiple sclerosis and active demyelinization. No prior cervical MRI for comparison. Electronically Signed   By: Franchot Gallo M.D.   On: 03/29/2016 14:04   Mr Thoracic Spine W Wo Contrast  03/29/2016  CLINICAL DATA:  Multiple sclerosis EXAM: MRI THORACIC SPINE WITHOUT AND WITH CONTRAST TECHNIQUE: Multiplanar and multiecho pulse sequences of the thoracic spine were obtained without and with intravenous contrast. CONTRAST:   76mL MULTIHANCE GADOBENATE DIMEGLUMINE 529 MG/ML IV SOLN COMPARISON:  None. FINDINGS: Normal thoracic alignment. Negative for fracture or mass. Disc spaces are normal. No degenerative change. No significant spinal stenosis. No cord compression Spinal cord signal is normal. No evidence of demyelinating disease in the thoracic cord. Normal enhancement postcontrast infusion. No enhancing lesions identified. IMPRESSION: Normal Electronically Signed   By: Franchot Gallo M.D.   On: 03/29/2016 14:11   Dg Fluoro Guide Lumbar Puncture  03/30/2016  CLINICAL DATA:  30 year female with headache and unexplained upper extremity numbness. Under evaluation for potential multiple sclerosis. EXAM: DIAGNOSTIC LUMBAR PUNCTURE UNDER FLUOROSCOPIC GUIDANCE FLUOROSCOPY TIME:  42 seconds PROCEDURE: Informed consent was obtained from the patient prior to the procedure, including potential complications of headache, allergy, and pain. With the patient prone, the lower back was prepped with Betadine. 1% Lidocaine was used for local anesthesia. Lumbar puncture was performed at the L3-L4 level using a 20 gauge needle with return of blood-tinged CSF with an opening pressure of 20 cm water. Although CSF did become more clear throughout the examination, it remained slightly rose colored even in tube #4. 15.5 ml of CSF were obtained for laboratory studies. The patient tolerated the procedure well and there were no apparent complications. IMPRESSION: 1. Successful uncomplicated fluoroscopic guided lumbar puncture at L3-L4, as above. Electronically Signed   By: Vinnie Langton M.D.   On: 03/30/2016 15:20    Assessment and plan:   Veena Alday is an 30 y.o. female patient with multiple sclerosis , with the diagnosis based on abnormal brain MRI Showing several enhancing as well as nonenhancing demyelinating lesions in the brain, and cervical spinal cord. Clinically, her right upper extremity sensory symptoms are completely  resolved with IV steroids. She is tolerating IV Solu-Medrol without side effects. Today is day 3/5, planned a total of 5 days of IV Solu-Medrol 250 mg every 6 hours.   Attempted bedside lumbar puncture for CSF analysis to rule out MS mimics and to confirm the diagnosis with oligoclonal bands, IgG index and mild basic protein labs. Bedside lumbar puncture was not successful, due to large body habitus making it difficult to accurately assess landmarks.  Requested a fluoroscopy guided lumbar puncture.    We'll follow-up .          [id number]

## 2016-03-31 NOTE — Care Management Important Message (Signed)
Important Message  Patient Details  Name: [name] MRN: [id number] Date of Birth: 1986/11/15   Medicare Important Message Given:  Yes    Pollie Friar, RN 03/31/2016, 9:11 AM

## 2016-03-31 NOTE — Progress Notes (Signed)
[facility] Progress Note  Patient: [name] [id number]   PCP: Garret Reddish, MD DOB: 19-Apr-1986   DOA: 03/27/2016   DOS: 03/31/2016   Date of Service: the patient was seen and examined on 03/31/2016  Subjective:  Symptoms resolved -wants to walk around  Brief hospital course: Patient was admitted on 03/27/2016, with complaint of right hand weakness and numbness, was found to have suspected MS exacerbation and she was brought to [facility] for further workup and treatment. Currently further plan is continue steroids and follow recommendation from neurology.  Assessment and Plan:  Multiple sclerosis exacerbation ([place]) MRI is positive for MS. Currently on high-dose Solu-Medrol 4/5 days We will await recommendation from neurology. s/p lumbar puncture with IR guidance.  GERD. Continue PPI.  Chronic headache.  patient mentions she has chronic headache as well as leg spasm and for which she has been following up with Dr. Trenton Gammon from neurosurgery and during the workup she was found to have some lesions on the MRI but they were not appearing acute and therefore she was recommended to continue monitoring for acute symptoms.   Pain management: When necessary Tylenol Activity: Currently independent Diet: Regular diet DVT Prophylaxis: subcutaneous Heparin  Advance goals of care discussion: Full code  Family Communication: family was present at bedside   Disposition:  Discharge to home Expected discharge date: 04/01/2016  Consultants: Neurology Procedures: IR guided lumbar puncture  Antibiotics: Anti-infectives    None       No intake or output data in the 24 hours ending 03/31/16 1322 Filed Weights   03/28/16 0001 03/28/16 2206  Weight: 81.194 kg (179 lb) 85.458 kg (188 lb 6.4 oz)    Objective: Physical Exam: Filed Vitals:   03/30/16 2152 03/31/16 0200 03/31/16 0551 03/31/16 1033  BP: 135/75 160/89 137/84 139/86  Pulse: 65 65 66 82  Temp:  98.9 F (37.2 C) 98.6 F (37 C) 98.7 F (37.1 C) 98.6 F (37 C)  TempSrc: Oral Oral Oral Oral  Resp: 18 18 18 18   Height:      Weight:      SpO2: 98% 97% 99% 97%    General: Alert, Awake and Oriented to Time, Place and Person. Appear in no distress Cardiovascular: S1 and S2 Present, no Murmur, Peripheral Pulses Present Respiratory: Bilateral Air entry equal and Decreased, Clear to Auscultation, no Crackles, no wheezes Abdomen: Bowel Sound present, Soft and no tenderness Skin: no redness, no Rash  Neurologic: Grossly no focal neuro deficit. Bilaterally Equal motor strength, No numbness of the upper extremity  Data Reviewed: CBC:  Recent Labs Lab 03/28/16 0120 03/30/16 0628 03/31/16 0626  WBC 7.1 16.8* 12.1*  NEUTROABS 2.7 14.9* 10.4*  HGB 12.4 11.9* 12.0  HCT 35.5* 35.1* 34.4*  MCV 84.1 84.4 83.9  PLT 286 279 [id number]   Basic Metabolic Panel:  Recent Labs Lab 03/28/16 0120 03/30/16 0628 03/31/16 0626  NA 140 139 140  K 3.8 3.6 3.6  CL 110 105 104  CO2 21* 22 23  GLUCOSE 111* 156* 158*  BUN 11 12 12   CREATININE 0.84 0.79 0.84  CALCIUM 9.8 9.4 9.0    Liver Function Tests:  Recent Labs Lab 03/28/16 0120 03/28/16 1100 03/31/16 0626  AST 19 19 27   ALT 18 17 40  ALKPHOS 55 51 42  BILITOT 0.3 0.5 0.3  PROT 8.0 8.4* 6.5  ALBUMIN 4.4 4.6 3.2*   No results for input(s): LIPASE, AMYLASE in the last 168 hours. No results for input(s):  AMMONIA in the last 168 hours. Coagulation Profile:  Recent Labs Lab 03/28/16 1100  INR 1.21   Cardiac Enzymes: No results for input(s): CKTOTAL, CKMB, CKMBINDEX, TROPONINI in the last 168 hours. BNP (last 3 results) No results for input(s): PROBNP in the last 8760 hours.  CBG:  Recent Labs Lab 03/28/16 1109 03/28/16 1659 03/31/16 0634 03/31/16 1102  GLUCAP 88 149* 160* 265*    Studies: Dg Fluoro Guide Lumbar Puncture  03/30/2016  CLINICAL DATA:  31 year female with headache and unexplained upper  extremity numbness. Under evaluation for potential multiple sclerosis. EXAM: DIAGNOSTIC LUMBAR PUNCTURE UNDER FLUOROSCOPIC GUIDANCE FLUOROSCOPY TIME:  42 seconds PROCEDURE: Informed consent was obtained from the patient prior to the procedure, including potential complications of headache, allergy, and pain. With the patient prone, the lower back was prepped with Betadine. 1% Lidocaine was used for local anesthesia. Lumbar puncture was performed at the L3-L4 level using a 20 gauge needle with return of blood-tinged CSF with an opening pressure of 20 cm water. Although CSF did become more clear throughout the examination, it remained slightly rose colored even in tube #4. 15.5 ml of CSF were obtained for laboratory studies. The patient tolerated the procedure well and there were no apparent complications. IMPRESSION: 1. Successful uncomplicated fluoroscopic guided lumbar puncture at L3-L4, as above. Electronically Signed   By: Vinnie Langton M.D.   On: 03/30/2016 15:20     Scheduled Meds: . methylPREDNISolone (SOLU-MEDROL) injection  250 mg Intravenous Q6H  . pantoprazole  40 mg Oral Daily  . sodium chloride flush  3 mL Intravenous Q12H   Continuous Infusions: . sodium chloride 75 mL/hr at 03/28/16 1239   PRN Meds: acetaminophen **OR** acetaminophen, ondansetron **OR** ondansetron (ZOFRAN) IV  Time spent: 25 minutes  Author: Eulogio Bear DO [facility] Pager: [phone]  03/31/2016 1:22 PM  If 7PM-7AM, please contact night-coverage at [email or web address], password [facility]

## 2016-03-31 NOTE — Procedures (Signed)
LP Procedure Note:  Patient has been seen and examined.  Chart has been reviewed.  LP is being performed to evaluate for multiple sclerosis, and its mimics.  Procedure has been explained to patient including risks and benefits.    Consent has been signed by patient and witnessed.   Blood pressure 137/84, pulse 66, temperature 98.7 F (37.1 C), temperature source Oral, resp. rate 18, height 5\' 5"  (1.651 m), weight 85.458 kg (188 lb 6.4 oz), last menstrual period 03/08/2016, SpO2 99 %.   Current facility-administered medications:  .  0.9 %  sodium chloride infusion, , Intravenous, Continuous, Kelvin Cellar, MD, Last Rate: 75 mL/hr at 03/28/16 1239 .  acetaminophen (TYLENOL) tablet 650 mg, 650 mg, Oral, Q6H PRN, 650 mg at 03/30/16 2135 **OR** acetaminophen (TYLENOL) suppository 650 mg, 650 mg, Rectal, Q6H PRN, Kelvin Cellar, MD .  methylPREDNISolone sodium succinate (SOLU-MEDROL) 250 mg in sodium chloride 0.9 % 50 mL IVPB, 250 mg, Intravenous, Q6H, Kelvin Cellar, MD, 250 mg at 03/31/16 0355 .  ondansetron (ZOFRAN) tablet 4 mg, 4 mg, Oral, Q6H PRN **OR** ondansetron (ZOFRAN) injection 4 mg, 4 mg, Intravenous, Q6H PRN, Kelvin Cellar, MD .  pantoprazole (PROTONIX) EC tablet 40 mg, 40 mg, Oral, Daily, Zakayla Martinec Fuller Mandril, MD, 40 mg at 03/31/16 [phone] .  sodium chloride flush (NS) 0.9 % injection 3 mL, 3 mL, Intravenous, Q12H, Kelvin Cellar, MD, 3 mL at 03/30/16 1000  Procedure description   Patient was placed in the  right lateral  decubitus position.  using sterile technique, and standard bony landmarks, 20 G LP needle was introduced into L3-L4 interspinous space. Due to large body habitus, tip of the standard LP needle could not reach even the pedicle level. Procedure was aborted, and CSF could not be obtained . There were no immediate bleeding or pain complications noted. We'll request fluoroscopy guided lumbar puncture.

## 2016-04-01 LAB — GLUCOSE, CAPILLARY: Glucose-Capillary: 144 mg/dL — ABNORMAL HIGH (ref 65–99)

## 2016-04-01 LAB — PATHOLOGIST SMEAR REVIEW

## 2016-04-01 LAB — OLIGOCLONAL BANDS, CSF + SERM

## 2016-04-01 MED ORDER — FAMOTIDINE 20 MG PO TABS
20.0000 mg | ORAL_TABLET | Freq: Two times a day (BID) | ORAL | Status: DC | PRN
  Administered 2016-04-01: 20 mg via ORAL
  Filled 2016-04-01: qty 1

## 2016-04-01 MED ORDER — TRIAMTERENE-HCTZ 37.5-25 MG PO TABS: 1.0000 | ORAL_TABLET | Freq: Every day | ORAL | Status: DC

## 2016-04-01 MED ORDER — METHYLPREDNISOLONE SODIUM SUCC 1000 MG IJ SOLR
750.0000 mg | Freq: Once | INTRAMUSCULAR | Status: AC
  Administered 2016-04-01: 750 mg via INTRAVENOUS
  Filled 2016-04-01: qty 6

## 2016-04-01 MED ORDER — VERAPAMIL HCL ER 180 MG PO TBCR
180.0000 mg | EXTENDED_RELEASE_TABLET | Freq: Every day | ORAL | Status: DC
  Administered 2016-04-01: 180 mg via ORAL
  Filled 2016-04-01: qty 1

## 2016-04-01 MED ORDER — LIDOCAINE 5 % EX PTCH
1.0000 | MEDICATED_PATCH | Freq: Every day | CUTANEOUS | Status: DC
  Administered 2016-04-01: 1 via TRANSDERMAL
  Filled 2016-04-01: qty 1

## 2016-04-01 MED ORDER — TRIAMTERENE-HCTZ 37.5-25 MG PO TABS
1.0000 | ORAL_TABLET | Freq: Every day | ORAL | Status: DC
  Administered 2016-04-01: 1 via ORAL
  Filled 2016-04-01: qty 1

## 2016-04-01 MED ORDER — VERAPAMIL HCL ER 180 MG PO TBCR: 180.0000 mg | EXTENDED_RELEASE_TABLET | Freq: Every day | ORAL | Status: DC

## 2016-04-01 NOTE — Progress Notes (Signed)
Pt discharged from hospital per orders from MD. Pt and family educated on discharge instructions. Pt and family verbalized understanding of instructions. Pt's IV was removed before discharge. All questions and concerns were addressed. Pt exited hospital via ambulation.

## 2016-04-01 NOTE — Progress Notes (Signed)
Interval History:                                                                                                                      [name] is an 30 y.o. female patient with multiple sclerosis , with the diagnosis based on abnormal brain MRI Showing several enhancing as well as nonenhancing demyelinating lesions in the brain, and cervical spinal cord. Clinically, her right upper extremity sensory symptoms are completely resolved with IV steroids.    Past Medical History: Past Medical History  Diagnosis Date  . HEADACHE, CLUSTER, CHRONIC 02/06/2010  . Allergic rhinitis due to pollen 02/06/2010  . Esophageal reflux 11/29/2008  . Hypertension     Past Surgical History  Procedure Laterality Date  . No past surgeries    . Cesarean section  06/29/2012    Procedure: CESAREAN SECTION;  Surgeon: Allena Katz, MD;  Location: [facility];  Service: Obstetrics;  Laterality: N/A;    Family History: No family history on file.  Social History:   reports that she has never smoked. She does not have any smokeless tobacco history on file. She reports that she drinks alcohol. She reports that she does not use illicit drugs.  Allergies:  No Known Allergies   Medications:                                                                                                                         Current facility-administered medications:  .  0.9 %  sodium chloride infusion, , Intravenous, Continuous, Kelvin Cellar, MD, Last Rate: 75 mL/hr at 03/28/16 1239 .  acetaminophen (TYLENOL) tablet 650 mg, 650 mg, Oral, Q6H PRN, 650 mg at 03/31/16 1612 **OR** acetaminophen (TYLENOL) suppository 650 mg, 650 mg, Rectal, Q6H PRN, Kelvin Cellar, MD .  famotidine (PEPCID) tablet 20 mg, 20 mg, Oral, BID PRN, Gardiner Barefoot, NP, 20 mg at 04/01/16 0348 .  methylPREDNISolone sodium succinate (SOLU-MEDROL) 250 mg in sodium chloride 0.9 % 50 mL IVPB, 250 mg, Intravenous, Q6H, Kelvin Cellar, MD, 250 mg  at 04/01/16 0349 .  ondansetron (ZOFRAN) tablet 4 mg, 4 mg, Oral, Q6H PRN **OR** ondansetron (ZOFRAN) injection 4 mg, 4 mg, Intravenous, Q6H PRN, Kelvin Cellar, MD .  pantoprazole (PROTONIX) EC tablet 40 mg, 40 mg, Oral, Daily, Ram Fuller Mandril, MD, 40 mg at 03/31/16 [phone] .  sodium chloride flush (NS) 0.9 % injection 3 mL, 3 mL, Intravenous, Q12H, Kelvin Cellar, MD, 3 mL at  03/31/16 1006 .  triamterene-hydrochlorothiazide (MAXZIDE-25) 37.5-25 MG per tablet 1 tablet, 1 tablet, Oral, Daily, Jessica U Vann, DO .  verapamil (CALAN-SR) CR tablet 180 mg, 180 mg, Oral, QHS, Geradine Girt, DO   Neurologic Examination:                                                                                                     Today's Vitals   03/31/16 2234 04/01/16 0309 04/01/16 0932 04/01/16 0933  BP: 155/77 166/102 155/100 165/94  Pulse: 50 63  59  Temp: 98.5 F (36.9 C) 98.3 F (36.8 C)  98 F (36.7 C)  TempSrc: Oral Oral  Oral  Resp: 18 18  18   Height:      Weight:      SpO2: 98% 99%  99%  PainSc:        Evaluation of higher integrative functions including: Level of alertness: Alert,  Oriented to time, place and person Speech: fluent, no evidence of dysarthria or aphasia noted.  Test the following cranial nerves: 2-12 grossly intact Motor examination: Normal tone, bulk, full 5/5 motor strength in all 4 extremities Examination of sensation : Normal and symmetric sensation to pinprick in all 4 extremities and on face Examination of deep tendon reflexes: 2+, normal and symmetric in all extremities, normal plantars bilaterally Test coordination: Normal finger nose testing, with no evidence of limb appendicular ataxia or abnormal involuntary movements or tremors noted.  Gait: Deferred   Lab Results: Basic Metabolic Panel:  Recent Labs Lab 03/28/16 0120 03/30/16 0628 03/31/16 0626  NA 140 139 140  K 3.8 3.6 3.6  CL 110 105 104  CO2 21* 22 23  GLUCOSE 111* 156* 158*  BUN  11 12 12   CREATININE 0.84 0.79 0.84  CALCIUM 9.8 9.4 9.0    Liver Function Tests:  Recent Labs Lab 03/28/16 0120 03/28/16 1100 03/31/16 0626  AST 19 19 27   ALT 18 17 40  ALKPHOS 55 51 42  BILITOT 0.3 0.5 0.3  PROT 8.0 8.4* 6.5  ALBUMIN 4.4 4.6 3.2*   No results for input(s): LIPASE, AMYLASE in the last 168 hours. No results for input(s): AMMONIA in the last 168 hours.  CBC:  Recent Labs Lab 03/28/16 0120 03/30/16 0628 03/31/16 0626  WBC 7.1 16.8* 12.1*  NEUTROABS 2.7 14.9* 10.4*  HGB 12.4 11.9* 12.0  HCT 35.5* 35.1* 34.4*  MCV 84.1 84.4 83.9  PLT 286 279 276    Cardiac Enzymes: No results for input(s): CKTOTAL, CKMB, CKMBINDEX, TROPONINI in the last 168 hours.  Lipid Panel: No results for input(s): CHOL, TRIG, HDL, CHOLHDL, VLDL, LDLCALC in the last 168 hours.  CBG:  Recent Labs Lab 03/28/16 1109 03/28/16 1659 03/31/16 0634 03/31/16 1102 03/31/16 1610  GLUCAP 88 149* 160* 265* 200*    Microbiology: Results for orders placed or performed during the hospital encounter of 03/27/16  CSF culture     Status: None (Preliminary result)   Collection Time: 03/30/16  2:47 PM  Result Value Ref Range Status   Specimen Description CSF  Final   Special  Requests NONE  Final   Gram Stain   Final    WBC PRESENT,BOTH PMN AND MONONUCLEAR NO ORGANISMS SEEN CYTOSPIN SMEAR    Culture NO GROWTH < 24 HOURS  Final   Report Status PENDING  Incomplete    Imaging: Mr Jeri Cos Contrast  03/29/2016  CLINICAL DATA:  Multiple sclerosis EXAM: MRI HEAD WITH CONTRAST TECHNIQUE: Multiplanar, multiecho pulse sequences of the brain and surrounding structures were obtained with intravenous contrast. COMPARISON:  Unenhanced MRI 03/28/2016 CONTRAST:  25mL MULTIHANCE GADOBENATE DIMEGLUMINE 529 MG/ML IV SOLN FINDINGS: Right temporoparietal white matter lesion shows complete ring of enhancement. This area showed hyperintensity on T2 previously. 10 x 15 mm right frontal lesion shows  complete ring of enhancement. This lesion show hyperintensity on T2 previously. No other lesion show abnormal enhancement. Multiple additional white matter lesions are present as described previously. Normal enhancement in the posterior fossa. Ventricle size remains normal. No fluid collection or shift of the midline structures. IMPRESSION: Enhancing lesions in the right temporoparietal lobe and right frontal lobe. Based on the history and prior study these are most likely areas of active demyelinization from multiple sclerosis. Other possible etiologies for enhancing lesion include metastatic disease and infection however given the precontrast study, multiple sclerosis appears highly likely. Electronically Signed   By: Franchot Gallo M.D.   On: 03/29/2016 13:59   Mr Cervical Spine W Wo Contrast  03/29/2016  CLINICAL DATA:  Multiple sclerosis EXAM: MRI CERVICAL SPINE WITHOUT AND WITH CONTRAST TECHNIQUE: Multiplanar and multiecho pulse sequences of the cervical spine, to include the craniocervical junction and cervicothoracic junction, were obtained according to standard protocol without and with intravenous contrast. CONTRAST:  19mL MULTIHANCE GADOBENATE DIMEGLUMINE 529 MG/ML IV SOLN COMPARISON:  None. FINDINGS: Normal cervical alignment. Negative for fracture. Bone marrow signal normal. No significant degenerative change in the cervical spine. No disc protrusion or spinal stenosis. 5 mm hyperintensity in the spinal cord in the midline posteriorly at C3-4 shows mild enhancement Spinal cord lesion posterior laterally on the right at C6 shows mild enhancement. Remainder the cord signal is normal. IMPRESSION: Enhancing cord lesions at C3-4 posteriorly in the midline and at C6 posterior laterally on the right most consistent with multiple sclerosis and active demyelinization. No prior cervical MRI for comparison. Electronically Signed   By: Franchot Gallo M.D.   On: 03/29/2016 14:04   Mr Thoracic Spine W Wo  Contrast  03/29/2016  CLINICAL DATA:  Multiple sclerosis EXAM: MRI THORACIC SPINE WITHOUT AND WITH CONTRAST TECHNIQUE: Multiplanar and multiecho pulse sequences of the thoracic spine were obtained without and with intravenous contrast. CONTRAST:  85mL MULTIHANCE GADOBENATE DIMEGLUMINE 529 MG/ML IV SOLN COMPARISON:  None. FINDINGS: Normal thoracic alignment. Negative for fracture or mass. Disc spaces are normal. No degenerative change. No significant spinal stenosis. No cord compression Spinal cord signal is normal. No evidence of demyelinating disease in the thoracic cord. Normal enhancement postcontrast infusion. No enhancing lesions identified. IMPRESSION: Normal Electronically Signed   By: Franchot Gallo M.D.   On: 03/29/2016 14:11   Dg Fluoro Guide Lumbar Puncture  03/30/2016  CLINICAL DATA:  30 year female with headache and unexplained upper extremity numbness. Under evaluation for potential multiple sclerosis. EXAM: DIAGNOSTIC LUMBAR PUNCTURE UNDER FLUOROSCOPIC GUIDANCE FLUOROSCOPY TIME:  42 seconds PROCEDURE: Informed consent was obtained from the patient prior to the procedure, including potential complications of headache, allergy, and pain. With the patient prone, the lower back was prepped with Betadine. 1% Lidocaine was used for local anesthesia. Lumbar  puncture was performed at the L3-L4 level using a 20 gauge needle with return of blood-tinged CSF with an opening pressure of 20 cm water. Although CSF did become more clear throughout the examination, it remained slightly rose colored even in tube #4. 15.5 ml of CSF were obtained for laboratory studies. The patient tolerated the procedure well and there were no apparent complications. IMPRESSION: 1. Successful uncomplicated fluoroscopic guided lumbar puncture at L3-L4, as above. Electronically Signed   By: Vinnie Langton M.D.   On: 03/30/2016 15:20    Assessment and plan:   Yekaterina Laramie is an 30 y.o. female patient with MS.  She has one further full dose of 1000 mg Solumedrol to be given. Symptoms fully resolved. After he last dose may be discharged and will need to follow up with out patient neurology where she lived. Neurology will S/O  Cathy Quill PA-C [facility] [phone]  04/01/2016, 10:05 AM

## 2016-04-01 NOTE — Discharge Summary (Addendum)
Physician Discharge Summary  [name] [id number] DOB: [birth year] DOA: 03/27/2016  PCP: Garret Reddish, MD  Admit date: 03/27/2016 Discharge date: 04/01/2016   Recommendations for Outpatient Follow-Up:   1. Referral made to Dr. Felecia Shelling for MS follow up 2. Needs to see PCP for BP check   Discharge Diagnosis:   Principal Problem:   Multiple sclerosis exacerbation ([place]) Active Problems:   Esophageal reflux   Headache   Discharge disposition:  Home  Discharge Condition: Improved.  Diet recommendation: Low sodium, heart healthy  Wound care: None.   History of Present Illness:   [name] is a 30 y.o. female with medical history significant of cluster headaches, presenting to the emergency department with complaints of right upper extremity numbness and tingling associate with weakness becoming progressively worse over the past 2 weeks. She reports being unable to carry objects with her right hand, with the muscle strength of digits #3 4 and 5 significantly affected. She denies any other focal neurological deficits or visual changes. She states occasionally having "shaking" to her lower extremities however without associated weakness. She denies shortness of breath, chest pain, abdominal pain, fevers, chills. She has a history of a solitary white matter lesion seen on MRI of brain in 2012. At the time radiology raised the question of this representing multiple sclerosis.  ED Course: In the emergency department she had a repeat MRI of brain without contrast that revealed markedly progression of white matter lesions throughout the brain since the 2012 study, radiology reporting highly consistent with multiple sclerosis. Emergency room provider discussed case with Dr.Nandigam of neurology recommending initiating steroid therapy with Solu-Medrol 250 mg IV every 6 hours. Neurology also recommending lumbar puncture and transferred to [facility] for further  workup.   Hospital Course by Problem:   Multiple sclerosis exacerbation (HCC) MRI is positive for MS. s/p high-dose Solu-Medrol 5/5 days s/p lumbar puncture with IR guidance.  GERD. Continue PPI.  Chronic headache.  patient mentions she has chronic headache as well as leg spasm and for which she has been following up with Dr. Trenton Gammon from neurosurgery and during the workup she was found to have some lesions on the MRI but they were not appearing acute and therefore she was recommended to continue monitoring for acute symptoms.  HTN -resume 1/2 home meds-- has not taken for 3 years Follow up PCP for BP check and possible resumption of 2nd med  Medical Consultants:    neurology   Discharge Exam:   Filed Vitals:   04/01/16 0932 04/01/16 0933  BP: 155/100 165/94  Pulse:  59  Temp:  98 F (36.7 C)  Resp:  18   Filed Vitals:   03/31/16 2234 04/01/16 0309 04/01/16 0932 04/01/16 0933  BP: 155/77 166/102 155/100 165/94  Pulse: 50 63  59  Temp: 98.5 F (36.9 C) 98.3 F (36.8 C)  98 F (36.7 C)  TempSrc: Oral Oral  Oral  Resp: 18 18  18   Height:      Weight:      SpO2: 98% 99%  99%    Gen:  NAD, anxious to go home   The results of significant diagnostics from this hospitalization (including imaging, microbiology, ancillary and laboratory) are listed below for reference.     Procedures and Diagnostic Studies:   Dg Cervical Spine Complete  03/28/2016  CLINICAL DATA:  Right upper extremity paresthesias for 2 weeks, nontraumatic. EXAM: CERVICAL SPINE - COMPLETE 4+ VIEW COMPARISON:  None. FINDINGS: The  cervical vertebrae are normal in height. There is slight reversal of cervical lordosis. There is no fracture. There is no acute soft tissue abnormality. Neural foramina are widely patent. Good preservation of intervertebral disc spaces. No bone lesion or bony destruction. IMPRESSION: Negative cervical spine radiographs. Electronically Signed   By: Andreas Newport M.D.   On:  03/28/2016 02:23   Mr Brain Wo Contrast (neuro Protocol)  03/28/2016  CLINICAL DATA:  Numbness of the right arm and hand beginning 2 weeks ago. EXAM: MRI HEAD WITHOUT CONTRAST TECHNIQUE: Multiplanar, multiecho pulse sequences of the brain and surrounding structures were obtained without intravenous contrast. COMPARISON:  11/03/2011.  10/11/2010. FINDINGS: Diffusion imaging does not show any acute or subacute infarction. Since the previous study, there has been marked progression of white matter lesions with involvement of the left middle cerebellar peduncle and extensively throughout the cerebral hemispheric deep and subcortical white matter. The pattern and distribution are highly suggestive of active multiple sclerosis. Other causes of active demyelination are possible less likely particularly at this age. No neoplastic mass lesion, hemorrhage, hydrocephalus or extra-axial collection. No pituitary mass. No inflammatory sinus disease. Incidental retention cyst left maxillary sinus. No skull or skullbase lesion. Major vessels are patent at the base of the brain. IMPRESSION: Marked progression of white matter lesions throughout the brain since the study of 2012. The pattern and distribution is highly likely to indicate active multiple sclerosis. Electronically Signed   By: Nelson Chimes M.D.   On: 03/28/2016 09:56     Labs:   Basic Metabolic Panel:  Recent Labs Lab 03/28/16 0120 03/30/16 0628 03/31/16 0626  NA 140 139 140  K 3.8 3.6 3.6  CL 110 105 104  CO2 21* 22 23  GLUCOSE 111* 156* 158*  BUN 11 12 12   CREATININE 0.84 0.79 0.84  CALCIUM 9.8 9.4 9.0   GFR Estimated Creatinine Clearance: 106.7 mL/min (by C-G formula based on Cr of 0.84). Liver Function Tests:  Recent Labs Lab 03/28/16 0120 03/28/16 1100 03/31/16 0626  AST 19 19 27   ALT 18 17 40  ALKPHOS 55 51 42  BILITOT 0.3 0.5 0.3  PROT 8.0 8.4* 6.5  ALBUMIN 4.4 4.6 3.2*   No results for input(s): LIPASE, AMYLASE in the last  168 hours. No results for input(s): AMMONIA in the last 168 hours. Coagulation profile  Recent Labs Lab 03/28/16 1100  INR 1.21    CBC:  Recent Labs Lab 03/28/16 0120 03/30/16 0628 03/31/16 0626  WBC 7.1 16.8* 12.1*  NEUTROABS 2.7 14.9* 10.4*  HGB 12.4 11.9* 12.0  HCT 35.5* 35.1* 34.4*  MCV 84.1 84.4 83.9  PLT 286 279 276   Cardiac Enzymes: No results for input(s): CKTOTAL, CKMB, CKMBINDEX, TROPONINI in the last 168 hours. BNP: Invalid input(s): POCBNP CBG:  Recent Labs Lab 03/28/16 1659 03/31/16 0634 03/31/16 1102 03/31/16 1610 04/01/16 1108  GLUCAP 149* 160* 265* 200* 144*   D-Dimer No results for input(s): DDIMER in the last 72 hours. Hgb A1c No results for input(s): HGBA1C in the last 72 hours. Lipid Profile No results for input(s): CHOL, HDL, LDLCALC, TRIG, CHOLHDL, LDLDIRECT in the last 72 hours. Thyroid function studies No results for input(s): TSH, T4TOTAL, T3FREE, THYROIDAB in the last 72 hours.  Invalid input(s): FREET3 Anemia work up No results for input(s): VITAMINB12, FOLATE, FERRITIN, TIBC, IRON, RETICCTPCT in the last 72 hours. Microbiology Recent Results (from the past 240 hour(s))  CSF culture     Status: None (Preliminary result)   Collection  Time: 03/30/16  2:47 PM  Result Value Ref Range Status   Specimen Description CSF  Final   Special Requests NONE  Final   Gram Stain   Final    WBC PRESENT,BOTH PMN AND MONONUCLEAR NO ORGANISMS SEEN CYTOSPIN SMEAR    Culture NO GROWTH 2 DAYS  Final   Report Status PENDING  Incomplete     Discharge Instructions:   Discharge Instructions    Diet - low sodium heart healthy    Complete by:  As directed      Discharge instructions    Complete by:  As directed   BP check with PCP 1 week - may need to resume verapamil if still elevated     Increase activity slowly    Complete by:  As directed             Medication List    STOP taking these medications         amoxicillin-clavulanate 875-125 MG tablet  Commonly known as:  AUGMENTIN     guaiFENesin-codeine 100-10 MG/5ML syrup     ipratropium 0.06 % nasal spray  Commonly known as:  ATROVENT     predniSONE 10 MG tablet  Commonly known as:  DELTASONE     verapamil 180 MG CR tablet  Commonly known as:  CALAN-SR      TAKE these medications        fluticasone 50 MCG/ACT nasal spray  Commonly known as:  FLONASE  Place 1 spray into both nostrils daily as needed for allergies or rhinitis.     prenatal multivitamin Tabs tablet  Take 1 tablet by mouth every morning.     triamterene-hydrochlorothiazide 37.5-25 MG tablet  Commonly known as:  MAXZIDE-25  Take 1 tablet by mouth daily.           Follow-up Information    Follow up with Garret Reddish, MD In 1 week.   Specialty:  Family Medicine   Contact information:   [place] [address] [phone]                 Time coordinating discharge: 35 min  Signed:  Juanna Pudlo Alison Stalling   [facility] 04/01/2016, 12:48 PM

## 2016-04-02 ENCOUNTER — Telehealth: Payer: Self-pay | Admitting: Family Medicine

## 2016-04-02 LAB — ANGIOTENSIN CONVERTING ENZYME, CSF: Angio Convert Enzyme: 0.7 U/L (ref 0.0–2.5)

## 2016-04-02 NOTE — Telephone Encounter (Signed)
Mom called to request a hospital follow up appt for this pt, her daughter. Mom states pt just was dc'd from hospital, and pt was also dx with MS.  However, pt had 2 establish appointment with you this past December and cancelled both of them. Mom states pt lives in [place], [place].  So not sure exactly what they are trying to do. Mom states pt bp is a concern as well.

## 2016-04-02 NOTE — Telephone Encounter (Signed)
May work her in Friday the 26th at 11:30 AM. She will technically be new patient given how long since she has been seen.   I am going to see her for BP follow up but she is going to try to establish in new location (she is in [place] now per her report)

## 2016-04-03 LAB — CSF CULTURE W GRAM STAIN

## 2016-04-03 LAB — CSF CULTURE: CULTURE: NO GROWTH

## 2016-04-05 ENCOUNTER — Telehealth: Payer: Self-pay | Admitting: *Deleted

## 2016-04-05 NOTE — Telephone Encounter (Signed)
Message For: OFFICE               Taken 22-MAY-17 at  2:06PM by JAK ------------------------------------------------------------ Caller Sarita Haver / MOTHER    CID [id number]  Patient [name]         Pt's Dr Felecia Shelling        Area Code 336 Phone# [phone] * DOB [birth year]     RE NEW PATIENT / WISHES TO SCHEDULE AN APPT                                                               Disp:Y/N Y If Y = C/B If No Response In 71minutes =====

## 2016-04-05 NOTE — Telephone Encounter (Signed)
Pt has been scheduled for Friday at 11:30.  

## 2016-04-05 NOTE — Telephone Encounter (Signed)
------------------------------------------------------------   [name]                CID [id number]  Patient [name]         Pt's Dr Felecia Shelling        Area Code 336 Phone# [phone]     RE NEEDS TO MAKE AN APPT FOR THE PT, WOULD LIKE A    CALL BACK WHENEVER POSSIBLE TO SETUP THE APPT        Disp:Y/N N If Y = C/B If No Response In 70minutes ============================================================

## 2016-04-09 ENCOUNTER — Encounter: Payer: Self-pay | Admitting: Family Medicine

## 2016-04-09 ENCOUNTER — Ambulatory Visit (INDEPENDENT_AMBULATORY_CARE_PROVIDER_SITE_OTHER): Payer: 59 | Admitting: Family Medicine

## 2016-04-09 ENCOUNTER — Encounter: Payer: Self-pay | Admitting: Neurology

## 2016-04-09 ENCOUNTER — Ambulatory Visit (INDEPENDENT_AMBULATORY_CARE_PROVIDER_SITE_OTHER): Payer: 59 | Admitting: Neurology

## 2016-04-09 VITALS — BP 118/82 | HR 99 | Temp 98.2°F | Ht 63.5 in | Wt 181.0 lb

## 2016-04-09 VITALS — BP 135/93 | HR 86 | Ht 65.0 in | Wt 181.0 lb

## 2016-04-09 DIAGNOSIS — R5383 Other fatigue: Secondary | ICD-10-CM | POA: Insufficient documentation

## 2016-04-09 DIAGNOSIS — I10 Essential (primary) hypertension: Secondary | ICD-10-CM | POA: Diagnosis not present

## 2016-04-09 DIAGNOSIS — Z79899 Other long term (current) drug therapy: Secondary | ICD-10-CM | POA: Diagnosis not present

## 2016-04-09 DIAGNOSIS — G35 Multiple sclerosis: Secondary | ICD-10-CM | POA: Diagnosis not present

## 2016-04-09 DIAGNOSIS — G959 Disease of spinal cord, unspecified: Secondary | ICD-10-CM

## 2016-04-09 DIAGNOSIS — R350 Frequency of micturition: Secondary | ICD-10-CM | POA: Diagnosis not present

## 2016-04-09 DIAGNOSIS — R200 Anesthesia of skin: Secondary | ICD-10-CM | POA: Insufficient documentation

## 2016-04-09 LAB — B. BURGDORFI ANTIBODIES, CSF
ALBUMIN CSF: 18.7 mg/dL (ref 10.4–43.2)
ALBUMIN INDEX RATIO: 4.9
Albumin Serum: 3800 mg/dl (ref 3700–5100)
B. Burgdorferi IgM Serum: 3.89 Index — ABNORMAL HIGH (ref 0.00–0.80)
B. burgdorferi IgM CSF: 0.45 Index — ABNORMAL HIGH (ref 0.00–0.06)
CNS IgG Synthesis Rate: 34.2
IGG TOTAL CSF: 8.9 mg/dL — AB (ref 0.00–3.06)
IGG TOTAL SERUM: 696 mg/dL (ref 540–1423)
IgG (loc): 6.665
IgG Index: 2.6
IgM Total CSF: <1.6 mg/dL (ref 0.0–1.6)
IgM Total Serum: 92 mg/dl (ref 52–217)

## 2016-04-09 MED ORDER — TRIAMTERENE-HCTZ 37.5-25 MG PO TABS: 1.0000 | ORAL_TABLET | Freq: Every day | ORAL | Status: DC

## 2016-04-09 NOTE — Patient Instructions (Signed)
Blood pressure looks great today on 2 checks. Goal is <140/90. I would advise you to check if you are out in stores at least a few times a month while you attempt to establish with primary care in [place]. Please get on this quickly as sometimes it is hard to get into offices to establish care.

## 2016-04-09 NOTE — Assessment & Plan Note (Signed)
Recent hospitalization for MS- some residual right sided tingling in hand- improving. Saw Dr. Billey Chang and follow up through his office planned.

## 2016-04-09 NOTE — Assessment & Plan Note (Signed)
S: previously on verapamil but in hospital BP ran higher and was changed to triamterene hctz 37.5-25mg  due to higher BPS and encouraged PCP follow up A/P: Blood pressure looks great today. Gave 9 months of rx. Lives in [place] and encouraged her to get established locally though I was willing to help in her transition time obviously. BP was high at neurologist earlier today but on 2 separate checks <120/85. She has home cuff and given advisement for monitoring per avs.

## 2016-04-09 NOTE — Progress Notes (Signed)
Phone: [phone]  Subjective:  Patient presents today to establish care. Chief complaint-noted.   See problem oriented charting  The following were reviewed and entered/updated in epic: Past Medical History  Diagnosis Date  . HEADACHE, CLUSTER, CHRONIC 02/06/2010    patient reports migraines, not cluster headaches from problem list  . Allergic rhinitis due to pollen 02/06/2010  . Esophageal reflux 11/29/2008  . Hypertension   . Multiple sclerosis [facility])    Patient Active Problem List   Diagnosis Date Noted  . Multiple sclerosis ([place]) 04/09/2016    Priority: High  . Cervical myelopathy ([place]) 04/09/2016    Priority: High  . Essential hypertension 04/09/2016    Priority: Medium  . Migraine 03/29/2016    Priority: Medium  . High risk medication use 04/09/2016    Priority: Low  . Allergic rhinitis due to pollen 02/06/2010    Priority: Low  . Numbness 04/09/2016  . Urinary frequency 04/09/2016  . Other fatigue 04/09/2016   Past Surgical History  Procedure Laterality Date  . Cesarean section  06/29/2012    Procedure: CESAREAN SECTION;  Surgeon: Allena Katz, MD;  Location: [facility];  Service: Obstetrics;  Laterality: N/A;    Family History  Problem Relation Age of Onset  . Lung cancer Maternal Grandfather   . Hypertension Mother   . Hypertension Father   . Mental illness Maternal Grandmother   . Glaucoma Paternal Grandmother   . Hypertension Paternal Grandfather   . Diabetes Maternal Grandmother     Medications- reviewed and updated Current Outpatient Prescriptions  Medication Sig Dispense Refill  . fluticasone (FLONASE) 50 MCG/ACT nasal spray Place 1 spray into both nostrils daily as needed for allergies or rhinitis.    . Levonorgestrel (SKYLA) 13.5 MG IUD by Intrauterine route. Replace every 3 years. Due to replace 08/2016    . triamterene-hydrochlorothiazide (MAXZIDE-25) 37.5-25 MG tablet Take 1 tablet by mouth daily. 30 tablet 0   Allergies-reviewed and  updated No Known Allergies  Social History   Social History  . Marital Status: Single    Spouse Name: N/A  . Number of Children: N/A  . Years of Education: N/A   Social History Main Topics  . Smoking status: Never Smoker   . Smokeless tobacco: Never Used  . Alcohol Use: 0.0 - 0.6 oz/week    0-1 Standard drinks or equivalent per week     Comment: occ  . Drug Use: No  . Sexual Activity: Yes    Birth Control/ Protection: IUD   Other Topics Concern  . None   Social History Narrative   Lives at home with son and son's father.  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   Caffeine none.   ROS--Full ROS was completed Review of Systems  Constitutional: Negative for fever and chills.  HENT: Negative for hearing loss and tinnitus.   Eyes: Negative for blurred vision and double vision.  Respiratory: Negative for cough, hemoptysis and shortness of breath.   Cardiovascular: Negative for chest pain and palpitations.  Gastrointestinal: Negative for heartburn and nausea.  Genitourinary: Positive for frequency (since being on hctzz). Negative for dysuria.  Musculoskeletal: Negative for back pain and joint pain.  Skin: Negative for itching and rash.  Neurological: Positive for tingling (right arm due to MS), sensory change (R arm due to MS) and headaches (migraines less frequent on triamterere-hctz).  Endo/Heme/Allergies: Negative for polydipsia. Does not bruise/bleed easily.  Psychiatric/Behavioral: Negative for substance abuse. The patient is not nervous/anxious.  Objective: BP 118/82 mmHg  Pulse 99  Temp(Src) 98.2 F (36.8 C) (Oral)  Ht 5' 3.5" (1.613 m)  Wt 181 lb (82.101 kg)  BMI 31.56 kg/m2  SpO2 98%  LMP 04/08/2016 (Exact Date) Gen: NAD, resting comfortably HEENT: Mucous membranes are moist. Oropharynx normal. TM normal. Eyes: sclera and lids normal, PERRLA Neck: no thyromegaly, no cervical lymphadenopathy CV: RRR no murmurs rubs or gallops Lungs:  CTAB no crackles, wheeze, rhonchi Abdomen: soft/nontender/nondistended/normal bowel sounds. No rebound or guarding.  Ext: no edema Skin: warm, dry Neuro: 5/5 strength in upper and lower extremities, normal gait, normal reflexes  Assessment/Plan:  Essential hypertension S: previously on verapamil but in hospital BP ran higher and was changed to triamterene hctz 37.5-25mg  due to higher BPS and encouraged PCP follow up A/P: Blood pressure looks great today. Gave 9 months of rx. Lives in [place] and encouraged her to get established locally though I was willing to help in her transition time obviously. BP was high at neurologist earlier today but on 2 separate checks <120/85. She has home cuff and given advisement for monitoring per avs.      Multiple sclerosis ([place]) Recent hospitalization for MS- some residual right sided tingling in hand- improving. Saw Dr. Billey Chang and follow up through his office planned.   Return precautions advised.   Meds ordered this encounter  Medications  . Levonorgestrel (SKYLA) 13.5 MG IUD    Sig: by Intrauterine route. Replace every 3 years. Due to replace 08/2016  . triamterene-hydrochlorothiazide (MAXZIDE-25) 37.5-25 MG tablet    Sig: Take 1 tablet by mouth daily.    Dispense:  90 tablet    Refill:  2    Garret Reddish, MD

## 2016-04-09 NOTE — Progress Notes (Signed)
[facility]  PATIENT: [name] DOB: [birth year]  REFERRING DOCTOR OR PCP:  Garret Reddish SOURCE: Patient, notes from [facility], lab and other results from [facility], MRI images personally reviewed on PACS  _________________________________   HISTORICAL  CHIEF COMPLAINT:  Chief Complaint  Patient presents with  . NP ER Referral    R UE weakness, numbness/tingling, positive MRI lesions for MS  [facility] 03-27-2016  . Multiple Sclerosis    treated with solumedrol.     HISTORY OF PRESENT ILLNESS:  I had the pleasure seeing you patient, [name], at [facility] for neurologic consultation regarding her right arm weakness and numbness and abnormal MRI consistent with multiple sclerosis.    About 2 weeks ago she had the onset of right arm numbness and weakness that developed over one day.       She went to the emergency room and an MRI of the brain on 03/28/2016 showed multiple T2/FLAIR hyperintense foci consistent with multiple sclerosis. She was admitted and received 5 days of IV steroids. While in the hospital she had a contrasted MRI of the brain that showed that 2 of those lesions enhanced consistent with more acute onset. Additionally, she had 2 lesions in the cervical spine, both enhancing. The one at C6 was towards the right and could explain her symptoms. She also had a lumbar puncture. The cerebrospinal fluid shows that there were 9 oligoclonal bands in the CSF which is also consistent with multiple sclerosis.  Of note, in 2011, she had headaches and tremors and had an MRI of the brain. It showed a single T2/FLAIR hyperintense focus in the right periventricular white matter of the parietal lobe.   Follow-up MRI in 2012 did not show any new lesions. The symptoms improved and she had no other symptoms until earlier this month.  Strength/sensation/gait: Currently, she feels the strength has returned to normal in the right arm and she does  not note weakness elsewhere. She is still experiencing mild paresthesias in the right hand that are much better than they were 2 weeks ago. She is not experiencing sensory symptoms elsewhere. She notes no problems at all with her gait or balance.  Vision: She does not note any difficulty with visual acuity. There is no diplopia. There is no eye pain. She has not had symptoms consistent with optic neuritis in the past. She does wear glasses.  Bladder/bowel: She has noted some urinary frequency over the past 2 weeks as well as nocturia. Of note, she is taking her medication that includes a diuretic at night. She denies any bowel difficulty.  Fatigue/sleep: She does note much more fatigue over the last couple weeks. The fatigue worsens as the day goes on. She feels the fatigue is both physical and cognitive. She has had some sleep maintenance insomnia over the last couple weeks as well. She has no difficulty with sleep onset insomnia.  Mood/cognition: She denies any depression or anxiety. She has not noted any significant changes with cognitive function.  I personally reviewed the MRI of the brain and cervical spine from 03/28/2016 and 03/29/2016. The MRI of the brain shows multiple T2/FLAIR hyperintense foci, mini radially oriented to the ventricles in the periventricular white matter. 2 foci in the temporal parietal lobe and the right frontal lobe enhance after contrast administration. Of note, there was an MRI of the brain from 11/03/2011 showing a single right periventricular focus. The MRI of the cervical spine shows 2 foci, one adjacent to C3-C4,  posteriorly and another posterior to the right adjacent to C6. Both of these foci enhanced after contrast administration.  REVIEW OF SYSTEMS: Constitutional: No fevers, chills, sweats, or change in appetite.  She has fatigue and some insomnia Eyes: No visual changes, double vision, eye pain Ear, nose and throat: No hearing loss, ear pain, nasal congestion,  sore throat Cardiovascular: No chest pain, palpitations Respiratory: No shortness of breath at rest or with exertion.   No wheezes GastrointestinaI: No nausea, vomiting, diarrhea, abdominal pain, fecal incontinence Genitourinary: No dysuria, urinary retention.  She has frequency and nocturia. Musculoskeletal: No neck pain, back pain Integumentary: No rash, pruritus, skin lesions Neurological: as above Psychiatric: No depression at this time.  No anxiety Endocrine: No palpitations, diaphoresis, change in appetite, change in weigh or increased thirst Hematologic/Lymphatic: No anemia, purpura, petechiae. Allergic/Immunologic: No itchy/runny eyes, nasal congestion, recent allergic reactions, rashes  ALLERGIES: No Known Allergies  HOME MEDICATIONS:  Current outpatient prescriptions:  .  fluticasone (FLONASE) 50 MCG/ACT nasal spray, Place 1 spray into both nostrils daily as needed for allergies or rhinitis., Disp: , Rfl:  .  triamterene-hydrochlorothiazide (MAXZIDE-25) 37.5-25 MG tablet, Take 1 tablet by mouth daily., Disp: 30 tablet, Rfl: 0 .  [DISCONTINUED] methyldopa (ALDOMET) 250 MG tablet, Take 1 tablet (250 mg total) by mouth 2 (two) times daily., Disp: 60 tablet, Rfl: 0  PAST MEDICAL HISTORY: Past Medical History  Diagnosis Date  . HEADACHE, CLUSTER, CHRONIC 02/06/2010  . Allergic rhinitis due to pollen 02/06/2010  . Esophageal reflux 11/29/2008  . Hypertension   . Multiple sclerosis ([place])     PAST SURGICAL HISTORY: Past Surgical History  Procedure Laterality Date  . No past surgeries    . Cesarean section  06/29/2012    Procedure: CESAREAN SECTION;  Surgeon: Allena Katz, MD;  Location: [facility];  Service: Obstetrics;  Laterality: N/A;    FAMILY HISTORY: Family History  Problem Relation Age of Onset  . Lung cancer Maternal Grandfather     SOCIAL HISTORY:  Social History   Social History  . Marital Status: Single    Spouse Name: N/A  . Number of Children:  N/A  . Years of Education: N/A   Occupational History  . Not on file.   Social History Main Topics  . Smoking status: Never Smoker   . Smokeless tobacco: Not on file  . Alcohol Use: Yes     Comment: occ  . Drug Use: No  . Sexual Activity: Yes   Other Topics Concern  . Not on file   Social History Narrative   Live home alone with son.  Full-time Customer service manager.  Wears glasses.   Caffeine none.     PHYSICAL EXAM  Filed Vitals:   04/09/16 0943  BP: 135/93  Pulse: 86  Height: 5\' 5"  (1.651 m)  Weight: 181 lb (82.101 kg)    Body mass index is 30.12 kg/(m^2).   General: The patient is well-developed and well-nourished and in no acute distress  Eyes:  Funduscopic exam shows normal optic discs and retinal vessels.  Neck: The neck is supple, no carotid bruits are noted.  The neck is nontender.  Cardiovascular: The heart has a regular rate and rhythm with a normal S1 and S2. There were no murmurs, gallops or rubs. Lungs are clear to auscultation.  Skin: Extremities are without rash or edema.  Musculoskeletal:  Back is nontender  Neurologic Exam  Mental status: The patient is alert and oriented x 3 at the  time of the examination. The patient has apparent normal recent and remote memory, with an apparently normal attention span and concentration ability.   Speech is normal.  Cranial nerves: Extraocular movements are full. Pupils are equal, round, and reactive to light and accomodation.  Visual fields are full.  Color vision is symmetric. Facial symmetry is present. There is good facial sensation to soft touch bilaterally.Facial strength is normal.  Trapezius and sternocleidomastoid strength is normal. No dysarthria is noted.  The tongue is midline, and the patient has symmetric elevation of the soft palate. No obvious hearing deficits are noted.  Motor:  Muscle bulk is normal.   Tone is normal. Strength is  5 / 5 in all 4 extremities.   Sensory: Sensory testing is intact  to pinprick, soft touch and vibration sensation in all 4 extremities.  Coordination: Cerebellar testing reveals good finger-nose-finger and heel-to-shin bilaterally.  Gait and station: Station is normal.   Gait is normal. Tandem gait is normal. Romberg is negative.   Reflexes: Deep tendon reflexes are symmetric and normal bilaterally.   Plantar responses are flexor.    DIAGNOSTIC DATA (LABS, IMAGING, TESTING) - I reviewed patient records, labs, notes, testing and imaging myself where available.  Lab Results  Component Value Date   WBC 12.1* 03/31/2016   HGB 12.0 03/31/2016   HCT 34.4* 03/31/2016   MCV 83.9 03/31/2016   PLT 276 03/31/2016      Component Value Date/Time   NA 140 03/31/2016 0626   K 3.6 03/31/2016 0626   CL 104 03/31/2016 0626   CO2 23 03/31/2016 0626   GLUCOSE 158* 03/31/2016 0626   BUN 12 03/31/2016 0626   CREATININE 0.84 03/31/2016 0626   CALCIUM 9.0 03/31/2016 0626   PROT 6.5 03/31/2016 0626   ALBUMIN 3.2* 03/31/2016 0626   AST 27 03/31/2016 0626   ALT 40 03/31/2016 0626   ALKPHOS 42 03/31/2016 0626   BILITOT 0.3 03/31/2016 0626   GFRNONAA >60 03/31/2016 0626   GFRAA >60 03/31/2016 0626   No results found for: CHOL, HDL, LDLCALC, LDLDIRECT, TRIG, CHOLHDL No results found for: HGBA1C No results found for: VITAMINB12 Lab Results  Component Value Date   TSH 0.70 11/29/2008       ASSESSMENT AND PLAN  Multiple sclerosis ([place]) - Plan: Stratify JCV Antibody Test ([facility]), Quantiferon tb gold assay (blood), Hepatitis B surface antibody, Hepatitis B core antibody, total, Hepatitis B surface antigen, CBC with Differential/Platelet  High risk medication use - Plan: Stratify JCV Antibody Test ([facility]), Quantiferon tb gold assay (blood), Hepatitis B surface antibody, Hepatitis B core antibody, total, Hepatitis B surface antigen, CBC with Differential/Platelet  Numbness  Cervical myelopathy (HCC)  Urinary frequency  Other fatigue   In summary,  [name] is a 30 year old woman who presented with right arm numbness and weakness and an MRI and spinal fluid consistent with multiple sclerosis. She has a relapsing remitting MS. I am concerned that she presented with 2 enhancing lesions in her spinal cord at C2 enhancing lesions in the brain. The focus at C6 most likely explains her symptoms. Because she is presenting aggressively with 2 new lesions in the spinal cord, I feel she would best benefit from a more efficacious medicine from the outset and we discussed Tysabri and ocrelizumab as two options.  We will check a JCV antibody titer. If she is negative, I would recommend that she begin Tysabri and if she is positive on recommend that she initiate ocrelizumab.    We  will also check for chronic infections including tuberculosis and hepatitis B to help in the treatment decision. We also spent some time discussing the oral and injectable options as well as some drug studies.  She has recovered for the most part since her exacerbation 2 weeks ago.  However, she still has some mild paresthesias in the right hand. She also reports fatigue, urinary frequency and insomnia since her diagnosis. Her fatigue may improve on treatment but if it persists we will consider treatment with modafinil or a stimulant. I have asked her to move her blood pressure medication to the morning as it contains a diuretic and that may also help her sleep. If insomnia persists, consider gabapentin or other agent to help consolidate and improve her sleep.  She will return for her infusion or call sooner if she has new or worsening neurologic symptoms. We'll do a regularly scheduled visit in 2-3 months.  Thank you for asking me to see Mrs. Kaleta. Please let me know if I can be of further assistance with her or other patients in the future.   Zoe Goonan A. Felecia Shelling, MD, PhD [id number], [id number] AM Certified in Neurology, Clinical Neurophysiology, Sleep Medicine, Pain Medicine and  Neuroimaging  [facility] [address], [place], [place] [address] [phone]

## 2016-04-10 LAB — CBC WITH DIFFERENTIAL/PLATELET
BASOS: 0 %
Basophils Absolute: 0 10*3/uL (ref 0.0–0.2)
EOS (ABSOLUTE): 0.1 10*3/uL (ref 0.0–0.4)
EOS: 1 %
HEMATOCRIT: 49 % — AB (ref 34.0–46.6)
HEMOGLOBIN: 16.4 g/dL — AB (ref 11.1–15.9)
Immature Grans (Abs): 0 10*3/uL (ref 0.0–0.1)
Immature Granulocytes: 0 %
LYMPHS ABS: 2.4 10*3/uL (ref 0.7–3.1)
Lymphs: 39 %
MCH: 29.1 pg (ref 26.6–33.0)
MCHC: 33.5 g/dL (ref 31.5–35.7)
MCV: 87 fL (ref 79–97)
MONOCYTES: 8 %
Monocytes Absolute: 0.5 10*3/uL (ref 0.1–0.9)
NEUTROS ABS: 3.2 10*3/uL (ref 1.4–7.0)
Neutrophils: 52 %
Platelets: 233 10*3/uL (ref 150–379)
RBC: 5.63 x10E6/uL — ABNORMAL HIGH (ref 3.77–5.28)
RDW: 15 % (ref 12.3–15.4)
WBC: 6.2 10*3/uL (ref 3.4–10.8)

## 2016-04-10 LAB — HEPATITIS B CORE ANTIBODY, TOTAL: Hep B Core Total Ab: NEGATIVE

## 2016-04-10 LAB — HEPATITIS B SURFACE ANTIGEN: Hepatitis B Surface Ag: NEGATIVE

## 2016-04-10 LAB — HEPATITIS B SURFACE ANTIBODY,QUALITATIVE: Hep B Surface Ab, Qual: REACTIVE

## 2016-04-13 DIAGNOSIS — Z0289 Encounter for other administrative examinations: Secondary | ICD-10-CM

## 2016-04-14 ENCOUNTER — Telehealth: Payer: Self-pay | Admitting: Neurology

## 2016-04-14 DIAGNOSIS — R9089 Other abnormal findings on diagnostic imaging of central nervous system: Secondary | ICD-10-CM

## 2016-04-14 LAB — QUANTIFERON IN TUBE
QFT TB AG MINUS NIL VALUE: 0.05 IU/mL
QUANTIFERON MITOGEN VALUE: >10 IU/mL
QUANTIFERON NIL VALUE: 0.07 [IU]/mL
QUANTIFERON TB AG VALUE: 0.12 IU/mL
QUANTIFERON TB GOLD: NEGATIVE

## 2016-04-14 LAB — QUANTIFERON TB GOLD ASSAY (BLOOD)

## 2016-04-14 NOTE — Telephone Encounter (Signed)
Dr. Yong Channel forwarded additional results from her lumbar puncture. She has elevated Lyme IgM and IgG. I will check a serum Western blot and have her see infectious disease to further evaluate.  I left a message with Satanya and we'll try to call her again tomorrow.

## 2016-04-15 NOTE — Telephone Encounter (Signed)
An attempt was made to call [name] again. I got her voicemail and left a message. We will try again tomorrow.

## 2016-04-16 ENCOUNTER — Telehealth: Payer: Self-pay | Admitting: Neurology

## 2016-04-16 DIAGNOSIS — R9089 Other abnormal findings on diagnostic imaging of central nervous system: Secondary | ICD-10-CM | POA: Insufficient documentation

## 2016-04-16 MED ORDER — TRAZODONE HCL 100 MG PO TABS: ORAL_TABLET | ORAL | Status: DC

## 2016-04-16 NOTE — Telephone Encounter (Signed)
Pt's mother called said she faxed disability papers last Friday 04/09/16 and wanted to know how long it will take to get them back. Operator relayed it could be 14 days. She sts she was not aware of that. Please call her with status

## 2016-04-16 NOTE — Telephone Encounter (Signed)
I have spoken with [name] and advised paperwork may take up to 2 weeks.  I will complete it as soon as possible.   She verbalized understanding of same/fim

## 2016-04-16 NOTE — Telephone Encounter (Signed)
I discussed the Lyme antibodies seen in her CSF. He has not had a tic bite and has not had symptoms commonly seen with Lyme or with Neuro-Lyme.    We will check a serum Western blot.  If that test is positive I would want her to see ID.  She [place] on the [place] of [place]. I will put in the order to [facility] so that she can get tested closer to home.  She also has insomnia and I called in trazodone  Lyme Ab / Western Blot reflex ordered   Faith:   Please see where the [facility] sites in [place] are and give her a phone call back on where to go  [employer]

## 2016-04-16 NOTE — Telephone Encounter (Signed)
Patient is returning a call. She can be reached at [phone].

## 2016-04-19 NOTE — Telephone Encounter (Signed)
[facility].  She is going to have labs drawn at [facility] in [place].  There are 3 [facility] sites in [place] and I need to know which one is most convenient for her so I can fax orders.  There is one on [address],  one on [address], and one on [address]fim

## 2016-04-20 ENCOUNTER — Telehealth: Payer: Self-pay | Admitting: *Deleted

## 2016-04-20 ENCOUNTER — Telehealth: Payer: Self-pay | Admitting: Neurology

## 2016-04-20 ENCOUNTER — Encounter: Payer: Self-pay | Admitting: *Deleted

## 2016-04-20 NOTE — Telephone Encounter (Signed)
FMLA paperwork for pt's mother, [name], is ready.  I have spoken with her and she would like paperwork faxed; I have sent it to med records to be faxed/fim

## 2016-04-20 NOTE — Telephone Encounter (Signed)
I have spoken with [name] this morning.   Lab order faxed to [facility] on [address] in [place] (fax # [phone]) per her request/fim

## 2016-04-20 NOTE — Telephone Encounter (Signed)
Pt returned call to nurse about scheduling lab work. Please call pt back

## 2016-04-22 ENCOUNTER — Telehealth: Payer: Self-pay | Admitting: *Deleted

## 2016-04-22 NOTE — Telephone Encounter (Signed)
Pt form was faxed, on 04/20/16 to [employer].

## 2016-04-22 NOTE — Telephone Encounter (Signed)
Pt FMLA form on Faith desk.

## 2016-05-03 NOTE — Telephone Encounter (Signed)
FMLA paperwork completed and faxed back to [facility] fax # [phone].  I have spoken with mom [name] and let her know this has been done.  Per her request, I have also mailed pt. a copy to home address on file/fim

## 2016-05-04 ENCOUNTER — Encounter: Payer: Self-pay | Admitting: *Deleted

## 2016-06-11 ENCOUNTER — Ambulatory Visit: Payer: 59 | Admitting: Neurology

## 2016-06-18 ENCOUNTER — Ambulatory Visit (INDEPENDENT_AMBULATORY_CARE_PROVIDER_SITE_OTHER): Payer: 59 | Admitting: Neurology

## 2016-06-18 ENCOUNTER — Encounter: Payer: Self-pay | Admitting: Neurology

## 2016-06-18 VITALS — BP 135/75 | HR 68 | Ht 63.5 in | Wt 186.0 lb

## 2016-06-18 DIAGNOSIS — G959 Disease of spinal cord, unspecified: Secondary | ICD-10-CM | POA: Diagnosis not present

## 2016-06-18 DIAGNOSIS — G35 Multiple sclerosis: Secondary | ICD-10-CM

## 2016-06-18 DIAGNOSIS — R5383 Other fatigue: Secondary | ICD-10-CM | POA: Diagnosis not present

## 2016-06-18 DIAGNOSIS — R350 Frequency of micturition: Secondary | ICD-10-CM

## 2016-06-18 DIAGNOSIS — Z79899 Other long term (current) drug therapy: Secondary | ICD-10-CM | POA: Diagnosis not present

## 2016-06-18 DIAGNOSIS — R200 Anesthesia of skin: Secondary | ICD-10-CM | POA: Diagnosis not present

## 2016-06-18 NOTE — Progress Notes (Signed)
[facility]  PATIENT: [name] DOB: 03/11/1986  REFERRING DOCTOR OR PCP:  Garret Reddish SOURCE: Patient, notes from [facility], lab and other results from [facility], MRI images personally reviewed on PACS  _________________________________   HISTORICAL  CHIEF COMPLAINT:  Chief Complaint  Patient presents with  . Follow-up  . Multiple Sclerosis    doing ok, no vision issues, has some L foot swelling at night.      HISTORY OF PRESENT ILLNESS:  [name] is a 30 yo woman with MS.     She started Tysabri after her last visit and has had 2 infusionsa.    She tolerates them well.   She notes a swollen sensation which is new in the left foot, mostly at night. This is mild.  Her right arm symptoms have improved practically to baseline.       Strength/sensation/gait: She notes no problems her gait or balance.  She feels the strength has returned to normal in the right arm and she does not note weakness elsewhere. She denies arm numbness/tingling now. She is not experiencing sensory symptoms elsewhere.   Vision: She does not note any difficulty with visual acuity now or in the past. There is no diplopia. There is no eye pain.  . She does wear glasses.  Bladder/bowel: She has some urinary frequency.  Nocturia resolved when she moved her BP med to the morning.   She denies any bowel difficulty.  Fatigue/sleep: She does note more fatigue since May. The fatigue worsens as the day goes on. She feels the fatigue is both physical and cognitive. She is sleeping well most nights.   She does not need trazodone now  Mood/cognition: She denies any depression or anxiety. She has not noted any significant changes with cognitive function.  MS History:   In May, 2017,  she had the onset of right arm numbness and weakness that developed over one day.       She went to the emergency room and an MRI of the brain on 03/28/2016 showed multiple T2/FLAIR hyperintense foci  consistent with multiple sclerosis. She was admitted and received 5 days of IV steroids. While in the hospital she had a contrasted MRI of the brain that showed that 2 of those lesions enhanced consistent with more acute onset. Additionally, she had 2 lesions in the cervical spine, both enhancing. The one at C6 was towards the right and could explain her symptoms. Another one was adjacent to C3-C4, posteriorly. She also had a lumbar puncture. The cerebrospinal fluid shows that there were 9 oligoclonal bands in the CSF which is also consistent with multiple sclerosis. Due to the aggressiveness of her MS, she was started on Tysabri. She is JCV antibody negative (0.25).   In retrospect, in 2011, she had headaches and tremors and had an MRI of the brain. It showed a single T2/FLAIR hyperintense focus in the right periventricular white matter of the parietal lobe.   Follow-up MRI in 2012 did not show any new lesions. The symptoms improved and she had no other symptoms until 2017  REVIEW OF SYSTEMS: Constitutional: No fevers, chills, sweats, or change in appetite.  She has fatigue and some insomnia Eyes: No visual changes, double vision, eye pain Ear, nose and throat: No hearing loss, ear pain, nasal congestion, sore throat Cardiovascular: No chest pain, palpitations Respiratory: No shortness of breath at rest or with exertion.   No wheezes GastrointestinaI: No nausea, vomiting, diarrhea, abdominal pain, fecal incontinence Genitourinary:  No dysuria, urinary retention.  She has frequency and nocturia. Musculoskeletal: No neck pain, back pain Integumentary: No rash, pruritus, skin lesions Neurological: as above Psychiatric: No depression at this time.  No anxiety Endocrine: No palpitations, diaphoresis, change in appetite, change in weigh or increased thirst Hematologic/Lymphatic: No anemia, purpura, petechiae. Allergic/Immunologic: No itchy/runny eyes, nasal congestion, recent allergic reactions,  rashes  ALLERGIES: No Known Allergies  HOME MEDICATIONS:  Current Outpatient Prescriptions:  .  fluticasone (FLONASE) 50 MCG/ACT nasal spray, Place 1 spray into both nostrils daily as needed for allergies or rhinitis., Disp: , Rfl:  .  Levonorgestrel (SKYLA) 13.5 MG IUD, by Intrauterine route. Replace every 3 years. Due to replace 08/2016, Disp: , Rfl:  .  natalizumab (TYSABRI) 300 MG/15ML injection, Inject 300 mg into the vein every 30 (thirty) days., Disp: , Rfl:  .  traZODone (DESYREL) 100 MG tablet, Take 1/2 to 1 pill po 30 minutes before bedtime, Disp: 30 tablet, Rfl: 11 .  triamterene-hydrochlorothiazide (MAXZIDE-25) 37.5-25 MG tablet, Take 1 tablet by mouth daily., Disp: 90 tablet, Rfl: 2  PAST MEDICAL HISTORY: Past Medical History:  Diagnosis Date  . Allergic rhinitis due to pollen 02/06/2010  . Esophageal reflux 11/29/2008  . HEADACHE, CLUSTER, CHRONIC 02/06/2010   patient reports migraines, not cluster headaches from problem list  . Hypertension   . Multiple sclerosis ([place])     PAST SURGICAL HISTORY: Past Surgical History:  Procedure Laterality Date  . CESAREAN SECTION  06/29/2012   Procedure: CESAREAN SECTION;  Surgeon: Allena Katz, MD;  Location: [facility];  Service: Obstetrics;  Laterality: N/A;    FAMILY HISTORY: Family History  Problem Relation Age of Onset  . Lung cancer Maternal Grandfather   . Hypertension Mother   . Hypertension Father   . Mental illness Maternal Grandmother   . Diabetes Maternal Grandmother   . Glaucoma Paternal Grandmother   . Hypertension Paternal Grandfather     SOCIAL HISTORY:  Social History   Social History  . Marital status: Single    Spouse name: N/A  . Number of children: N/A  . Years of education: N/A   Occupational History  . Not on file.   Social History Main Topics  . Smoking status: Never Smoker  . Smokeless tobacco: Never Used  . Alcohol use 0.0 - 0.6 oz/week     Comment: occ  . Drug use: No  . Sexual  activity: Yes    Birth control/ protection: IUD   Other Topics Concern  . Not on file   Social History Narrative   Lives at home with son and son's father.  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   Caffeine none.     PHYSICAL EXAM  Vitals:   06/18/16 1034  BP: 135/75  Pulse: 68  Weight: 186 lb (84.4 kg)  Height: 5' 3.5" (1.613 m)    Body mass index is 32.43 kg/m.   General: The patient is well-developed and well-nourished and in no acute distress   Skin: Extremities are without rash or edema.  Neurologic Exam  Mental status: The patient is alert and oriented x 3 at the time of the examination. The patient has apparent normal recent and remote memory, with an apparently normal attention span and concentration ability.   Speech is normal.  Cranial nerves: Extraocular movements are full. Pupils are equal, round, and reactive to light and accomodation.   Facial symmetry is present. There is good facial sensation to soft touch bilaterally.Facial  strength is normal.  Trapezius and sternocleidomastoid strength is normal. No dysarthria is noted.  The tongue is midline, and the patient has symmetric elevation of the soft palate. No obvious hearing deficits are noted.  Motor:  Muscle bulk is normal.   Tone is normal. Strength is  5 / 5 in all 4 extremities.   Sensory: Sensory testing is intact to  soft touch and vibration sensation in all 4 extremities.  Coordination: Cerebellar testing reveals good finger-nose-finger and heel-to-shin bilaterally.  Gait and station: Station is normal.   Gait is normal. Tandem gait is normal. Romberg is negative.   Reflexes: Deep tendon reflexes are symmetric and normal bilaterally.        DIAGNOSTIC DATA (LABS, IMAGING, TESTING) - I reviewed patient records, labs, notes, testing and imaging myself where available.  Lab Results  Component Value Date   WBC 6.2 04/09/2016   HGB 12.0 03/31/2016   HCT 49.0 (H) 04/09/2016     MCV 87 04/09/2016   PLT 233 04/09/2016      Component Value Date/Time   NA 140 03/31/2016 0626   K 3.6 03/31/2016 0626   CL 104 03/31/2016 0626   CO2 23 03/31/2016 0626   GLUCOSE 158 (H) 03/31/2016 0626   BUN 12 03/31/2016 0626   CREATININE 0.84 03/31/2016 0626   CALCIUM 9.0 03/31/2016 0626   PROT 6.5 03/31/2016 0626   ALBUMIN 3,800 03/31/2016 1024   AST 27 03/31/2016 0626   ALT 40 03/31/2016 0626   ALKPHOS 42 03/31/2016 0626   BILITOT 0.3 03/31/2016 0626   GFRNONAA >60 03/31/2016 0626   GFRAA >60 03/31/2016 0626    Lab Results  Component Value Date   TSH 0.70 11/29/2008       ASSESSMENT AND PLAN  Multiple sclerosis (HCC)  Cervical myelopathy (HCC)  Numbness  Urinary frequency  Other fatigue  High risk medication use   1.   She will continue Tysabri. We will recheck an other JCV antibody titer in about 4 months. If she converts from negative to middle or high positive, I would recommend a switch to another medication. 2.   She will return to see me in 4 months or call sooner if she has new or worsening neurologic symptoms.  Richard A. Felecia Shelling, MD, PhD [id number], [id number] AM Certified in Neurology, Clinical Neurophysiology, Sleep Medicine, Pain Medicine and Neuroimaging  [facility] [address] [place], [place] [address] [phone]

## 2016-10-18 ENCOUNTER — Ambulatory Visit (INDEPENDENT_AMBULATORY_CARE_PROVIDER_SITE_OTHER): Payer: 59 | Admitting: Neurology

## 2016-10-18 ENCOUNTER — Encounter: Payer: Self-pay | Admitting: Neurology

## 2016-10-18 VITALS — BP 125/86 | HR 75 | Ht 63.0 in | Wt 200.0 lb

## 2016-10-18 DIAGNOSIS — R5383 Other fatigue: Secondary | ICD-10-CM | POA: Diagnosis not present

## 2016-10-18 DIAGNOSIS — G959 Disease of spinal cord, unspecified: Secondary | ICD-10-CM | POA: Diagnosis not present

## 2016-10-18 DIAGNOSIS — G35D Multiple sclerosis, unspecified: Secondary | ICD-10-CM

## 2016-10-18 DIAGNOSIS — Z79899 Other long term (current) drug therapy: Secondary | ICD-10-CM | POA: Diagnosis not present

## 2016-10-18 DIAGNOSIS — R350 Frequency of micturition: Secondary | ICD-10-CM | POA: Diagnosis not present

## 2016-10-18 DIAGNOSIS — G35 Multiple sclerosis: Secondary | ICD-10-CM

## 2016-10-18 MED ORDER — PHENTERMINE HCL 37.5 MG PO CAPS
37.5000 mg | ORAL_CAPSULE | ORAL | 5 refills | Status: DC
Start: 2016-10-18 — End: 2017-04-18

## 2016-10-18 MED ORDER — OXYBUTYNIN CHLORIDE ER 10 MG PO TB24: 10.0000 mg | ORAL_TABLET | Freq: Every day | ORAL | 11 refills | Status: DC

## 2016-10-18 NOTE — Progress Notes (Signed)
[facility]  PATIENT: [name] DOB: 08-12-1986  REFERRING DOCTOR OR PCP:  Garret Reddish SOURCE: Patient, notes from [facility], lab and other results from [facility], MRI images personally reviewed on PACS  _________________________________   HISTORICAL  CHIEF COMPLAINT:  Chief Complaint  Patient presents with  . Multiple Sclerosis    She is currently on Tysabri.  Reports the following symptoms since last seen: frequent urination, unexpected weight gain and intermittent, achy, muscle pains.    HISTORY OF PRESENT ILLNESS:  [name] is a 30 yo woman with MS.     She started Tysabri June 2017 and is tolerating it very well.  She denies any exacerbations and old symptoms have mostly resolved.    Strength/sensation/gait: She feels gait or balance is doing well.  Strength has returned to normal in the right arm.    She denies arm numbness/tingling now. She is not experiencing sensory symptoms elsewhere.   Vision: She does not note any difficulty with visual acuity now or in the past. There is no diplopia. There is no eye pain.  . She does wear glasses.  Bladder/bowel: She has more urinary frequency with some urgency and 2 x noctuira.  She takes Maxide in the evening as frequent urination at work was a problem when she took it in the morning.     Fatigue/sleep: She wakes up refreshed but notes more fatigue worsens as the day goes on. She feels the fatigue is both physical and cognitive. She is sleeping well most nights.   She does not need trazodone now  Mood/cognition: She denies any depression or anxiety. She has not noted any significant changes with cognitive function.   MS History:   In May, 2017,  she had the onset of right arm numbness and weakness that developed over one day.       She went to the emergency room and an MRI of the brain on 03/28/2016 showed multiple T2/FLAIR hyperintense foci consistent with multiple sclerosis. She was admitted and  received 5 days of IV steroids. While in the hospital she had a contrasted MRI of the brain that showed that 2 of those lesions enhanced consistent with more acute onset. Additionally, she had 2 lesions in the cervical spine, both enhancing. The one at C6 was towards the right and could explain her symptoms. Another one was adjacent to C3-C4, posteriorly. She also had a lumbar puncture. The cerebrospinal fluid shows that there were 9 oligoclonal bands in the CSF which is also consistent with multiple sclerosis. Due to the aggressiveness of her MS, she was started on Tysabri. She is JCV antibody negative (0.25).   In retrospect, in 2011, she had headaches and tremors and had an MRI of the brain. It showed a single T2/FLAIR hyperintense focus in the right periventricular white matter of the parietal lobe.   Follow-up MRI in 2012 did not show any new lesions. The symptoms improved and she had no other symptoms until 2017  REVIEW OF SYSTEMS: Constitutional: No fevers, chills, sweats, or change in appetite.  She has fatigue and some insomnia Eyes: No visual changes, double vision, eye pain Ear, nose and throat: No hearing loss, ear pain, nasal congestion, sore throat Cardiovascular: No chest pain, palpitations Respiratory: No shortness of breath at rest or with exertion.   No wheezes GastrointestinaI: No nausea, vomiting, diarrhea, abdominal pain, fecal incontinence Genitourinary: No dysuria, urinary retention.  She has frequency and nocturia. Musculoskeletal: No neck pain, back pain Integumentary: No  rash, pruritus, skin lesions Neurological: as above Psychiatric: No depression at this time.  No anxiety Endocrine: No palpitations, diaphoresis, change in appetite, change in weigh or increased thirst Hematologic/Lymphatic: No anemia, purpura, petechiae. Allergic/Immunologic: No itchy/runny eyes, nasal congestion, recent allergic reactions, rashes  ALLERGIES: No Known Allergies  HOME  MEDICATIONS:  Current Outpatient Prescriptions:  .  fluticasone (FLONASE) 50 MCG/ACT nasal spray, Place 1 spray into both nostrils daily as needed for allergies or rhinitis., Disp: , Rfl:  .  Levonorgestrel (SKYLA) 13.5 MG IUD, by Intrauterine route. Replace every 3 years. Due to replace 08/2016, Disp: , Rfl:  .  natalizumab (TYSABRI) 300 MG/15ML injection, Inject 300 mg into the vein every 30 (thirty) days., Disp: , Rfl:  .  traZODone (DESYREL) 100 MG tablet, Take 1/2 to 1 pill po 30 minutes before bedtime, Disp: 30 tablet, Rfl: 11 .  triamterene-hydrochlorothiazide (MAXZIDE-25) 37.5-25 MG tablet, Take 1 tablet by mouth daily., Disp: 90 tablet, Rfl: 2 .  oxybutynin (DITROPAN XL) 10 MG 24 hr tablet, Take 1 tablet (10 mg total) by mouth at bedtime., Disp: 30 tablet, Rfl: 11 .  phentermine 37.5 MG capsule, Take 1 capsule (37.5 mg total) by mouth every morning., Disp: 30 capsule, Rfl: 5  PAST MEDICAL HISTORY: Past Medical History:  Diagnosis Date  . Allergic rhinitis due to pollen 02/06/2010  . Esophageal reflux 11/29/2008  . HEADACHE, CLUSTER, CHRONIC 02/06/2010   patient reports migraines, not cluster headaches from problem list  . Hypertension   . Multiple sclerosis ([place])     PAST SURGICAL HISTORY: Past Surgical History:  Procedure Laterality Date  . CESAREAN SECTION  06/29/2012   Procedure: CESAREAN SECTION;  Surgeon: Allena Katz, MD;  Location: [facility];  Service: Obstetrics;  Laterality: N/A;    FAMILY HISTORY: Family History  Problem Relation Age of Onset  . Lung cancer Maternal Grandfather   . Hypertension Mother   . Hypertension Father   . Mental illness Maternal Grandmother   . Diabetes Maternal Grandmother   . Glaucoma Paternal Grandmother   . Hypertension Paternal Grandfather     SOCIAL HISTORY:  Social History   Social History  . Marital status: Single    Spouse name: N/A  . Number of children: N/A  . Years of education: N/A   Occupational History  . Not  on file.   Social History Main Topics  . Smoking status: Never Smoker  . Smokeless tobacco: Never Used  . Alcohol use 0.0 - 0.6 oz/week     Comment: occ  . Drug use: No  . Sexual activity: Yes    Birth control/ protection: IUD   Other Topics Concern  . Not on file   Social History Narrative   Lives at home with son and son's father.  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   Caffeine none.     PHYSICAL EXAM  Vitals:   10/18/16 0911  BP: 125/86  Pulse: 75  Weight: 200 lb (90.7 kg)  Height: 5\' 3"  (1.6 m)    Body mass index is 35.43 kg/m.   General: The patient is well-developed and well-nourished and in no acute distress   Skin: Extremities are without rash or edema.  Neurologic Exam  Mental status: The patient is alert and oriented x 3 at the time of the examination. The patient has apparent normal recent and remote memory, with an apparently normal attention span and concentration ability.   Speech is normal.  Cranial nerves: Extraocular  movements are full. Pupils are equal, round, and reactive to light and accomodation.   Facial symmetry is present. There is good facial sensation to soft touch bilaterally.Facial strength is normal.  Trapezius and sternocleidomastoid strength is normal. No dysarthria is noted.  The tongue is midline, and the patient has symmetric elevation of the soft palate. No obvious hearing deficits are noted.  Motor:  Muscle bulk is normal.   Tone is normal. Strength is  5 / 5 in all 4 extremities.   Sensory: Sensory testing is intact to  soft touch and vibration sensation in all 4 extremities.  Coordination: Cerebellar testing reveals good finger-nose-finger and heel-to-shin bilaterally.  Gait and station: Station is normal.   Gait is normal. Tandem gait is normal. Romberg is negative.   Reflexes: Deep tendon reflexes are symmetric and normal bilaterally.        DIAGNOSTIC DATA (LABS, IMAGING, TESTING) - I reviewed  patient records, labs, notes, testing and imaging myself where available.  Lab Results  Component Value Date   WBC 6.2 04/09/2016   HGB 12.0 03/31/2016   HCT 49.0 (H) 04/09/2016   MCV 87 04/09/2016   PLT 233 04/09/2016      Component Value Date/Time   NA 140 03/31/2016 0626   K 3.6 03/31/2016 0626   CL 104 03/31/2016 0626   CO2 23 03/31/2016 0626   GLUCOSE 158 (H) 03/31/2016 0626   BUN 12 03/31/2016 0626   CREATININE 0.84 03/31/2016 0626   CALCIUM 9.0 03/31/2016 0626   PROT 6.5 03/31/2016 0626   ALBUMIN 3,800 03/31/2016 1024   AST 27 03/31/2016 0626   ALT 40 03/31/2016 0626   ALKPHOS 42 03/31/2016 0626   BILITOT 0.3 03/31/2016 0626   GFRNONAA >60 03/31/2016 0626   GFRAA >60 03/31/2016 0626    Lab Results  Component Value Date   TSH 0.70 11/29/2008       ASSESSMENT AND PLAN  Multiple sclerosis ([place]) - Plan: Stratify JCV Antibody Test ([facility]), CBC with Differential/Platelet  Cervical myelopathy (HCC)  High risk medication use  Urinary frequency  Other fatigue   1.   She will continue Tysabri. We will recheck a JCV antibody titer . If she converts from negative to middle or high positive, I would recommend a switch to another medication. 2.  Degenerative plan XL for her bladder and phentermine for fatigue and weight gain 3.   She will return to see me in 4 - 7months or call sooner if she has new or worsening neurologic symptoms.  Richard A. Felecia Shelling, MD, PhD [id number], [id number] PM Certified in Neurology, Clinical Neurophysiology, Sleep Medicine, Pain Medicine and Neuroimaging  [facility] [address], [place], [place] [address] [phone]

## 2016-10-19 LAB — CBC WITH DIFFERENTIAL/PLATELET
BASOS: 0 %
Basophils Absolute: 0 10*3/uL (ref 0.0–0.2)
EOS (ABSOLUTE): 0.4 10*3/uL (ref 0.0–0.4)
EOS: 4 %
HEMATOCRIT: 34.5 % (ref 34.0–46.6)
Hemoglobin: 11.8 g/dL (ref 11.1–15.9)
IMMATURE GRANS (ABS): 0 10*3/uL (ref 0.0–0.1)
IMMATURE GRANULOCYTES: 0 %
LYMPHS: 57 %
Lymphocytes Absolute: 5.5 10*3/uL — ABNORMAL HIGH (ref 0.7–3.1)
MCH: 29.2 pg (ref 26.6–33.0)
MCHC: 34.2 g/dL (ref 31.5–35.7)
MCV: 85 fL (ref 79–97)
MONOS ABS: 1 10*3/uL — AB (ref 0.1–0.9)
Monocytes: 10 %
NEUTROS PCT: 29 %
Neutrophils Absolute: 2.9 10*3/uL (ref 1.4–7.0)
Platelets: 251 10*3/uL (ref 150–379)
RBC: 4.04 x10E6/uL (ref 3.77–5.28)
RDW: 15.1 % (ref 12.3–15.4)
WBC: 9.7 10*3/uL (ref 3.4–10.8)

## 2016-10-20 LAB — HM PAP SMEAR: HM Pap smear: NEGATIVE

## 2016-10-25 ENCOUNTER — Encounter: Payer: Self-pay | Admitting: *Deleted

## 2016-11-15 HISTORY — PX: IUD REMOVAL: SHX5392

## 2016-12-06 DIAGNOSIS — G35 Multiple sclerosis: Secondary | ICD-10-CM | POA: Diagnosis not present

## 2016-12-09 ENCOUNTER — Telehealth: Payer: Self-pay | Admitting: *Deleted

## 2016-12-09 NOTE — Telephone Encounter (Signed)
Spoke to [employer] (pt's mother) - states the paperwork was provided to our office at the time she signed the release form. I let her know that Faith did not receive it and requested her to fax it to [phone].  I apologized for the inconvenience.

## 2016-12-09 NOTE — Telephone Encounter (Signed)
I have located FMLA paperwork and spoke with pt. to let her know.  I also explained paperwork will be faxed next week when RAS returns to the office/signs it.  She verbalized understanding of same/fim

## 2016-12-09 NOTE — Telephone Encounter (Signed)
Sanders.  I received a request, med. rec. release to completed FMLA paperwork, but did not get the actual FMLA paperwork.  I checked with Ronald Pippins, who took the request, and we believe the paperwork probably became separated from the request.  Can she fax me another copy?  Fax# [phone]

## 2016-12-13 NOTE — Telephone Encounter (Signed)
FMLA paperwork (for pt's mother, Genice Rouge), sent up front to [employer] so that form fee can be collected and forms faxed in/fim

## 2016-12-15 DIAGNOSIS — Z0289 Encounter for other administrative examinations: Secondary | ICD-10-CM

## 2017-01-30 IMAGING — MR MR THORACIC SPINE WO/W CM
16 of 24 series · 32 of 48 positions shown · IV contrast (multihance)
Comparison: None.

CLINICAL DATA: Multiple sclerosis

EXAM:
MRI THORACIC SPINE WITHOUT AND WITH CONTRAST
TECHNIQUE: Multiplanar and multiecho pulse sequences of the thoracic spine were
obtained without and with intravenous contrast.
CONTRAST:  15mL MULTIHANCE GADOBENATE DIMEGLUMINE 529 MG/ML IV SOLN

[Series 2: T2 · sagittal · 3.0mm · 0.43mm/px · 1 of 12 slices shown (1 of 5)]
[im 1/12]
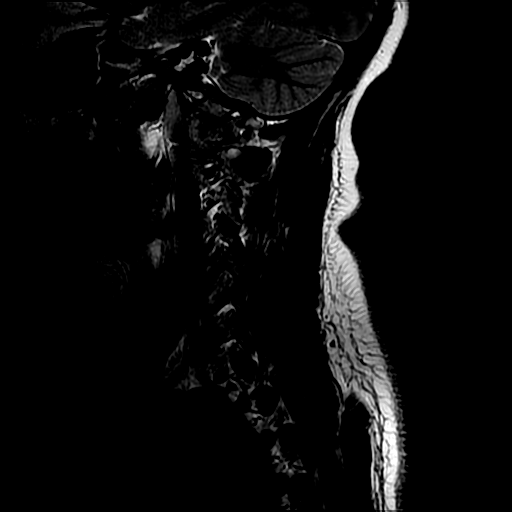

[Series 5: T1 · sagittal · 3.0mm · 0.43mm/px · 1 of 12 slices shown (1 of 5)]
[im 1/12]
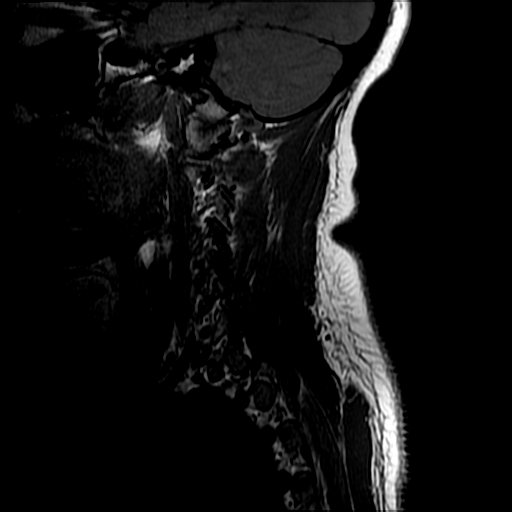

[Series 7: T2 · axial · 3.0mm · 0.39mm/px · 1 of 29 slices shown (2 of 5)]
[im 1/29]
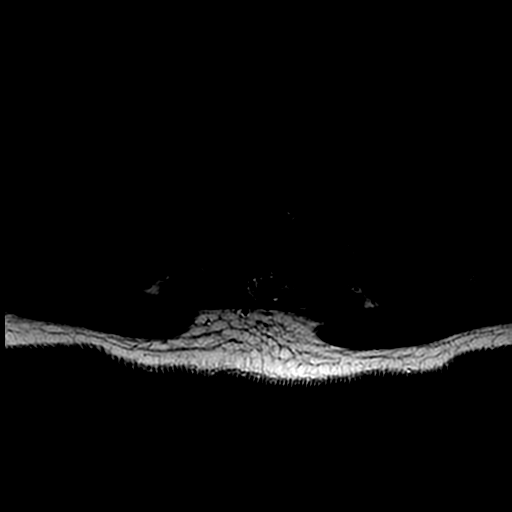

[Series 8: T1 · axial · non-contrast · 3.0mm · 0.78mm/px · z∈[-154,-63]mm · 2 of 29 slices shown (2 of 5)]
[im 1/29]
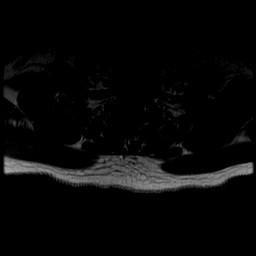
[im 29/29]
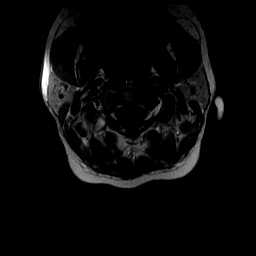

[Series 12: T2 · sagittal · 3.0mm · 0.62mm/px · 1 of 12 slices shown (3 of 5)]
[im 1/12]
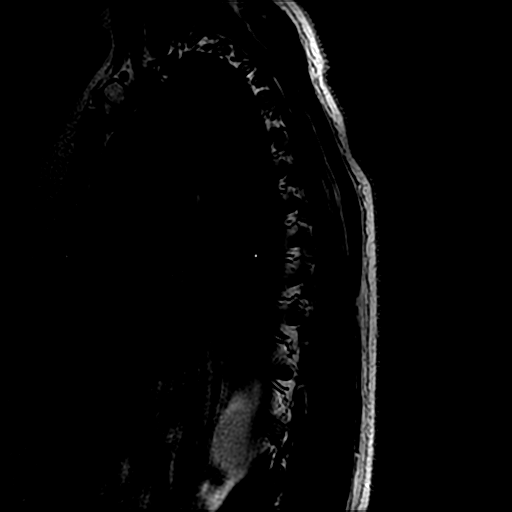

[Series 13: T1 · sagittal · 3.0mm · 0.62mm/px · 1 of 12 slices shown (3 of 5)]
[im 1/12]
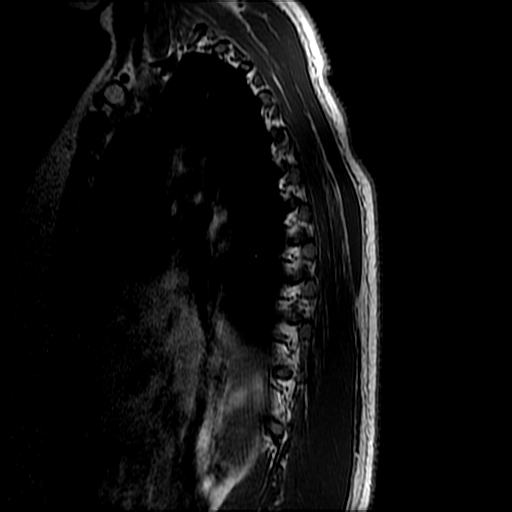

[Series 17: T1 · axial · non-contrast · 4.0mm · 0.86mm/px · z∈[-270,-126]mm · 3 of 30 slices shown (4 of 5)]
[im 1/30]
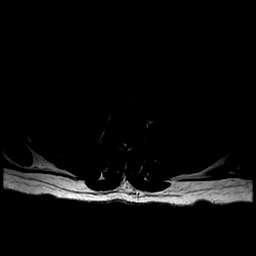
[im 15/30]
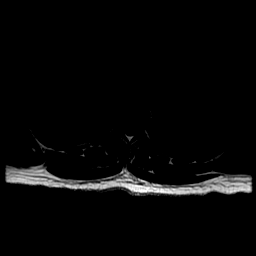
[im 30/30]
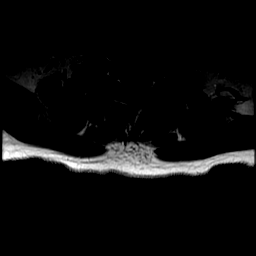

[Series 18: T1 · axial · non-contrast · 4.0mm · 0.86mm/px · z∈[-405,-260]mm · 3 of 30 slices shown (5 of 5)]
[im 1/30]
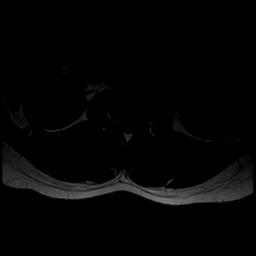
[im 15/30]
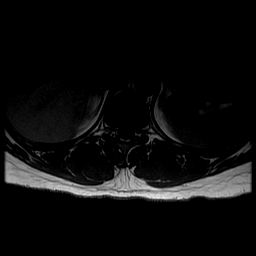
[im 30/30]
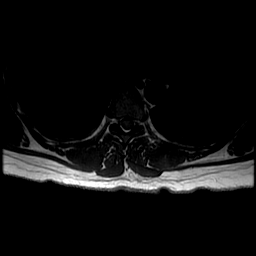

[Series 19: T2 · axial · 4.0mm · 0.43mm/px · z∈[-270,-127]mm · 3 of 30 slices shown (4 of 5)]
[im 1/30]
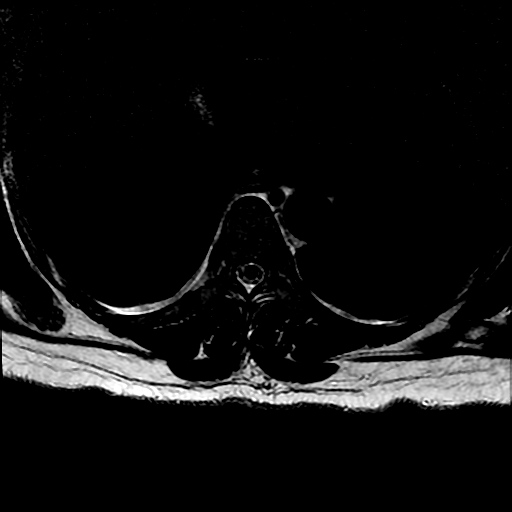
[im 15/30]
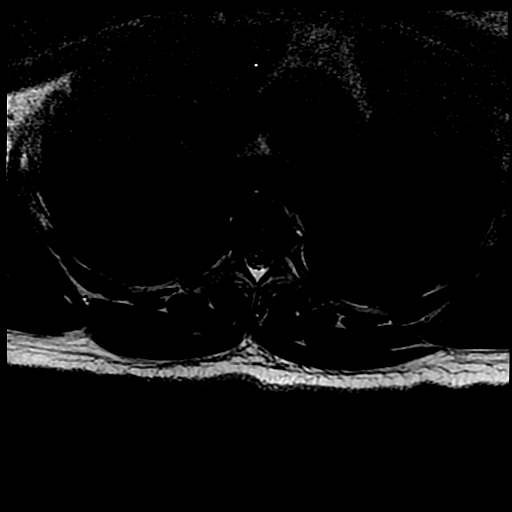
[im 30/30]
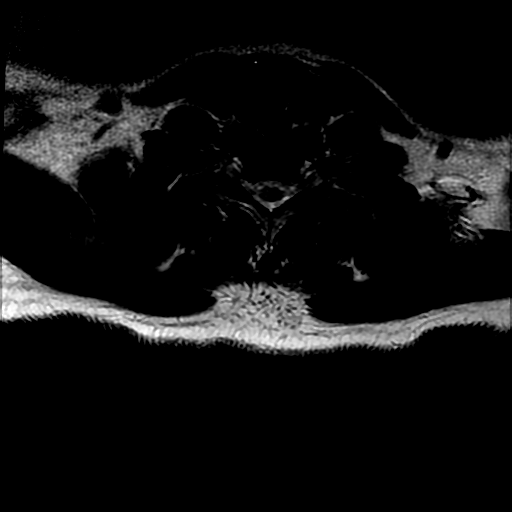

[Series 20: T2 · axial · 4.0mm · 0.43mm/px · z∈[-405,-260]mm · 3 of 30 slices shown (5 of 5)]
[im 1/30]
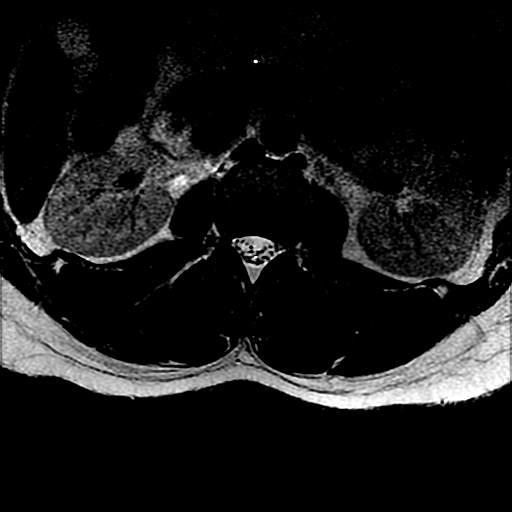
[im 15/30]
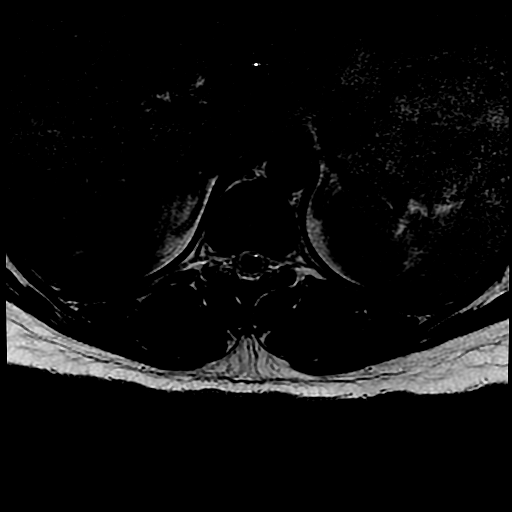
[im 30/30]
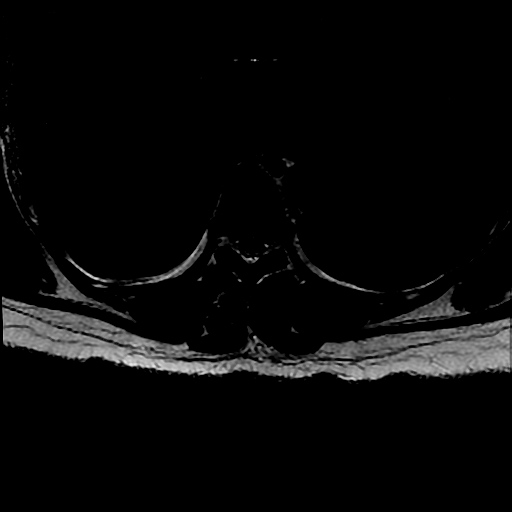

[Series 21: T1 post-contrast · sagittal · 3.0mm · 0.62mm/px · 1 of 12 slices shown (1 of 5)]
[im 1/12]
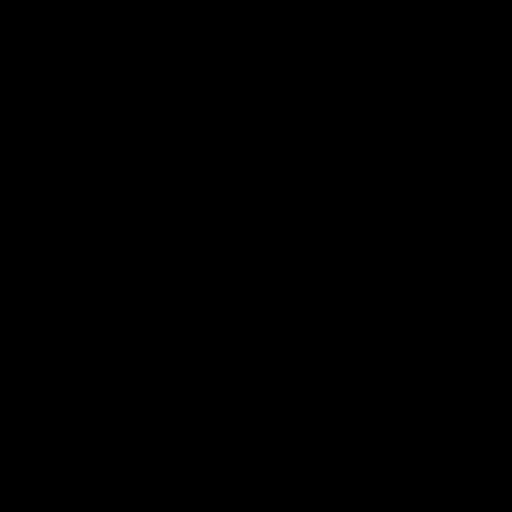

[Series 22: T1 post-contrast · axial · 4.0mm · 0.86mm/px · z∈[-270,-126]mm · 3 of 30 slices shown (2 of 5)]
[im 1/30]
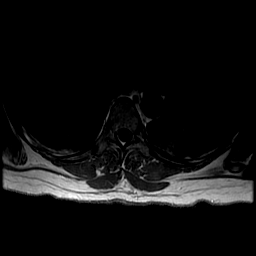
[im 15/30]
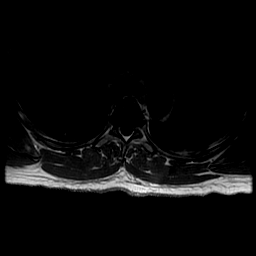
[im 30/30]
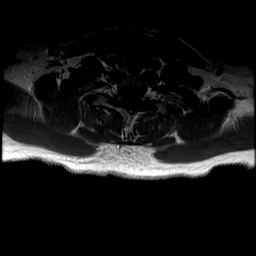

[Series 23: T1 post-contrast · axial · 4.0mm · 0.86mm/px · z∈[-405,-260]mm · 3 of 30 slices shown (3 of 5)]
[im 1/30]
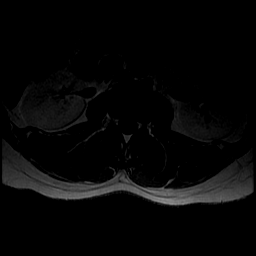
[im 15/30]
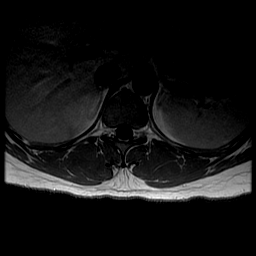
[im 30/30]
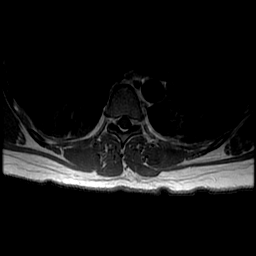

[Series 24: T1 fat-sat post-contrast · sagittal · 3.0mm · 0.86mm/px · 1 of 12 slices shown]
[im 1/12]
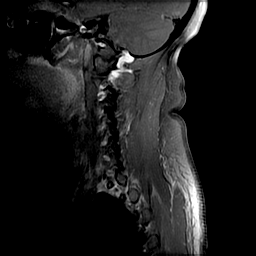

[Series 25: T1 post-contrast · axial · 3.0mm · 0.78mm/px · z∈[-170,-50]mm · 3 of 38 slices shown (4 of 5)]
[im 1/38]
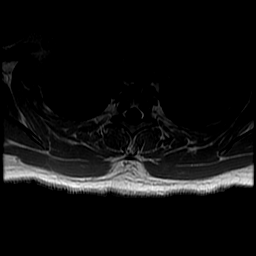
[im 19/38]
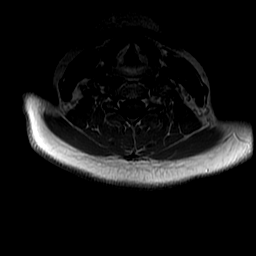
[im 38/38]
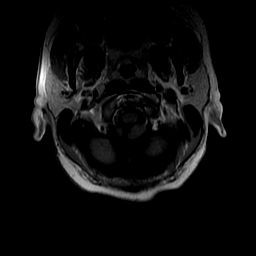

[Series 29: T1 post-contrast · coronal · 5.0mm · 0.43mm/px · 2 of 27 slices shown (5 of 5)]
[im 1/27]
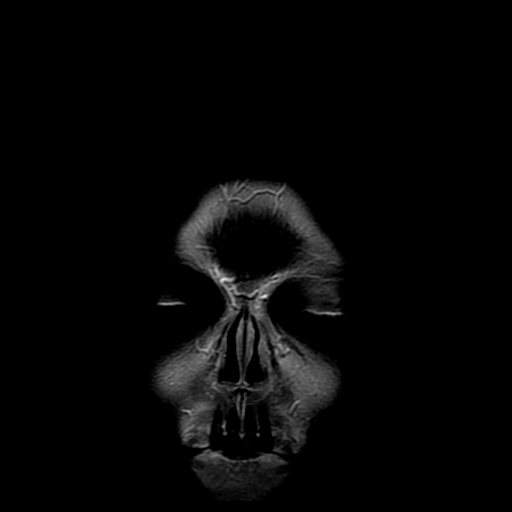
[im 27/27]
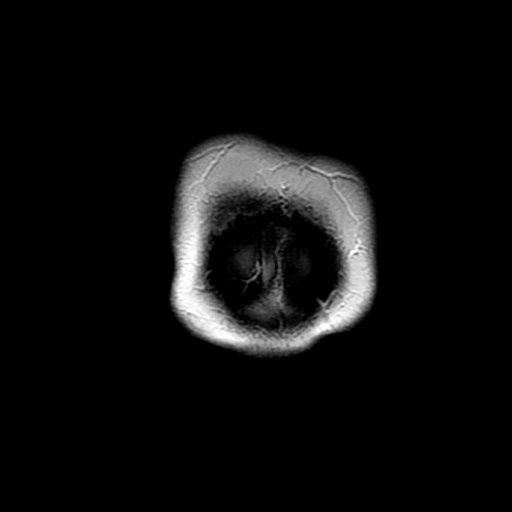

[32 of 48 positions shown; findings below may reference images not displayed]

FINDINGS: Normal thoracic alignment. Negative for fracture or mass. Disc
spaces are normal. No degenerative change. No significant spinal
stenosis. No cord compression

Spinal cord signal is normal. No evidence of demyelinating disease
in the thoracic cord. Normal enhancement postcontrast infusion. No
enhancing lesions identified.
IMPRESSION: Normal

## 2017-02-07 DIAGNOSIS — G35 Multiple sclerosis: Secondary | ICD-10-CM | POA: Diagnosis not present

## 2017-03-07 DIAGNOSIS — G35 Multiple sclerosis: Secondary | ICD-10-CM | POA: Diagnosis not present

## 2017-04-04 DIAGNOSIS — G35 Multiple sclerosis: Secondary | ICD-10-CM | POA: Diagnosis not present

## 2017-04-18 ENCOUNTER — Ambulatory Visit (INDEPENDENT_AMBULATORY_CARE_PROVIDER_SITE_OTHER): Payer: 59 | Admitting: Neurology

## 2017-04-18 ENCOUNTER — Encounter: Payer: Self-pay | Admitting: Neurology

## 2017-04-18 VITALS — BP 135/91 | HR 74 | Resp 16 | Ht 63.0 in | Wt 185.0 lb

## 2017-04-18 DIAGNOSIS — G35 Multiple sclerosis: Secondary | ICD-10-CM

## 2017-04-18 DIAGNOSIS — G35D Multiple sclerosis, unspecified: Secondary | ICD-10-CM

## 2017-04-18 DIAGNOSIS — R5383 Other fatigue: Secondary | ICD-10-CM

## 2017-04-18 DIAGNOSIS — G959 Disease of spinal cord, unspecified: Secondary | ICD-10-CM | POA: Diagnosis not present

## 2017-04-18 DIAGNOSIS — R350 Frequency of micturition: Secondary | ICD-10-CM

## 2017-04-18 DIAGNOSIS — Z79899 Other long term (current) drug therapy: Secondary | ICD-10-CM | POA: Diagnosis not present

## 2017-04-18 DIAGNOSIS — R200 Anesthesia of skin: Secondary | ICD-10-CM | POA: Diagnosis not present

## 2017-04-18 MED ORDER — PHENTERMINE HCL 37.5 MG PO CAPS: 37.5000 mg | ORAL_CAPSULE | ORAL | 5 refills | Status: DC

## 2017-04-18 MED ORDER — OXYBUTYNIN CHLORIDE ER 10 MG PO TB24: 10.0000 mg | ORAL_TABLET | Freq: Every day | ORAL | 11 refills | Status: DC

## 2017-04-18 NOTE — Progress Notes (Signed)
[facility]  PATIENT: [name] DOB: 02-05-1986  REFERRING DOCTOR OR PCP:  Garret Reddish SOURCE: Patient, notes from [facility], lab and other results from [facility], MRI images personally reviewed on PACS  _________________________________   HISTORICAL  CHIEF COMPLAINT:  Chief Complaint  Patient presents with  . Multiple Sclerosis    Sts. she continues to tolerate Tysabri well.  JCV ab last checked 10/18/16 was 0.31.  Has had a little more cramping in legs lately.  Feels regular exercise is helping some (has lost 15 lbs recently) [place]    HISTORY OF PRESENT ILLNESS:  [name] is a 31 yo woman with MS.     She started Tysabri June 2017 and is tolerating it very well.  She denies any exacerbations and old symptoms have mostly resolved.    Strength/sensation/gait: Her gait and balance are doing well.    She denies weakness but she gets cramps in her leg muscles, usually in the afternoons.   She exercises (works out in gym) daily in the mornings.     She denies arm numbness/tingling now.    Vision: She does not note any difficulty with visual acuity now or in the past. There is no diplopia. There is no eye pain.  . She does wear glasses.  Bladder/bowel: Bladder urgency and nocturia is better on Ditropan.   It also helps her sleep since she no longer wakes up for nocturia   Fatigue/sleep: She rarely has fatigue in the mornings but will have some afternoons.  Phentermine helps fatigue.   SHe is now sleeping better.   She does not need trazodone now  Mood/cognition: She denies any depression or anxiety. She has not noted any significant changes with cognitive function.   MS History:   In May, 2017,  she had the onset of right arm numbness and weakness that developed over one day.       She went to the emergency room and an MRI of the brain on 03/28/2016 showed multiple T2/FLAIR hyperintense foci consistent with multiple sclerosis. She was admitted and  received 5 days of IV steroids. While in the hospital she had a contrasted MRI of the brain that showed that 2 of those lesions enhanced consistent with more acute onset. Additionally, she had 2 lesions in the cervical spine, both enhancing. The one at C6 was towards the right and could explain her symptoms. Another one was adjacent to C3-C4, posteriorly. She also had a lumbar puncture. The cerebrospinal fluid shows that there were 9 oligoclonal bands in the CSF which is also consistent with multiple sclerosis. Due to the aggressiveness of her MS, she was started on Tysabri. She is JCV antibody negative (0.25).   In retrospect, in 2011, she had headaches and tremors and had an MRI of the brain. It showed a single T2/FLAIR hyperintense focus in the right periventricular white matter of the parietal lobe.   Follow-up MRI in 2012 did not show any new lesions. The symptoms improved and she had no other symptoms until 2017  REVIEW OF SYSTEMS: Review of Systems  Constitutional: Negative.   HENT: Negative for congestion and hearing loss.   Eyes: Negative.  Negative for blurred vision, double vision, photophobia and pain.  Respiratory: Negative for cough.   Cardiovascular: Negative for chest pain.  Gastrointestinal: Negative for abdominal pain, nausea and vomiting.  Genitourinary: Positive for frequency and urgency. Negative for dysuria.  Musculoskeletal: Negative for back pain and neck pain.  Skin: Negative for itching  and rash.  Neurological: Negative.   Endo/Heme/Allergies: Negative.   Psychiatric/Behavioral: Negative for depression. The patient has insomnia.     ALLERGIES: No Known Allergies  HOME MEDICATIONS:  Current Outpatient Prescriptions:  .  fluticasone (FLONASE) 50 MCG/ACT nasal spray, Place 1 spray into both nostrils daily as needed for allergies or rhinitis., Disp: , Rfl:  .  Levonorgestrel (SKYLA) 13.5 MG IUD, by Intrauterine route. Replace every 3 years. Due to replace 08/2016, Disp:  , Rfl:  .  natalizumab (TYSABRI) 300 MG/15ML injection, Inject 300 mg into the vein every 30 (thirty) days., Disp: , Rfl:  .  oxybutynin (DITROPAN XL) 10 MG 24 hr tablet, Take 1 tablet (10 mg total) by mouth at bedtime., Disp: 30 tablet, Rfl: 11 .  phentermine 37.5 MG capsule, Take 1 capsule (37.5 mg total) by mouth every morning., Disp: 30 capsule, Rfl: 5 .  triamterene-hydrochlorothiazide (MAXZIDE-25) 37.5-25 MG tablet, Take 1 tablet by mouth daily., Disp: 90 tablet, Rfl: 2  PAST MEDICAL HISTORY: Past Medical History:  Diagnosis Date  . Allergic rhinitis due to pollen 02/06/2010  . Esophageal reflux 11/29/2008  . HEADACHE, CLUSTER, CHRONIC 02/06/2010   patient reports migraines, not cluster headaches from problem list  . Hypertension   . Multiple sclerosis ([place])     PAST SURGICAL HISTORY: Past Surgical History:  Procedure Laterality Date  . CESAREAN SECTION  06/29/2012   Procedure: CESAREAN SECTION;  Surgeon: Allena Katz, MD;  Location: [facility];  Service: Obstetrics;  Laterality: N/A;    FAMILY HISTORY: Family History  Problem Relation Age of Onset  . Lung cancer Maternal Grandfather   . Hypertension Mother   . Hypertension Father   . Mental illness Maternal Grandmother   . Diabetes Maternal Grandmother   . Glaucoma Paternal Grandmother   . Hypertension Paternal Grandfather     SOCIAL HISTORY:  Social History   Social History  . Marital status: Single    Spouse name: N/A  . Number of children: N/A  . Years of education: N/A   Occupational History  . Not on file.   Social History Main Topics  . Smoking status: Never Smoker  . Smokeless tobacco: Never Used  . Alcohol use 0.0 - 0.6 oz/week     Comment: occ  . Drug use: No  . Sexual activity: Yes    Birth control/ protection: IUD   Other Topics Concern  . Not on file   Social History Narrative   Lives at home with son and son's father.  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    Caffeine none.     PHYSICAL EXAM  Vitals:   04/18/17 1143  BP: (!) 135/91  Pulse: 74  Resp: 16  Weight: 185 lb (83.9 kg)  Height: 5\' 3"  (1.6 m)    Body mass index is 32.77 kg/m.   General: The patient is well-developed and well-nourished and in no acute distress   Skin: Extremities are without rash or edema..  Neurologic Exam  Mental status: The patient is alert and oriented x 3 at the time of the examination. The patient has apparent normal recent and remote memory, with an apparently normal attention span and concentration ability.   Speech is normal.  Cranial nerves: Extraocular movements are full. Facial strength and sensation is normal. Trapezius and sternocleidomastoid strength is normal. No dysarthria is noted.  The tongue is midline, and the patient has symmetric elevation of the soft palate. No obvious hearing deficits are noted.  Motor:  Muscle bulk is normal.   Tone is normal. Strength is  5 / 5 in all 4 extremities.   Sensory: Sensory testing is intact to touch and vibration sensation in the arms and legs.   Coordination: Cerebellar testing reveals good finger-nose-finger and heel-to-shin bilaterally.  Gait and station: Station and gait are normal. Tandem gait is normal. Romberg is negative.    Reflexes: Deep tendon reflexes are normal and symmetric bilaterally     DIAGNOSTIC DATA (LABS, IMAGING, TESTING) - I reviewed patient records, labs, notes, testing and imaging myself where available.  Lab Results  Component Value Date   WBC 9.7 10/18/2016   HGB 12.0 03/31/2016   HCT 34.5 10/18/2016   MCV 85 10/18/2016   PLT 251 10/18/2016      Component Value Date/Time   NA 140 03/31/2016 0626   K 3.6 03/31/2016 0626   CL 104 03/31/2016 0626   CO2 23 03/31/2016 0626   GLUCOSE 158 (H) 03/31/2016 0626   BUN 12 03/31/2016 0626   CREATININE 0.84 03/31/2016 0626   CALCIUM 9.0 03/31/2016 0626   PROT 6.5 03/31/2016 0626   ALBUMIN 3,800 03/31/2016 1024   AST  27 03/31/2016 0626   ALT 40 03/31/2016 0626   ALKPHOS 42 03/31/2016 0626   BILITOT 0.3 03/31/2016 0626   GFRNONAA >60 03/31/2016 0626   GFRAA >60 03/31/2016 0626    Lab Results  Component Value Date   TSH 0.70 11/29/2008       ASSESSMENT AND PLAN  Multiple sclerosis ([place]) - Plan: Stratify JCV Antibody Test ([facility]), CBC with Differential/Platelet  Cervical myelopathy ([facility])  High risk medication use - Plan: Stratify JCV Antibody Test ([facility]), CBC with Differential/Platelet  Numbness  Other fatigue  Urinary frequency   1.   Continue Tysabri every 4 weeks. We will check a JCV antibody today. . If she converts from negative to middle or high positive, I would recommend a switch to another medication. 2.  She will continue Ditropan XL for the bladder and phentermine for fatigue and weight loss   3.   She will return to see me in 6 monthsmonths or call sooner if she has new or worsening neurologic symptoms.  Shaneequa Bahner A. Felecia Shelling, MD, PhD 0/04/155, 15:37 AM Certified in Neurology, Clinical Neurophysiology, Sleep Medicine, Pain Medicine and Neuroimaging  [facility] [address] [place], [place] [address] [phone]

## 2017-04-19 LAB — CBC WITH DIFFERENTIAL/PLATELET
Basophils Absolute: 0.1 10*3/uL (ref 0.0–0.2)
Basos: 0 %
EOS (ABSOLUTE): 0.3 10*3/uL (ref 0.0–0.4)
Eos: 2 %
Hematocrit: 36.7 % (ref 34.0–46.6)
Hemoglobin: 11.8 g/dL (ref 11.1–15.9)
Immature Grans (Abs): 0 10*3/uL (ref 0.0–0.1)
Immature Granulocytes: 0 %
LYMPHS ABS: 6.8 10*3/uL — AB (ref 0.7–3.1)
LYMPHS: 59 %
MCH: 28 pg (ref 26.6–33.0)
MCHC: 32.2 g/dL (ref 31.5–35.7)
MCV: 87 fL (ref 79–97)
MONOCYTES: 8 %
Monocytes Absolute: 1 10*3/uL — ABNORMAL HIGH (ref 0.1–0.9)
Neutrophils Absolute: 3.7 10*3/uL (ref 1.4–7.0)
Neutrophils: 31 %
Platelets: 374 10*3/uL (ref 150–379)
RBC: 4.21 x10E6/uL (ref 3.77–5.28)
RDW: 15 % (ref 12.3–15.4)
WBC: 11.9 10*3/uL — ABNORMAL HIGH (ref 3.4–10.8)

## 2017-04-26 DIAGNOSIS — J309 Allergic rhinitis, unspecified: Secondary | ICD-10-CM | POA: Diagnosis not present

## 2017-05-05 DIAGNOSIS — G35 Multiple sclerosis: Secondary | ICD-10-CM | POA: Diagnosis not present

## 2017-05-25 ENCOUNTER — Encounter: Payer: Self-pay | Admitting: *Deleted

## 2017-06-06 DIAGNOSIS — G35 Multiple sclerosis: Secondary | ICD-10-CM | POA: Diagnosis not present

## 2017-06-19 DIAGNOSIS — S63691S Other sprain of left index finger, sequela: Secondary | ICD-10-CM | POA: Diagnosis not present

## 2017-06-28 ENCOUNTER — Other Ambulatory Visit: Payer: Self-pay | Admitting: Neurology

## 2017-07-04 DIAGNOSIS — G35 Multiple sclerosis: Secondary | ICD-10-CM | POA: Diagnosis not present

## 2017-07-05 ENCOUNTER — Other Ambulatory Visit: Payer: Self-pay | Admitting: Family Medicine

## 2017-08-02 DIAGNOSIS — G35 Multiple sclerosis: Secondary | ICD-10-CM | POA: Diagnosis not present

## 2017-09-05 DIAGNOSIS — G35 Multiple sclerosis: Secondary | ICD-10-CM | POA: Diagnosis not present

## 2017-09-28 ENCOUNTER — Telehealth: Payer: Self-pay | Admitting: *Deleted

## 2017-09-28 DIAGNOSIS — Z0289 Encounter for other administrative examinations: Secondary | ICD-10-CM

## 2017-09-28 NOTE — Telephone Encounter (Addendum)
Pt [employer] form on Faith desk. Form paid for.

## 2017-09-28 NOTE — Telephone Encounter (Signed)
FMLA Paperwork completed and placed in scan box for Medical Records/fim

## 2017-09-30 ENCOUNTER — Telehealth: Payer: Self-pay | Admitting: *Deleted

## 2017-09-30 NOTE — Telephone Encounter (Signed)
Pt met life form faxed to [phone].

## 2017-10-03 DIAGNOSIS — G35 Multiple sclerosis: Secondary | ICD-10-CM | POA: Diagnosis not present

## 2017-10-12 ENCOUNTER — Other Ambulatory Visit: Payer: Self-pay | Admitting: *Deleted

## 2017-10-12 MED ORDER — TRIAMTERENE-HCTZ 37.5-25 MG PO TABS: 1.0000 | ORAL_TABLET | Freq: Every day | ORAL | 0 refills | Status: DC

## 2017-10-20 DIAGNOSIS — Z01419 Encounter for gynecological examination (general) (routine) without abnormal findings: Secondary | ICD-10-CM | POA: Diagnosis not present

## 2017-10-31 ENCOUNTER — Encounter: Payer: Self-pay | Admitting: *Deleted

## 2017-10-31 DIAGNOSIS — G35 Multiple sclerosis: Secondary | ICD-10-CM | POA: Diagnosis not present

## 2017-11-03 ENCOUNTER — Other Ambulatory Visit: Payer: Self-pay | Admitting: Neurology

## 2017-11-16 DIAGNOSIS — D251 Intramural leiomyoma of uterus: Secondary | ICD-10-CM | POA: Diagnosis not present

## 2017-11-29 DIAGNOSIS — G35 Multiple sclerosis: Secondary | ICD-10-CM | POA: Diagnosis not present

## 2017-12-09 ENCOUNTER — Encounter: Payer: Self-pay | Admitting: Family Medicine

## 2017-12-09 ENCOUNTER — Ambulatory Visit (INDEPENDENT_AMBULATORY_CARE_PROVIDER_SITE_OTHER): Payer: 59 | Admitting: Family Medicine

## 2017-12-09 VITALS — BP 108/88 | HR 94 | Temp 98.6°F | Ht 63.0 in | Wt 200.4 lb

## 2017-12-09 DIAGNOSIS — G35 Multiple sclerosis: Secondary | ICD-10-CM

## 2017-12-09 DIAGNOSIS — I10 Essential (primary) hypertension: Secondary | ICD-10-CM | POA: Diagnosis not present

## 2017-12-09 MED ORDER — TRIAMTERENE-HCTZ 37.5-25 MG PO TABS: 1.0000 | ORAL_TABLET | Freq: Every day | ORAL | 3 refills | Status: DC

## 2017-12-09 NOTE — Patient Instructions (Addendum)
Since you are doing labs with Dr. Felecia Shelling soon- can you ask him to do a CMP test or BMP plus LFTs? If not next time I see you we can order those- looks like last tested 2017  Sign release of information at the check out desk for last pap smear and Tdap with [facility]  Refilled medication - good for a year- would love to see you back next year for a recheck and another refill

## 2017-12-09 NOTE — Assessment & Plan Note (Signed)
Patient doing well on tysabri. She sees Dr. Felecia Shelling next month. I asked her to see if a cmp could be added to labs.

## 2017-12-09 NOTE — Progress Notes (Signed)
Subjective:  [name] is a 32 y.o. year old very pleasant female patient who presents for/with See problem oriented charting ROS- No chest pain or shortness of breath. No headache or blurry vision.  Intermittent mild left shin pain- she plans to discuss with Dr. Felecia Shelling   Past Medical History-  Patient Active Problem List   Diagnosis Date Noted  . Multiple sclerosis ([place]) 04/09/2016    Priority: High  . Cervical myelopathy ([place]) 04/09/2016    Priority: High  . Essential hypertension 04/09/2016    Priority: Medium  . Migraine 03/29/2016    Priority: Medium  . High risk medication use 04/09/2016    Priority: Low  . Allergic rhinitis due to pollen 02/06/2010    Priority: Low  . Abnormal brain MRI 04/16/2016  . Numbness 04/09/2016  . Urinary frequency 04/09/2016  . Other fatigue 04/09/2016    Medications- reviewed and updated Current Outpatient Medications  Medication Sig Dispense Refill  . Etonogestrel (NEXPLANON [place]) Inject 1 application into the skin.    . fluticasone (FLONASE) 50 MCG/ACT nasal spray TWO SPRAYS EACH NOSTRIL ONCE A DAY 1 g 0  . natalizumab (TYSABRI) 300 MG/15ML injection Inject 300 mg into the vein every 30 (thirty) days.    Marland Kitchen oxybutynin (DITROPAN XL) 10 MG 24 hr tablet Take 1 tablet (10 mg total) by mouth at bedtime. 30 tablet 11  . phentermine 37.5 MG capsule TAKE 1 CAPSULE BY MOUTH EVERY MORNING 30 capsule 5  . triamterene-hydrochlorothiazide (MAXZIDE-25) 37.5-25 MG tablet Take 1 tablet by mouth daily. 30 tablet 0   No current facility-administered medications for this visit.     Objective: BP 108/88 (BP Location: Left Arm, Patient Position: Sitting, Cuff Size: Large)   Pulse 94   Temp 98.6 F (37 C) (Oral)   Ht 5\' 3"  (1.6 m)   Wt 200 lb 6.4 oz (90.9 kg)   SpO2 97%   BMI 35.50 kg/m  Gen: NAD, resting comfortably CV: RRR no murmurs rubs or gallops Lungs: CTAB no crackles, wheeze, rhonchi Abdomen: soft/nontender/nondistended/normal bowel  sounds. obese Ext: no edema Skin: warm, dry  Assessment/Plan:  Essential hypertension S: controlled on triamterene hctz 37.5-25mg .   Home cuff- diastolic 81-85, systolic low 631S.  BP Readings from Last 3 Encounters:  12/09/17 108/88  04/18/17 (!) 135/91  10/18/16 125/86  A/P: We discussed blood pressure goal of <140/90. Continue current meds: originally she had planned to live In [place] but she is living here now and son going to [employer] in kindergarten  no family history of high cholesterol- havent checked here yet.  total cholesterol at work 15th of january 2019. With that low value im ok with not repeating lipid panel.   Multiple sclerosis ([place]) Patient doing well on tysabri. She sees Dr. Felecia Shelling next month. I asked her to see if a cmp could be added to labs.    Future Appointments  Date Time Provider [place]  01/03/2018  3:30 PM Sater, Nanine Means, MD [facility] None  12/11/2018  9:00 AM Marin Olp, MD [facility]   Asked her to follow up at least once a year- appears she scheduled before she left  Meds ordered this encounter  Medications  . triamterene-hydrochlorothiazide (MAXZIDE-25) 37.5-25 MG tablet    Sig: Take 1 tablet by mouth daily.    Dispense:  90 tablet    Refill:  3    Return precautions advised.  Garret Reddish, MD

## 2017-12-09 NOTE — Assessment & Plan Note (Addendum)
S: controlled on triamterene hctz 37.5-25mg .   Home cuff- diastolic 32-67, systolic low 124P.  BP Readings from Last 3 Encounters:  12/09/17 108/88  04/18/17 (!) 135/91  10/18/16 125/86  A/P: We discussed blood pressure goal of <140/90. Continue current meds: originally she had planned to live In [place] but she is living here now and son going to [employer] in kindergarten  no family history of high cholesterol- havent checked here yet.  total cholesterol at work 15th of january 2019. With that low value im ok with not repeating lipid panel.

## 2017-12-28 DIAGNOSIS — G35 Multiple sclerosis: Secondary | ICD-10-CM | POA: Diagnosis not present

## 2018-01-03 ENCOUNTER — Ambulatory Visit: Payer: 59 | Admitting: Neurology

## 2018-01-26 DIAGNOSIS — G35 Multiple sclerosis: Secondary | ICD-10-CM | POA: Diagnosis not present

## 2018-02-13 DIAGNOSIS — D259 Leiomyoma of uterus, unspecified: Secondary | ICD-10-CM | POA: Diagnosis not present

## 2018-02-13 DIAGNOSIS — R102 Pelvic and perineal pain: Secondary | ICD-10-CM | POA: Diagnosis not present

## 2018-02-14 NOTE — Patient Instructions (Addendum)
Your procedure is scheduled on: Tuesday February 21, 2018 at 7:30 am  Enter through the Main Entrance of [facility] at: 6:00 am  Pick up the phone at the desk and dial [phone].  Call this number if you have problems the morning of surgery: [phone].  Remember: Do NOT eat food or drink any liquids after: Midnight on Monday April 8  Take these medicines the morning of surgery with a SIP OF WATER: Flonase spray if needed  STOP ALL HERBAL MEDICATIONS AND SUPPLEMENTS 1 WEEK PRIOR TO SURGERY  DO NOT SMOKE DAY OF SURGERY  Do NOT wear jewelry (body piercing), metal hair clips/bobby pins, make-up, or nail polish. Do NOT wear lotions, powders, or perfumes.  You may wear deoderant. Do NOT shave for 48 hours prior to surgery. Do NOT bring valuables to the hospital. Contacts, dentures, or bridgework may not be worn into surgery. Leave suitcase in car.  After surgery it may be brought to your room.    For patients admitted to the hospital, checkout time is 11:00 AM the day of discharge.

## 2018-02-15 ENCOUNTER — Other Ambulatory Visit: Payer: Self-pay

## 2018-02-15 ENCOUNTER — Encounter (HOSPITAL_COMMUNITY): Payer: Self-pay

## 2018-02-15 ENCOUNTER — Encounter (HOSPITAL_COMMUNITY)
Admission: RE | Admit: 2018-02-15 | Discharge: 2018-02-15 | Disposition: A | Discharge status: Home / Self Care | Payer: 59 | Source: Ambulatory Visit | Attending: Obstetrics and Gynecology | Admitting: Obstetrics and Gynecology

## 2018-02-15 DIAGNOSIS — Z0183 Encounter for blood typing: Secondary | ICD-10-CM | POA: Insufficient documentation

## 2018-02-15 DIAGNOSIS — Z01818 Encounter for other preprocedural examination: Secondary | ICD-10-CM | POA: Diagnosis not present

## 2018-02-15 DIAGNOSIS — Z01812 Encounter for preprocedural laboratory examination: Secondary | ICD-10-CM | POA: Diagnosis not present

## 2018-02-15 HISTORY — DX: Myoneural disorder, unspecified: G70.9

## 2018-02-15 LAB — CBC
HEMATOCRIT: 35.2 % — AB (ref 36.0–46.0)
HEMOGLOBIN: 12.3 g/dL (ref 12.0–15.0)
MCH: 28.3 pg (ref 26.0–34.0)
MCHC: 34.9 g/dL (ref 30.0–36.0)
MCV: 81.1 fL (ref 78.0–100.0)
Platelets: 274 10*3/uL (ref 150–400)
RBC: 4.34 MIL/uL (ref 3.87–5.11)
RDW: 15.8 % — ABNORMAL HIGH (ref 11.5–15.5)
WBC: 9.3 10*3/uL (ref 4.0–10.5)

## 2018-02-15 LAB — BASIC METABOLIC PANEL
ANION GAP: 10 (ref 5–15)
BUN: 14 mg/dL (ref 6–20)
CHLORIDE: 101 mmol/L (ref 101–111)
CO2: 23 mmol/L (ref 22–32)
Calcium: 9.5 mg/dL (ref 8.9–10.3)
Creatinine, Ser: 0.86 mg/dL (ref 0.44–1.00)
GFR calc Af Amer: >60 mL/min (ref >60)
Glucose, Bld: 114 mg/dL — ABNORMAL HIGH (ref 65–99)
POTASSIUM: 3.4 mmol/L — AB (ref 3.5–5.1)
SODIUM: 134 mmol/L — AB (ref 135–145)

## 2018-02-15 LAB — TYPE AND SCREEN
ABO/RH(D): O POS
Antibody Screen: NEGATIVE

## 2018-02-17 ENCOUNTER — Encounter: Payer: Self-pay | Admitting: Neurology

## 2018-02-17 ENCOUNTER — Ambulatory Visit: Payer: 59 | Admitting: Neurology

## 2018-02-17 VITALS — BP 140/92 | HR 86 | Ht 64.5 in | Wt 203.5 lb

## 2018-02-17 DIAGNOSIS — R5383 Other fatigue: Secondary | ICD-10-CM | POA: Diagnosis not present

## 2018-02-17 DIAGNOSIS — Z79899 Other long term (current) drug therapy: Secondary | ICD-10-CM

## 2018-02-17 DIAGNOSIS — R200 Anesthesia of skin: Secondary | ICD-10-CM

## 2018-02-17 DIAGNOSIS — R350 Frequency of micturition: Secondary | ICD-10-CM | POA: Diagnosis not present

## 2018-02-17 DIAGNOSIS — G35 Multiple sclerosis: Secondary | ICD-10-CM

## 2018-02-17 MED ORDER — PHENTERMINE HCL 37.5 MG PO CAPS: 37.5000 mg | ORAL_CAPSULE | Freq: Every day | ORAL | 5 refills | Status: DC

## 2018-02-17 MED ORDER — OXYBUTYNIN CHLORIDE ER 10 MG PO TB24: 10.0000 mg | ORAL_TABLET | Freq: Every day | ORAL | 11 refills | Status: DC

## 2018-02-17 NOTE — Progress Notes (Signed)
[facility]  PATIENT: [name] DOB: [birth year]  REFERRING DOCTOR OR PCP:  Garret Reddish SOURCE: Patient, notes from [facility], lab and other results from [facility], MRI images personally reviewed on PACS  _________________________________   HISTORICAL  CHIEF COMPLAINT:  Chief Complaint  Patient presents with  . Multiple Sclerosis    She is still doing well on Tysabri.  Last JCV ab 0.29 indeterminate with inhibition assay negative (resulted on 04/19/17).  Ditropan is helpful for bladder control. She has not taken Phentermine for the last month in preparation for a uterine myomectomy on 02/21/18.  Fatigue has been an issue since being off the medication.  She plans to restart after surgery.    HISTORY OF PRESENT ILLNESS:  [name] is a 32 yo woman with MS.       Update 02/17/2018: Her Tysabri infusions are doing well.   She gets some cramping in the left leg and the right forearm/hand at times.   She is JCV Ab negative last tested 04/2017  She has to walk a long distance from the car to her work and she cramps up more during the walk.    Her balance is ok.    Strength is mostly fine unless she walks longer distances.    She denies constant numbness or tingling.  However, if she sits a long time, her left leg goes numb and she needs to get up to moves.   Her bladder function is much better on ditropan.  Vision is stable.   She wears glasses.  No MS related visual issues.    She has low fatigue in the morning but she struggles to get out of bed.  Fatigue is worse in the afternoon.   She is off phentermine until after gyn surgery.  She gets 5-6 hours sleep most nights.  Mood is fine.   She denies cognitive issues.   She gets some headaches usually in the forehead in the afternoon.   No N/V.   No photo/phonophobia.     From 04/18/2017: She started Tysabri June 2017 and is tolerating it very well.  She denies any exacerbations and old symptoms have mostly resolved.     Strength/sensation/gait: Her gait and balance are doing well.    She denies weakness but she gets cramps in her leg muscles, usually in the afternoons.   She exercises (works out in gym) daily in the mornings.     She denies arm numbness/tingling now.    Vision: She does not note any difficulty with visual acuity now or in the past. There is no diplopia. There is no eye pain.  . She does wear glasses.  Bladder/bowel: Bladder urgency and nocturia is better on Ditropan.   It also helps her sleep since she no longer wakes up for nocturia   Fatigue/sleep: She rarely has fatigue in the mornings but will have some afternoons.  Phentermine helps fatigue.   SHe is now sleeping better.   She does not need trazodone now  Mood/cognition: She denies any depression or anxiety. She has not noted any significant changes with cognitive function.   MS History:   In May, 2017,  she had the onset of right arm numbness and weakness that developed over one day.       She went to the emergency room and an MRI of the brain on 03/28/2016 showed multiple T2/FLAIR hyperintense foci consistent with multiple sclerosis. She was admitted and received 5 days of IV steroids.  While in the hospital she had a contrasted MRI of the brain that showed that 2 of those lesions enhanced consistent with more acute onset. Additionally, she had 2 lesions in the cervical spine, both enhancing. The one at C6 was towards the right and could explain her symptoms. Another one was adjacent to C3-C4, posteriorly. She also had a lumbar puncture. The cerebrospinal fluid shows that there were 9 oligoclonal bands in the CSF which is also consistent with multiple sclerosis. Due to the aggressiveness of her MS, she was started on Tysabri. She is JCV antibody negative (0.25).   In retrospect, in 2011, she had headaches and tremors and had an MRI of the brain. It showed a single T2/FLAIR hyperintense focus in the right periventricular white matter of the  parietal lobe.   Follow-up MRI in 2012 did not show any new lesions. The symptoms improved and she had no other symptoms until 2017  REVIEW OF SYSTEMS: Review of Systems  Constitutional: Positive for malaise/fatigue.  HENT: Negative for congestion and hearing loss.   Eyes: Negative.  Negative for blurred vision, double vision, photophobia and pain.  Respiratory: Negative for cough.   Cardiovascular: Negative for chest pain.  Gastrointestinal: Negative for abdominal pain, nausea and vomiting.  Genitourinary: Positive for frequency and urgency. Negative for dysuria.  Musculoskeletal: Negative for back pain and neck pain.  Skin: Negative for itching and rash.  Neurological: Negative.   Endo/Heme/Allergies: Negative.   Psychiatric/Behavioral: Negative for depression. The patient has insomnia.     ALLERGIES: No Known Allergies  HOME MEDICATIONS:  Current Outpatient Medications:  .  etonogestrel (NEXPLANON) 68 MG IMPL implant, Inject 1 application into the skin once. , Disp: , Rfl:  .  fluticasone (FLONASE) 50 MCG/ACT nasal spray, TWO SPRAYS EACH NOSTRIL ONCE A DAY (Patient taking differently: TWO SPRAYS EACH NOSTRIL ONCE A DAY AS NEEDED), Disp: 1 g, Rfl: 0 .  natalizumab (TYSABRI) 300 MG/15ML injection, Inject 300 mg into the vein every 30 (thirty) days., Disp: , Rfl:  .  oxybutynin (DITROPAN XL) 10 MG 24 hr tablet, Take 1 tablet (10 mg total) by mouth at bedtime., Disp: 30 tablet, Rfl: 11 .  phentermine 37.5 MG capsule, Take 1 capsule (37.5 mg total) by mouth daily., Disp: 30 capsule, Rfl: 5 .  triamterene-hydrochlorothiazide (MAXZIDE-25) 37.5-25 MG tablet, Take 1 tablet by mouth daily., Disp: 90 tablet, Rfl: 3  PAST MEDICAL HISTORY: Past Medical History:  Diagnosis Date  . Allergic rhinitis due to pollen 02/06/2010  . Esophageal reflux 11/29/2008  . HEADACHE, CLUSTER, CHRONIC 02/06/2010   patient reports migraines, not cluster headaches from problem list  . Hypertension   . Multiple  sclerosis ([place])   . Neuromuscular disorder ([place])    multiple sclerosis    PAST SURGICAL HISTORY: Past Surgical History:  Procedure Laterality Date  . CESAREAN SECTION  06/29/2012   Procedure: CESAREAN SECTION;  Surgeon: Allena Katz, MD;  Location: [facility];  Service: Obstetrics;  Laterality: N/A;  . IUD REMOVAL  2018    FAMILY HISTORY: Family History  Problem Relation Age of Onset  . Lung cancer Maternal Grandfather   . Hypertension Mother   . Hypertension Father   . Mental illness Maternal Grandmother   . Diabetes Maternal Grandmother   . Glaucoma Paternal Grandmother   . Hypertension Paternal Grandfather     SOCIAL HISTORY:  Social History   Socioeconomic History  . Marital status: Single    Spouse name: Not on file  . Number  of children: Not on file  . Years of education: Not on file  . Highest education level: Not on file  Occupational History  . Not on file  Social Needs  . Financial resource strain: Not on file  . Food insecurity:    Worry: Not on file    Inability: Not on file  . Transportation needs:    Medical: Not on file    Non-medical: Not on file  Tobacco Use  . Smoking status: Never Smoker  . Smokeless tobacco: Never Used  Substance and Sexual Activity  . Alcohol use: Yes    Alcohol/week: 0.0 - 0.6 oz    Comment: occ  . Drug use: No  . Sexual activity: Yes    Birth control/protection: IUD  Lifestyle  . Physical activity:    Days per week: Not on file    Minutes per session: Not on file  . Stress: Not on file  Relationships  . Social connections:    Talks on phone: Not on file    Gets together: Not on file    Attends religious service: Not on file    Active member of club or organization: Not on file    Attends meetings of clubs or organizations: Not on file    Relationship status: Not on file  . Intimate partner violence:    Fear of current or ex partner: Not on file    Emotionally abused: Not on file    Physically abused: Not  on file    Forced sexual activity: Not on file  Other Topics Concern  . Not on file  Social History Narrative   Lives at home with son (born 2013)  and son's father.       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    Prior  Advertising account executive (Pharmacist, community) Garment/textile technologist.        Wears glasses.   Caffeine none.     PHYSICAL EXAM  Vitals:   02/17/18 0816  BP: (!) 140/92  Pulse: 86  Weight: 203 lb 8 oz (92.3 kg)  Height: 5' 4.5" (1.638 m)    Body mass index is 34.39 kg/m.   General: The patient is well-developed and well-nourished and in no acute distress  Extremities:    No rash or edema.  Neurologic Exam  Mental status: The patient is alert and oriented x 3 at the time of the examination. The patient has apparent normal recent and remote memory, with an apparently normal attention span and concentration ability.   Speech is normal.  Cranial nerves: Extraocular movements are full.  Facial strength and sensation is normal.  Trapezius strength is normal.  The tongue is midline, and the patient has symmetric elevation of the soft palate. No obvious hearing deficits are noted.  Motor:  Muscle bulk is normal.  Muscle tone is normal and strength is 5/5.Marland Kitchen   Sensory: Sensory testing is intact to touch and vibration sensation in the arms and legs.   Coordination: Cerebellar testing reveals good finger-nose-finger and heel-to-shin bilaterally.  Gait and station: The station is normal and the gait is normal.  Tandem gait is slightly wide.   Romberg is negative.    Reflexes: Deep tendon reflexes are normal and symmetric bilaterally     DIAGNOSTIC DATA (LABS, IMAGING, TESTING) - I reviewed patient records, labs, notes, testing and imaging myself where available.  Lab Results  Component Value Date   WBC 9.3 02/15/2018   HGB 12.3  02/15/2018   HCT 35.2 (L) 02/15/2018   MCV 81.1 02/15/2018   PLT 274 02/15/2018      Component Value Date/Time   NA  134 (L) 02/15/2018 0910   K 3.4 (L) 02/15/2018 0910   CL 101 02/15/2018 0910   CO2 23 02/15/2018 0910   GLUCOSE 114 (H) 02/15/2018 0910   BUN 14 02/15/2018 0910   CREATININE 0.86 02/15/2018 0910   CALCIUM 9.5 02/15/2018 0910   PROT 6.5 03/31/2016 0626   ALBUMIN 3,800 03/31/2016 1024   AST 27 03/31/2016 0626   ALT 40 03/31/2016 0626   ALKPHOS 42 03/31/2016 0626   BILITOT 0.3 03/31/2016 0626   GFRNONAA >60 02/15/2018 0910   GFRAA >60 02/15/2018 0910    Lab Results  Component Value Date   TSH 0.70 11/29/2008       ASSESSMENT AND PLAN  Multiple sclerosis ([place]) - Plan: Stratify JCV Antibody Test ([facility]), CBC with Differential/Platelet  High risk medication use  Urinary frequency  Numbness  Other fatigue     1.  Check CBC and JCV antibody.  She will continue Tysabri, if negative or low positive titer, and will consider a switch if the JCV antibody is middle or high positive. 2.  I reviewed the Ditropan for that her bladder frequency and phentermine for her fatigue. 3.   Note for work to be allowed a closer parking space.   4.   She will return to see me in 6 months or call sooner if she has new or worsening neurologic symptoms.  Analys Ryden A. Felecia Shelling, MD, PhD 12/16/3084, 5:78 AM Certified in Neurology, Clinical Neurophysiology, Sleep Medicine, Pain Medicine and Neuroimaging  [facility] [address] [place], [place] [address] [phone]

## 2018-02-18 LAB — CBC WITH DIFFERENTIAL/PLATELET
BASOS: 0 %
Basophils Absolute: 0 10*3/uL (ref 0.0–0.2)
EOS (ABSOLUTE): 0.3 10*3/uL (ref 0.0–0.4)
EOS: 3 %
Hematocrit: 36.8 % (ref 34.0–46.6)
Hemoglobin: 12.3 g/dL (ref 11.1–15.9)
IMMATURE GRANS (ABS): 0 10*3/uL (ref 0.0–0.1)
Immature Granulocytes: 0 %
Lymphocytes Absolute: 5.6 10*3/uL — ABNORMAL HIGH (ref 0.7–3.1)
Lymphs: 61 %
MCH: 27.9 pg (ref 26.6–33.0)
MCHC: 33.4 g/dL (ref 31.5–35.7)
MCV: 83 fL (ref 79–97)
MONOS ABS: 0.7 10*3/uL (ref 0.1–0.9)
Monocytes: 8 %
NEUTROS PCT: 28 %
Neutrophils Absolute: 2.6 10*3/uL (ref 1.4–7.0)
PLATELETS: 297 10*3/uL (ref 150–379)
RBC: 4.41 x10E6/uL (ref 3.77–5.28)
RDW: 16 % — AB (ref 12.3–15.4)
WBC: 9.3 10*3/uL (ref 3.4–10.8)

## 2018-02-20 ENCOUNTER — Encounter (HOSPITAL_COMMUNITY): Payer: Self-pay

## 2018-02-20 DIAGNOSIS — G35 Multiple sclerosis: Secondary | ICD-10-CM | POA: Diagnosis not present

## 2018-02-20 NOTE — Anesthesia Preprocedure Evaluation (Signed)
Anesthesia Evaluation  Patient identified by MRN, date of birth, ID band Patient awake    Reviewed: Allergy & Precautions, H&P , Patient's Chart, lab work & pertinent test results, reviewed documented beta blocker date and time   Airway Mallampati: II  TM Distance: >3 FB Neck ROM: full    Dental no notable dental hx.    Pulmonary    Pulmonary exam normal breath sounds clear to auscultation       Cardiovascular hypertension,  Rhythm:regular Rate:Normal     Neuro/Psych  Neuromuscular disease    GI/Hepatic   Endo/Other    Renal/GU      Musculoskeletal   Abdominal   Peds  Hematology   Anesthesia Other Findings Hypertension   Multiple sclerosis    Reproductive/Obstetrics                             Anesthesia Physical Anesthesia Plan  ASA: II  Anesthesia Plan: General   Post-op Pain Management:    Induction: Intravenous  PONV Risk Score and Plan: 3 and Treatment may vary due to age or medical condition, Dexamethasone, Ondansetron, Scopolamine patch - Pre-op and Midazolam  Airway Management Planned: Oral ETT  Additional Equipment:   Intra-op Plan:   Post-operative Plan: Extubation in OR  Informed Consent: I have reviewed the patients History and Physical, chart, labs and discussed the procedure including the risks, benefits and alternatives for the proposed anesthesia with the patient or authorized representative who has indicated his/her understanding and acceptance.   Dental Advisory Given  Plan Discussed with: CRNA and Surgeon  Anesthesia Plan Comments: (  )        Anesthesia Quick Evaluation

## 2018-02-21 ENCOUNTER — Inpatient Hospital Stay (HOSPITAL_COMMUNITY): Payer: 59 | Admitting: Anesthesiology

## 2018-02-21 ENCOUNTER — Encounter (HOSPITAL_COMMUNITY)
Admission: AD | Disposition: A | Discharge status: Home / Self Care | Payer: Self-pay | Source: Ambulatory Visit | Attending: Obstetrics and Gynecology

## 2018-02-21 ENCOUNTER — Encounter (HOSPITAL_COMMUNITY): Payer: Self-pay

## 2018-02-21 ENCOUNTER — Inpatient Hospital Stay (HOSPITAL_COMMUNITY)
Admission: AD | Admit: 2018-02-21 | Discharge: 2018-02-23 | DRG: 743 | Disposition: A | Discharge status: Home / Self Care | Payer: 59 | Source: Ambulatory Visit | Attending: Obstetrics and Gynecology | Admitting: Obstetrics and Gynecology

## 2018-02-21 ENCOUNTER — Other Ambulatory Visit: Payer: Self-pay

## 2018-02-21 DIAGNOSIS — R102 Pelvic and perineal pain: Secondary | ICD-10-CM | POA: Diagnosis present

## 2018-02-21 DIAGNOSIS — D219 Benign neoplasm of connective and other soft tissue, unspecified: Secondary | ICD-10-CM | POA: Diagnosis present

## 2018-02-21 DIAGNOSIS — G35 Multiple sclerosis: Secondary | ICD-10-CM | POA: Diagnosis not present

## 2018-02-21 DIAGNOSIS — I10 Essential (primary) hypertension: Secondary | ICD-10-CM | POA: Diagnosis present

## 2018-02-21 DIAGNOSIS — D259 Leiomyoma of uterus, unspecified: Principal | ICD-10-CM | POA: Diagnosis present

## 2018-02-21 DIAGNOSIS — J301 Allergic rhinitis due to pollen: Secondary | ICD-10-CM | POA: Diagnosis not present

## 2018-02-21 HISTORY — PX: MYOMECTOMY: SHX85

## 2018-02-21 LAB — PREGNANCY, URINE: PREG TEST UR: NEGATIVE

## 2018-02-21 SURGERY — MYOMECTOMY, ABDOMINAL APPROACH
Anesthesia: General

## 2018-02-21 MED ORDER — FENTANYL CITRATE (PF) 250 MCG/5ML IJ SOLN
INTRAMUSCULAR | Status: AC
  Filled 2018-02-21: qty 5

## 2018-02-21 MED ORDER — PROPOFOL 10 MG/ML IV BOLUS
INTRAVENOUS | Status: DC | PRN
  Administered 2018-02-21: 200 mg via INTRAVENOUS
  Administered 2018-02-21 (×3): 10 mg via INTRAVENOUS

## 2018-02-21 MED ORDER — BUPIVACAINE LIPOSOME 1.3 % IJ SUSP
20.0000 mL | Freq: Once | INTRAMUSCULAR | Status: AC
  Administered 2018-02-21: 20 mL
  Filled 2018-02-21: qty 20

## 2018-02-21 MED ORDER — PROPOFOL 10 MG/ML IV BOLUS
INTRAVENOUS | Status: AC
  Filled 2018-02-21: qty 20

## 2018-02-21 MED ORDER — ONDANSETRON HCL 4 MG/2ML IJ SOLN: 4.0000 mg | Freq: Four times a day (QID) | INTRAMUSCULAR | Status: DC | PRN

## 2018-02-21 MED ORDER — SUGAMMADEX SODIUM 200 MG/2ML IV SOLN
INTRAVENOUS | Status: AC
  Filled 2018-02-21: qty 2

## 2018-02-21 MED ORDER — MENTHOL 3 MG MT LOZG: 1.0000 | LOZENGE | OROMUCOSAL | Status: DC | PRN

## 2018-02-21 MED ORDER — TRIAMTERENE-HCTZ 37.5-25 MG PO TABS
1.0000 | ORAL_TABLET | Freq: Every day | ORAL | Status: DC
  Administered 2018-02-22 – 2018-02-23 (×2): 1 via ORAL
  Filled 2018-02-21 (×3): qty 1

## 2018-02-21 MED ORDER — ONDANSETRON HCL 4 MG PO TABS: 4.0000 mg | ORAL_TABLET | Freq: Four times a day (QID) | ORAL | Status: DC | PRN

## 2018-02-21 MED ORDER — SODIUM CHLORIDE 0.9 % IJ SOLN
INTRAMUSCULAR | Status: DC | PRN
  Administered 2018-02-21: 40 mL

## 2018-02-21 MED ORDER — MIDAZOLAM HCL 2 MG/2ML IJ SOLN
INTRAMUSCULAR | Status: AC
  Filled 2018-02-21: qty 2

## 2018-02-21 MED ORDER — KETOROLAC TROMETHAMINE 30 MG/ML IJ SOLN: 30.0000 mg | Freq: Four times a day (QID) | INTRAMUSCULAR | Status: DC

## 2018-02-21 MED ORDER — SCOPOLAMINE 1 MG/3DAYS TD PT72
MEDICATED_PATCH | TRANSDERMAL | Status: AC
  Administered 2018-02-21: 1.5 mg via TRANSDERMAL
  Filled 2018-02-21: qty 1

## 2018-02-21 MED ORDER — ROCURONIUM BROMIDE 100 MG/10ML IV SOLN
INTRAVENOUS | Status: DC | PRN
  Administered 2018-02-21: 50 mg via INTRAVENOUS

## 2018-02-21 MED ORDER — ONDANSETRON HCL 4 MG/2ML IJ SOLN
INTRAMUSCULAR | Status: DC | PRN
  Administered 2018-02-21: 4 mg via INTRAVENOUS

## 2018-02-21 MED ORDER — FENTANYL CITRATE (PF) 100 MCG/2ML IJ SOLN
INTRAMUSCULAR | Status: AC
  Filled 2018-02-21: qty 2

## 2018-02-21 MED ORDER — CEFOTETAN DISODIUM-DEXTROSE 2-2.08 GM-%(50ML) IV SOLR
INTRAVENOUS | Status: AC
  Filled 2018-02-21: qty 50

## 2018-02-21 MED ORDER — VASOPRESSIN 20 UNIT/ML IV SOLN
INTRAVENOUS | Status: AC
  Filled 2018-02-21: qty 1

## 2018-02-21 MED ORDER — SODIUM CHLORIDE 0.9 % IJ SOLN
INTRAMUSCULAR | Status: AC
  Filled 2018-02-21: qty 20

## 2018-02-21 MED ORDER — ONDANSETRON HCL 4 MG/2ML IJ SOLN
INTRAMUSCULAR | Status: AC
  Filled 2018-02-21: qty 2

## 2018-02-21 MED ORDER — DEXAMETHASONE SODIUM PHOSPHATE 10 MG/ML IJ SOLN
INTRAMUSCULAR | Status: AC
  Filled 2018-02-21: qty 1

## 2018-02-21 MED ORDER — CEFOTETAN DISODIUM-DEXTROSE 2-2.08 GM-%(50ML) IV SOLR
2.0000 g | INTRAVENOUS | Status: AC
  Administered 2018-02-21: 2 g via INTRAVENOUS

## 2018-02-21 MED ORDER — GLYCOPYRROLATE 0.2 MG/ML IJ SOLN
INTRAMUSCULAR | Status: DC | PRN
  Administered 2018-02-21: 0.1 mg via INTRAVENOUS

## 2018-02-21 MED ORDER — SODIUM CHLORIDE 0.9% FLUSH: 9.0000 mL | INTRAVENOUS | Status: DC | PRN

## 2018-02-21 MED ORDER — BUTORPHANOL TARTRATE 1 MG/ML IJ SOLN: 1.0000 mg | INTRAMUSCULAR | Status: DC | PRN

## 2018-02-21 MED ORDER — LIDOCAINE HCL (CARDIAC) 20 MG/ML IV SOLN: INTRAVENOUS | Status: DC | PRN

## 2018-02-21 MED ORDER — ACETAMINOPHEN 160 MG/5ML PO SOLN
ORAL | Status: AC
  Filled 2018-02-21: qty 40.6

## 2018-02-21 MED ORDER — ROCURONIUM BROMIDE 100 MG/10ML IV SOLN
INTRAVENOUS | Status: AC
  Filled 2018-02-21: qty 1

## 2018-02-21 MED ORDER — BUPIVACAINE HCL (PF) 0.25 % IJ SOLN
INTRAMUSCULAR | Status: DC | PRN
  Administered 2018-02-21: 20 mL

## 2018-02-21 MED ORDER — FENTANYL CITRATE (PF) 100 MCG/2ML IJ SOLN
INTRAMUSCULAR | Status: DC | PRN
  Administered 2018-02-21 (×2): 50 ug via INTRAVENOUS
  Administered 2018-02-21 (×2): 25 ug via INTRAVENOUS
  Administered 2018-02-21 (×2): 50 ug via INTRAVENOUS

## 2018-02-21 MED ORDER — SUGAMMADEX SODIUM 200 MG/2ML IV SOLN
INTRAVENOUS | Status: DC | PRN
  Administered 2018-02-21: 200 mg via INTRAVENOUS

## 2018-02-21 MED ORDER — KETOROLAC TROMETHAMINE 30 MG/ML IJ SOLN
30.0000 mg | Freq: Four times a day (QID) | INTRAMUSCULAR | Status: DC
  Administered 2018-02-21 – 2018-02-22 (×3): 30 mg via INTRAVENOUS
  Filled 2018-02-21 (×3): qty 1

## 2018-02-21 MED ORDER — DEXAMETHASONE SODIUM PHOSPHATE 4 MG/ML IJ SOLN
INTRAMUSCULAR | Status: DC | PRN
  Administered 2018-02-21: 8 mg via INTRAVENOUS

## 2018-02-21 MED ORDER — MORPHINE SULFATE 2 MG/ML IV SOLN
INTRAVENOUS | Status: DC
  Administered 2018-02-21: 11:00:00 via INTRAVENOUS
  Administered 2018-02-21: 3 mL via INTRAVENOUS
  Administered 2018-02-21: 3 mg via INTRAVENOUS
  Administered 2018-02-21: 5 mg via INTRAVENOUS
  Administered 2018-02-22 (×2): 2 mg via INTRAVENOUS
  Administered 2018-02-22: 1 mg via INTRAVENOUS

## 2018-02-21 MED ORDER — SODIUM CHLORIDE 0.9 % IJ SOLN
INTRAMUSCULAR | Status: AC
  Filled 2018-02-21: qty 100

## 2018-02-21 MED ORDER — OXYCODONE-ACETAMINOPHEN 5-325 MG PO TABS
1.0000 | ORAL_TABLET | Freq: Four times a day (QID) | ORAL | Status: DC | PRN
  Administered 2018-02-22 – 2018-02-23 (×2): 1 via ORAL
  Filled 2018-02-21 (×2): qty 1

## 2018-02-21 MED ORDER — BUPIVACAINE HCL (PF) 0.25 % IJ SOLN
INTRAMUSCULAR | Status: AC
  Filled 2018-02-21: qty 30

## 2018-02-21 MED ORDER — ACETAMINOPHEN 160 MG/5ML PO SOLN
975.0000 mg | Freq: Once | ORAL | Status: AC
Start: 2018-02-21 — End: 2018-02-21
  Administered 2018-02-21: 975 mg via ORAL

## 2018-02-21 MED ORDER — GLYCOPYRROLATE 0.2 MG/ML IJ SOLN
INTRAMUSCULAR | Status: AC
  Filled 2018-02-21: qty 1

## 2018-02-21 MED ORDER — PHENTERMINE HCL 37.5 MG PO CAPS: 37.5000 mg | ORAL_CAPSULE | Freq: Every day | ORAL | Status: DC

## 2018-02-21 MED ORDER — LIDOCAINE HCL (CARDIAC) 20 MG/ML IV SOLN
INTRAVENOUS | Status: AC
  Filled 2018-02-21: qty 5

## 2018-02-21 MED ORDER — DEXTROSE IN LACTATED RINGERS 5 % IV SOLN
INTRAVENOUS | Status: DC
  Administered 2018-02-21 – 2018-02-22 (×2): via INTRAVENOUS

## 2018-02-21 MED ORDER — KETOROLAC TROMETHAMINE 30 MG/ML IJ SOLN
INTRAMUSCULAR | Status: AC
  Filled 2018-02-21: qty 1

## 2018-02-21 MED ORDER — NALOXONE HCL 0.4 MG/ML IJ SOLN: 0.4000 mg | INTRAMUSCULAR | Status: DC | PRN

## 2018-02-21 MED ORDER — SCOPOLAMINE 1 MG/3DAYS TD PT72
1.0000 | MEDICATED_PATCH | Freq: Once | TRANSDERMAL | Status: DC
  Administered 2018-02-21: 1.5 mg via TRANSDERMAL

## 2018-02-21 MED ORDER — METHYLENE BLUE 0.5 % INJ SOLN
INTRAVENOUS | Status: AC
  Filled 2018-02-21: qty 10

## 2018-02-21 MED ORDER — KETOROLAC TROMETHAMINE 30 MG/ML IJ SOLN
INTRAMUSCULAR | Status: DC | PRN
  Administered 2018-02-21: 30 mg via INTRAVENOUS

## 2018-02-21 MED ORDER — FLUTICASONE PROPIONATE 50 MCG/ACT NA SUSP
1.0000 | Freq: Every day | NASAL | Status: DC
  Administered 2018-02-23: 1 via NASAL
  Filled 2018-02-21: qty 16

## 2018-02-21 MED ORDER — DIPHENHYDRAMINE HCL 12.5 MG/5ML PO ELIX: 12.5000 mg | ORAL_SOLUTION | Freq: Four times a day (QID) | ORAL | Status: DC | PRN

## 2018-02-21 MED ORDER — PROPOFOL 10 MG/ML IV BOLUS
INTRAVENOUS | Status: AC
  Filled 2018-02-21: qty 40

## 2018-02-21 MED ORDER — MIDAZOLAM HCL 2 MG/2ML IJ SOLN
INTRAMUSCULAR | Status: DC | PRN
  Administered 2018-02-21: 2 mg via INTRAVENOUS

## 2018-02-21 MED ORDER — IBUPROFEN 800 MG PO TABS
800.0000 mg | ORAL_TABLET | Freq: Three times a day (TID) | ORAL | Status: DC | PRN
  Administered 2018-02-22 – 2018-02-23 (×3): 800 mg via ORAL
  Filled 2018-02-21 (×3): qty 1

## 2018-02-21 MED ORDER — FENTANYL CITRATE (PF) 100 MCG/2ML IJ SOLN
25.0000 ug | INTRAMUSCULAR | Status: DC | PRN
  Administered 2018-02-21 (×3): 50 ug via INTRAVENOUS

## 2018-02-21 MED ORDER — KETOROLAC TROMETHAMINE 30 MG/ML IJ SOLN
30.0000 mg | Freq: Once | INTRAMUSCULAR | Status: AC
  Filled 2018-02-21: qty 1

## 2018-02-21 MED ORDER — VASOPRESSIN 20 UNIT/ML IV SOLN
INTRAVENOUS | Status: DC | PRN
  Administered 2018-02-21: 10 mL via INTRAMUSCULAR

## 2018-02-21 MED ORDER — LACTATED RINGERS IV SOLN
INTRAVENOUS | Status: DC
  Administered 2018-02-21 (×2): via INTRAVENOUS

## 2018-02-21 MED ORDER — DIPHENHYDRAMINE HCL 50 MG/ML IJ SOLN: 12.5000 mg | Freq: Four times a day (QID) | INTRAMUSCULAR | Status: DC | PRN

## 2018-02-21 SURGICAL SUPPLY — 47 items
APL SKNCLS STERI-STRIP NONHPOA (GAUZE/BANDAGES/DRESSINGS) ×1
BARRIER ADHS 3X4 INTERCEED (GAUZE/BANDAGES/DRESSINGS) ×1 IMPLANT
BENZOIN TINCTURE PRP APPL 2/3 (GAUZE/BANDAGES/DRESSINGS) ×1 IMPLANT
BRR ADH 4X3 ABS CNTRL BYND (GAUZE/BANDAGES/DRESSINGS) ×1
CANISTER SUCT 3000ML PPV (MISCELLANEOUS) ×2 IMPLANT
CATH FOLEY 2WAY  3CC  8FR (CATHETERS)
CATH FOLEY 2WAY 3CC 8FR (CATHETERS) IMPLANT
CONT PATH 16OZ SNAP LID 3702 (MISCELLANEOUS) ×2 IMPLANT
DECANTER SPIKE VIAL GLASS SM (MISCELLANEOUS) ×2 IMPLANT
DRAPE CESAREAN BIRTH W POUCH (DRAPES) ×2 IMPLANT
DRSG TELFA 3X8 NADH (GAUZE/BANDAGES/DRESSINGS) ×4 IMPLANT
FILTER STRAW FLUID ASPIR (MISCELLANEOUS) IMPLANT
GAUZE SPONGE 4X4 12PLY STRL LF (GAUZE/BANDAGES/DRESSINGS) ×4 IMPLANT
GAUZE SPONGE 4X4 16PLY XRAY LF (GAUZE/BANDAGES/DRESSINGS) ×4 IMPLANT
GLOVE BIO SURGEON STRL SZ7 (GLOVE) ×2 IMPLANT
GLOVE BIOGEL PI IND STRL 7.0 (GLOVE) ×1 IMPLANT
GLOVE BIOGEL PI INDICATOR 7.0 (GLOVE) ×1
GOWN STRL REUS W/TWL LRG LVL3 (GOWN DISPOSABLE) ×6 IMPLANT
IV STOPCOCK 4 WAY 40  W/Y SET (IV SOLUTION)
IV STOPCOCK 4 WAY 40 W/Y SET (IV SOLUTION) IMPLANT
NEEDLE HYPO 22GX1.5 SAFETY (NEEDLE) ×2 IMPLANT
PACK ABDOMINAL GYN (CUSTOM PROCEDURE TRAY) ×2 IMPLANT
PAD ABD 7.5X8 STRL (GAUZE/BANDAGES/DRESSINGS) ×2 IMPLANT
PAD DRESSING TELFA 3X8 NADH (GAUZE/BANDAGES/DRESSINGS) IMPLANT
PAD OB MATERNITY 4.3X12.25 (PERSONAL CARE ITEMS) ×2 IMPLANT
PENCIL SMOKE EVAC W/HOLSTER (ELECTROSURGICAL) ×2 IMPLANT
PROTECTOR NERVE ULNAR (MISCELLANEOUS) ×2 IMPLANT
SPONGE GAUZE 4X4 12PLY STER LF (GAUZE/BANDAGES/DRESSINGS) ×4 IMPLANT
STRIP CLOSURE SKIN 1/2X4 (GAUZE/BANDAGES/DRESSINGS) ×2 IMPLANT
SUT CHROMIC 0 CT 1 36 (SUTURE) ×4 IMPLANT
SUT MNCRL+ AB 3-0 CT1 36 (SUTURE) IMPLANT
SUT MON AB 3-0 SH 27 (SUTURE) ×10
SUT MON AB 3-0 SH27 (SUTURE) IMPLANT
SUT MON AB 4-0 PS1 27 (SUTURE) ×3 IMPLANT
SUT MONOCRYL AB 3-0 CT1 36IN (SUTURE) ×3
SUT PDS AB 0 CT1 27 (SUTURE) ×4 IMPLANT
SUT PDS AB 0 CTX 60 (SUTURE) ×2 IMPLANT
SUT VIC AB 0 CT1 18XCR BRD8 (SUTURE) IMPLANT
SUT VIC AB 0 CT1 8-18 (SUTURE) ×2
SUT VIC AB 2-0 CT1 27 (SUTURE) ×10
SUT VIC AB 2-0 CT1 TAPERPNT 27 (SUTURE) ×2 IMPLANT
SYR 3ML 22GX1 1/2 SAFETY (SYRINGE) ×1 IMPLANT
SYR 50ML LL SCALE MARK (SYRINGE) IMPLANT
SYR 5ML LL (SYRINGE) ×2 IMPLANT
SYR CONTROL 10ML LL (SYRINGE) ×3 IMPLANT
TOWEL OR 17X24 6PK STRL BLUE (TOWEL DISPOSABLE) ×4 IMPLANT
TRAY FOLEY CATH SILVER 14FR (SET/KITS/TRAYS/PACK) ×2 IMPLANT

## 2018-02-21 NOTE — Progress Notes (Signed)
The patient was re-examined with no change in status 

## 2018-02-21 NOTE — Op Note (Signed)
Preoperative diagnosis: Symptomatic leiomyoma  Postoperative diagnosis: Same  Procedure: Laparotomy with myomectomy  Surgeon: Matthew Saras  Assistant: McComb  EBL: 250 cc  Procedure and findings:  Patient was taken to the operating room after an adequate level of general anesthesia was obtained with the patient in the supine position the abdomen prepped and draped, perineum prepped draped separately, Foley catheter position.  Appropriate timeouts were taken at that point.  Transverse incision was made excising the old scar this was carried down to the fascia which was incised and extended transversely.  Rectus muscles divided in the midline peritoneum entered superiorly without incident.  There was a large solitary 15 x 16 cm fundal/posterior fibroid, multiple attempts even to enlarged this transverse incision, could not deliver this fibroid which was otherwise mobile.  The skin and fascia were then incised vertically approximately 7 cm to facilitate more room, this allowed delivery of the entire uterus.  The pelvic findings as follows:  There was a large 15 x 16 cm fundal posterior fibroid noted no other fibroids were noted or palpated bilateral adnexa were otherwise unremarkable the upper abdomen was negative.  Dilute Pitressin solution was then injected posteriorly in the midline, the area of dissection the fibroid was free and clear of the tubal areas.  Incision made with a Bovie with mostly blunt dissection the large fibroid which appeared to be partially degenerated was dissected free, it came out pretty cleanly from its capsule.  Once this was removed inspection revealed no further fibroids noted.  The myometrial edges were trimmed, hemostasis with the Bovie, she then had deeper layer 0 chromic interrupted sutures there were actually 2-3 layers to reapproximate the myometrium.  This was hemostatic at this point.  Then, interrupted 2-0 Vicryl sutures were then used to close the serosa with excellent  hemostasis.  Interceed was then placed along the suture line was intact and the corners to prevent sliding with 4-0 Vicryl sutures.  Saline was then placed on the Interceed for attachment, no other abnormalities were noted.  The uterus was returned to the pelvis prior to closure sponge, needle, instrument counts reported as correct x2.  Peritoneum closed with 3-0 Vicryl running suture the same on the rectus muscles in the midline the vertical fascia closed with a 0 PDS suture down to the lower incision then a doubly looped 0 PDS was then used to close the fascia transversely.  Subcutaneous tissue on the vertical portion was reapproximated with 3-0 Vicryl interrupted sutures.  Diluted Exparel solution with 40 of saline, Exparel, and 20 cc of quarter percent Marcaine was then injected into the subfascial and subcutaneous areas for postoperative analgesia.  This all appeared to be hemostatic, 4-0 Monocryl subcuticular skin closure both vertically and horizontally with good approximation followed by benzoin, half-inch Steri-Strips and a pressure dressing.  She tolerated this well went to recovery room in good condition.  Dictated with [employer] 1  Margarette Asal MD

## 2018-02-21 NOTE — H&P (Signed)
[name] is an 32 y.o. female. Presents for myomectomy for 12X13 cm post fibroid a/w incr pelvic pain  Pertinent Gynecological History: Menses: flow is moderate Bleeding: reg cycles Contraception: Nexplanon DES exposure: denies Blood transfusions: none Sexually transmitted diseases: no past history Previous GYN Procedures: CS  Last mammogram: NA Date:  Last pap: normal Date:  OB History: G1, P1   Menstrual History: Menarche age: 41 No LMP recorded. Patient has had an implant.    Past Medical History:  Diagnosis Date  . Allergic rhinitis due to pollen 02/06/2010  . Esophageal reflux 11/29/2008  . HEADACHE, CLUSTER, CHRONIC 02/06/2010   patient reports migraines, not cluster headaches from problem list  . Hypertension   . Multiple sclerosis ([place])   . Neuromuscular disorder ([place])    multiple sclerosis    Past Surgical History:  Procedure Laterality Date  . CESAREAN SECTION  06/29/2012   Procedure: CESAREAN SECTION;  Surgeon: Allena Katz, MD;  Location: [facility];  Service: Obstetrics;  Laterality: N/A;  . IUD REMOVAL  2018    Family History  Problem Relation Age of Onset  . Lung cancer Maternal Grandfather   . Hypertension Mother   . Hypertension Father   . Mental illness Maternal Grandmother   . Diabetes Maternal Grandmother   . Glaucoma Paternal Grandmother   . Hypertension Paternal Grandfather     Social History:  reports that she has never smoked. She has never used smokeless tobacco. She reports that she drinks alcohol. She reports that she does not use drugs.  Allergies: No Known Allergies  Medications Prior to Admission  Medication Sig Dispense Refill Last Dose  . etonogestrel (NEXPLANON) 68 MG IMPL implant Inject 1 application into the skin once.    Taking  . fluticasone (FLONASE) 50 MCG/ACT nasal spray TWO SPRAYS EACH NOSTRIL ONCE A DAY (Patient taking differently: TWO SPRAYS EACH NOSTRIL ONCE A DAY AS NEEDED) 1 g 0 Taking  . natalizumab  (TYSABRI) 300 MG/15ML injection Inject 300 mg into the vein every 30 (thirty) days.   02/20/2018 at Unknown time  . oxybutynin (DITROPAN XL) 10 MG 24 hr tablet Take 1 tablet (10 mg total) by mouth at bedtime. 30 tablet 11 02/20/2018 at Unknown time  . phentermine 37.5 MG capsule Take 1 capsule (37.5 mg total) by mouth daily. 30 capsule 5 Past Month at Unknown time  . triamterene-hydrochlorothiazide (MAXZIDE-25) 37.5-25 MG tablet Take 1 tablet by mouth daily. 90 tablet 3 02/20/2018 at Unknown time    ROS  Blood pressure 137/79, pulse 85, temperature 98.7 F (37.1 C), temperature source Oral, resp. rate 16, SpO2 100 %. Physical Exam  Constitutional: She is oriented to person, place, and time. She appears well-developed and well-nourished.  HENT:  Head: Normocephalic and atraumatic.  Neck: Normal range of motion. Neck supple.  Cardiovascular: Normal rate and regular rhythm.  Respiratory: Effort normal and breath sounds normal.  GI: Soft. Bowel sounds are normal.  Genitourinary:  Genitourinary Comments: 12 wk size post, ad neg  Musculoskeletal: Normal range of motion.  Neurological: She is alert and oriented to person, place, and time.  Skin: Skin is warm and dry.    Results for orders placed or performed during the hospital encounter of 02/21/18 (from the past 24 hour(s))  Pregnancy, urine     Status: None   Collection Time: 02/21/18  6:00 AM  Result Value Ref Range   Preg Test, Ur NEGATIVE NEGATIVE    No results found.  Assessment/Plan: Symptomatic  leiomyoma>>myiomectomy, discussed risks of bleeding, infxn, transfusion, need for future CS  Margarette Asal 02/21/2018, 6:49 AM

## 2018-02-21 NOTE — Progress Notes (Deleted)
Initial visit with [name] and her family to introduce spiritual care services as they await further news on twin daughters [name] and [name].  [name] states that she's starting to experience some pain and feels pretty sure that she's going to have the babies today.  She is anxious, but trying to trust the process.  FOB states he is coping okay.  [name] has not been able to tour the NICU yet and her 31 year old was not a NICU baby.  She expressed gratitude for the visit and is aware of how to reach the chaplain.    -As I was leaving her room, her OB was entering.  Pt's RN stated that she was about to go down for a c-Section.  WIll continue to follow.  Please page as further needs arise.  Donald Prose. Elyn Peers, M.Div. [facility] Chaplain Pager [phone] Office [phone]

## 2018-02-21 NOTE — Transfer of Care (Signed)
Immediate Anesthesia Transfer of Care Note  Patient: [name]  Procedure(s) Performed: MYOMECTOMY, ABDOMINAL (N/A )  Patient Location: PACU  Anesthesia Type:General  Level of Consciousness: awake, drowsy and patient cooperative  Airway & Oxygen Therapy: Patient Spontanous Breathing and Patient connected to nasal cannula oxygen  Post-op Assessment: Report given to RN and Post -op Vital signs reviewed and stable  Post vital signs: Reviewed and stable  Last Vitals:  Vitals Value Taken Time  BP    Temp    Pulse 100 02/21/2018  9:07 AM  Resp 19 02/21/2018  9:07 AM  SpO2 100 % 02/21/2018  9:07 AM  Vitals shown include unvalidated device data.  Last Pain:  Vitals:   02/21/18 0556  TempSrc: Oral      Patients Stated Pain Goal: 3 (96/43/83 8184)  Complications: No apparent anesthesia complications

## 2018-02-21 NOTE — Anesthesia Procedure Notes (Signed)
Procedure Name: Intubation Date/Time: 02/21/2018 7:21 AM Performed by: Georgeanne Nim, CRNA Pre-anesthesia Checklist: Patient identified, Emergency Drugs available, Suction available, Patient being monitored and Timeout performed Patient Re-evaluated:Patient Re-evaluated prior to induction Oxygen Delivery Method: Circle system utilized Preoxygenation: Pre-oxygenation with 100% oxygen Induction Type: IV induction Ventilation: Mask ventilation without difficulty Laryngoscope Size: Mac and 3 Grade View: Grade I Tube type: Oral Tube size: 7.0 mm Number of attempts: 1 Airway Equipment and Method: Stylet Placement Confirmation: ETT inserted through vocal cords under direct vision,  positive ETCO2,  CO2 detector and breath sounds checked- equal and bilateral Secured at: 21 cm Tube secured with: Tape Dental Injury: Teeth and Oropharynx as per pre-operative assessment

## 2018-02-21 NOTE — Anesthesia Postprocedure Evaluation (Signed)
Anesthesia Post Note  Patient: [name]  Procedure(s) Performed: MYOMECTOMY, ABDOMINAL (N/A )     Patient location during evaluation: [facility] Anesthesia Type: General Level of consciousness: awake Pain management: satisfactory to patient Vital Signs Assessment: post-procedure vital signs reviewed and stable Respiratory status: spontaneous breathing Cardiovascular status: stable Anesthetic complications: no    Last Vitals:  Vitals:   02/21/18 1329 02/21/18 1657  BP: 122/61 132/66  Pulse: 84 85  Resp: 19 17  Temp: 36.9 C (!) 36.4 C  SpO2: 99% 99%    Last Pain:  Vitals:   02/21/18 1657  TempSrc: Oral  PainSc:    Pain Goal: Patients Stated Pain Goal: 3 (02/21/18 0556)               Casimer Lanius

## 2018-02-22 ENCOUNTER — Encounter (HOSPITAL_COMMUNITY): Payer: Self-pay | Admitting: Obstetrics and Gynecology

## 2018-02-22 LAB — CBC
HCT: 29.4 % — ABNORMAL LOW (ref 36.0–46.0)
Hemoglobin: 10.1 g/dL — ABNORMAL LOW (ref 12.0–15.0)
MCH: 28 pg (ref 26.0–34.0)
MCHC: 34.4 g/dL (ref 30.0–36.0)
MCV: 81.4 fL (ref 78.0–100.0)
PLATELETS: 233 10*3/uL (ref 150–400)
RBC: 3.61 MIL/uL — AB (ref 3.87–5.11)
RDW: 16.1 % — AB (ref 11.5–15.5)
WBC: 16 10*3/uL — AB (ref 4.0–10.5)

## 2018-02-22 NOTE — Progress Notes (Signed)
1 Day Post-Op Procedure(s) (LRB): MYOMECTOMY, ABDOMINAL (N/A)  Subjective: Patient reports tolerating PO.    Objective: I have reviewed patient's vital signs, medications and labs. BP 118/60 (BP Location: Right Arm)   Pulse 88   Temp 98.1 F (36.7 C) (Oral)   Resp 17   Ht 5' 4.5" (1.638 m)   Wt 203 lb (92.1 kg)   SpO2 98%   BMI 34.31 kg/m  CBC    Component Value Date/Time   WBC 16.0 (H) 02/22/2018 0552   RBC 3.61 (L) 02/22/2018 0552   HGB 10.1 (L) 02/22/2018 0552   HGB 12.3 02/17/2018 0859   HCT 29.4 (L) 02/22/2018 0552   HCT 36.8 02/17/2018 0859   PLT 233 02/22/2018 0552   PLT 297 02/17/2018 0859   MCV 81.4 02/22/2018 0552   MCV 83 02/17/2018 0859   MCH 28.0 02/22/2018 0552   MCHC 34.4 02/22/2018 0552   RDW 16.1 (H) 02/22/2018 0552   RDW 16.0 (H) 02/17/2018 0859   LYMPHSABS 5.6 (H) 02/17/2018 0859   MONOABS 0.3 03/31/2016 0626   EOSABS 0.3 02/17/2018 0859   BASOSABS 0.0 02/17/2018 0859    abd soft + BS Dressing dry Assessment: s/p Procedure(s): MYOMECTOMY, ABDOMINAL (N/A): stable  Plan: Advance diet Encourage ambulation Advance to PO medication  LOS: 1 day    Margarette Asal 02/22/2018, 8:37 AM

## 2018-02-22 NOTE — Progress Notes (Signed)
Chaplain progress note from 02/21/18 was entered into this patient chart by accident and does not reflect any information about this pt. I was alerted by pt's RN that this note was intended for a different patient with a similar name.  [place], [facility] Pager, [phone] 1:17 PM

## 2018-02-23 MED ORDER — OXYCODONE-ACETAMINOPHEN 5-325 MG PO TABS: 1.0000 | ORAL_TABLET | Freq: Four times a day (QID) | ORAL | 0 refills | Status: DC | PRN

## 2018-02-23 MED ORDER — IBUPROFEN 800 MG PO TABS: 800.0000 mg | ORAL_TABLET | Freq: Three times a day (TID) | ORAL | 0 refills | Status: AC | PRN

## 2018-02-23 NOTE — Discharge Summary (Signed)
Physician Discharge Summary  Patient ID: [name] MRN: [id number] DOB/AGE: 01-23-1986 32 y.o.  Admit date: 02/21/2018 Discharge date: 02/23/2018  Admission [facility]  Discharge Diagnoses: SAME Active Problems:   Leiomyoma   Discharged Condition: good  Hospital Course: ADM FOR abd myomectomy, was D/C on POD 2, afeb, tol PO with bandage clean/dry  Consults: None  Significant Diagnostic Studies: labs:  CBC    Component Value Date/Time   WBC 16.0 (H) 02/22/2018 0552   RBC 3.61 (L) 02/22/2018 0552   HGB 10.1 (L) 02/22/2018 0552   HGB 12.3 02/17/2018 0859   HCT 29.4 (L) 02/22/2018 0552   HCT 36.8 02/17/2018 0859   PLT 233 02/22/2018 0552   PLT 297 02/17/2018 0859   MCV 81.4 02/22/2018 0552   MCV 83 02/17/2018 0859   MCH 28.0 02/22/2018 0552   MCHC 34.4 02/22/2018 0552   RDW 16.1 (H) 02/22/2018 0552   RDW 16.0 (H) 02/17/2018 0859   LYMPHSABS 5.6 (H) 02/17/2018 0859   MONOABS 0.3 03/31/2016 0626   EOSABS 0.3 02/17/2018 0859   BASOSABS 0.0 02/17/2018 0859     Treatments: dialysis: Myomectomy  Discharge Exam: Blood pressure (!) 108/58, pulse 69, temperature 98.3 F (36.8 C), temperature source Oral, resp. rate 16, height 5' 4.5" (1.638 m), weight 203 lb (92.1 kg), SpO2 98 %. abd soft + BS, bandage dry  Disposition: Discharge disposition: 01-Home or Self Care        Allergies as of 02/23/2018   No Known Allergies     Medication List    STOP taking these medications   NEXPLANON 68 MG Impl implant Generic drug:  etonogestrel     TAKE these medications   fluticasone 50 MCG/ACT nasal spray Commonly known as:  FLONASE TWO SPRAYS EACH NOSTRIL ONCE A DAY What changed:  See the new instructions.   ibuprofen 800 MG tablet Commonly known as:  ADVIL,MOTRIN Take 1 tablet (800 mg total) by mouth every 8 (eight) hours as needed (mild pain).   natalizumab 300 MG/15ML injection Commonly known as:  TYSABRI Inject 300 mg into the vein every 30  (thirty) days.   oxybutynin 10 MG 24 hr tablet Commonly known as:  DITROPAN XL Take 1 tablet (10 mg total) by mouth at bedtime.   oxyCODONE-acetaminophen 5-325 MG tablet Commonly known as:  PERCOCET/ROXICET Take 1-2 tablets by mouth every 6 (six) hours as needed for severe pain.   phentermine 37.5 MG capsule Take 1 capsule (37.5 mg total) by mouth daily.   triamterene-hydrochlorothiazide 37.5-25 MG tablet Commonly known as:  MAXZIDE-25 Take 1 tablet by mouth daily.      Follow-up Information    Molli Posey, MD. Schedule an appointment as soon as possible for a visit in 1 week(s).   Specialty:  Obstetrics and Gynecology Contact information: [address] [place] [address] [phone]           Signed: Margarette Asal 02/23/2018, 7:56 AM

## 2018-02-24 ENCOUNTER — Encounter: Payer: Self-pay | Admitting: *Deleted

## 2018-03-24 DIAGNOSIS — G35 Multiple sclerosis: Secondary | ICD-10-CM | POA: Diagnosis not present

## 2018-03-30 DIAGNOSIS — D509 Iron deficiency anemia, unspecified: Secondary | ICD-10-CM | POA: Diagnosis not present

## 2018-04-17 ENCOUNTER — Encounter: Payer: Self-pay | Admitting: *Deleted

## 2018-04-20 DIAGNOSIS — G35 Multiple sclerosis: Secondary | ICD-10-CM | POA: Diagnosis not present

## 2018-05-10 ENCOUNTER — Other Ambulatory Visit: Payer: Self-pay | Admitting: Neurology

## 2018-05-11 DIAGNOSIS — J45991 Cough variant asthma: Secondary | ICD-10-CM | POA: Diagnosis not present

## 2018-05-16 DIAGNOSIS — G35 Multiple sclerosis: Secondary | ICD-10-CM | POA: Diagnosis not present

## 2018-06-06 DIAGNOSIS — J45991 Cough variant asthma: Secondary | ICD-10-CM | POA: Diagnosis not present

## 2018-06-13 DIAGNOSIS — G35 Multiple sclerosis: Secondary | ICD-10-CM | POA: Diagnosis not present

## 2018-07-11 DIAGNOSIS — G35 Multiple sclerosis: Secondary | ICD-10-CM | POA: Diagnosis not present

## 2018-08-08 DIAGNOSIS — G35 Multiple sclerosis: Secondary | ICD-10-CM | POA: Diagnosis not present

## 2018-08-25 ENCOUNTER — Other Ambulatory Visit: Payer: Self-pay

## 2018-08-25 ENCOUNTER — Ambulatory Visit: Payer: 59 | Admitting: Neurology

## 2018-08-25 ENCOUNTER — Encounter: Payer: Self-pay | Admitting: Neurology

## 2018-08-25 DIAGNOSIS — G43109 Migraine with aura, not intractable, without status migrainosus: Secondary | ICD-10-CM | POA: Diagnosis not present

## 2018-08-25 DIAGNOSIS — F418 Other specified anxiety disorders: Secondary | ICD-10-CM | POA: Diagnosis not present

## 2018-08-25 DIAGNOSIS — Z79899 Other long term (current) drug therapy: Secondary | ICD-10-CM | POA: Diagnosis not present

## 2018-08-25 DIAGNOSIS — G35 Multiple sclerosis: Secondary | ICD-10-CM | POA: Diagnosis not present

## 2018-08-25 DIAGNOSIS — F419 Anxiety disorder, unspecified: Secondary | ICD-10-CM | POA: Insufficient documentation

## 2018-08-25 DIAGNOSIS — G959 Disease of spinal cord, unspecified: Secondary | ICD-10-CM

## 2018-08-25 MED ORDER — ESCITALOPRAM OXALATE 10 MG PO TABS: 10.0000 mg | ORAL_TABLET | Freq: Every day | ORAL | 3 refills | Status: DC

## 2018-08-25 MED ORDER — RIZATRIPTAN BENZOATE 10 MG PO TBDP: 10.0000 mg | ORAL_TABLET | ORAL | 3 refills | Status: DC | PRN

## 2018-08-25 MED ORDER — PHENTERMINE HCL 37.5 MG PO CAPS: 37.5000 mg | ORAL_CAPSULE | Freq: Every day | ORAL | 5 refills | Status: DC

## 2018-08-25 NOTE — Progress Notes (Signed)
[facility]  PATIENT: [name] DOB: 06/06/1986  REFERRING DOCTOR OR PCP:  Garret Reddish SOURCE: Patient, notes from [facility], lab and other results from [facility], MRI images personally reviewed on PACS  _________________________________   HISTORICAL  CHIEF COMPLAINT:  Chief Complaint  Patient presents with  . Multiple Sclerosis    Sts. she continues to tolerate Tysabri well.  JCV ab last checked 02/17/18 was Indeterminate at 0.29, with assay pending/fim    HISTORY OF PRESENT ILLNESS:  [name] is a 32 y.o. woman with MS.       Update 08/25/2018: She feels her MS has done well for the most part but she has noted some depression and headache.    She is on Tysabri and infusion have gone well.   She has a JCVAb titer of 0.29 last check and we will recheck again today.   Gait is fine.   No new numbness or weakness.   Bladder is doing well.    Vision is stable.    She has felt a nervousness the last 4 months with anxiety.  At times it is more intense for 10-15 minutes with heart pounding.      She notes mild depression.    She has some stress but there is little change from last year.   She notes mild fatigue, helped by phentermine.   She also lost 7 pounds.      She gets migraine headaches about twice a month.  These usually last one day.  She has not tried triptans in the past.  Update 02/17/2018: Her Tysabri infusions are doing well.   She gets some cramping in the left leg and the right forearm/hand at times.   She is JCV Ab negative last tested 04/2017  She has to walk a long distance from the car to her work and she cramps up more during the walk.    Her balance is ok.    Strength is mostly fine unless she walks longer distances.    She denies constant numbness or tingling.  However, if she sits a long time, her left leg goes numb and she needs to get up to moves.   Her bladder function is much better on ditropan.  Vision is stable.   She wears  glasses.  No MS related visual issues.    She has low fatigue in the morning but she struggles to get out of bed.  Fatigue is worse in the afternoon.   She is off phentermine until after gyn surgery.  She gets 5-6 hours sleep most nights.  Mood is fine.   She denies cognitive issues.   She gets some headaches usually in the forehead in the afternoon.   No N/V.   No photo/phonophobia.     From 04/18/2017: She started Tysabri June 2017 and is tolerating it very well.  She denies any exacerbations and old symptoms have mostly resolved.    Strength/sensation/gait: Her gait and balance are doing well.    She denies weakness but she gets cramps in her leg muscles, usually in the afternoons.   She exercises (works out in gym) daily in the mornings.     She denies arm numbness/tingling now.    Vision: She does not note any difficulty with visual acuity now or in the past. There is no diplopia. There is no eye pain.  . She does wear glasses.  Bladder/bowel: Bladder urgency and nocturia is better on Ditropan.   It  also helps her sleep since she no longer wakes up for nocturia   Fatigue/sleep: She rarely has fatigue in the mornings but will have some afternoons.  Phentermine helps fatigue.   SHe is now sleeping better.   She does not need trazodone now  Mood/cognition: She denies any depression or anxiety. She has not noted any significant changes with cognitive function.   MS History:   In May, 2017,  she had the onset of right arm numbness and weakness that developed over one day.       She went to the emergency room and an MRI of the brain on 03/28/2016 showed multiple T2/FLAIR hyperintense foci consistent with multiple sclerosis. She was admitted and received 5 days of IV steroids. While in the hospital she had a contrasted MRI of the brain that showed that 2 of those lesions enhanced consistent with more acute onset. Additionally, she had 2 lesions in the cervical spine, both enhancing. The one at C6 was  towards the right and could explain her symptoms. Another one was adjacent to C3-C4, posteriorly. She also had a lumbar puncture. The cerebrospinal fluid shows that there were 9 oligoclonal bands in the CSF which is also consistent with multiple sclerosis. Due to the aggressiveness of her MS, she was started on Tysabri. She is JCV antibody negative (0.25).   In retrospect, in 2011, she had headaches and tremors and had an MRI of the brain. It showed a single T2/FLAIR hyperintense focus in the right periventricular white matter of the parietal lobe.   Follow-up MRI in 2012 did not show any new lesions. The symptoms improved and she had no other symptoms until 2017  REVIEW OF SYSTEMS: Review of Systems  Constitutional: Positive for malaise/fatigue.  HENT: Negative for congestion and hearing loss.   Eyes: Negative.  Negative for blurred vision, double vision, photophobia and pain.  Respiratory: Negative for cough.   Cardiovascular: Negative for chest pain.  Gastrointestinal: Negative for abdominal pain, nausea and vomiting.  Genitourinary: Positive for frequency and urgency. Negative for dysuria.  Musculoskeletal: Negative for back pain and neck pain.  Skin: Negative for itching and rash.  Neurological: Negative.   Endo/Heme/Allergies: Negative.   Psychiatric/Behavioral: Negative for depression. The patient has insomnia.     ALLERGIES: No Known Allergies  HOME MEDICATIONS:  Current Outpatient Medications:  .  fluticasone (FLONASE) 50 MCG/ACT nasal spray, TWO SPRAYS EACH NOSTRIL ONCE A DAY (Patient taking differently: TWO SPRAYS EACH NOSTRIL ONCE A DAY AS NEEDED), Disp: 1 g, Rfl: 0 .  ibuprofen (ADVIL,MOTRIN) 800 MG tablet, Take 1 tablet (800 mg total) by mouth every 8 (eight) hours as needed (mild pain)., Disp: 30 tablet, Rfl: 0 .  natalizumab (TYSABRI) 300 MG/15ML injection, Inject 300 mg into the vein every 30 (thirty) days., Disp: , Rfl:  .  oxybutynin (DITROPAN XL) 10 MG 24 hr tablet,  Take 1 tablet (10 mg total) by mouth at bedtime., Disp: 30 tablet, Rfl: 11 .  oxyCODONE-acetaminophen (PERCOCET/ROXICET) 5-325 MG tablet, Take 1-2 tablets by mouth every 6 (six) hours as needed for severe pain., Disp: 30 tablet, Rfl: 0 .  phentermine 37.5 MG capsule, Take 1 capsule (37.5 mg total) by mouth daily., Disp: 30 capsule, Rfl: 5 .  triamterene-hydrochlorothiazide (MAXZIDE-25) 37.5-25 MG tablet, Take 1 tablet by mouth daily., Disp: 90 tablet, Rfl: 3 .  escitalopram (LEXAPRO) 10 MG tablet, Take 1 tablet (10 mg total) by mouth daily., Disp: 90 tablet, Rfl: 3 .  rizatriptan (MAXALT-MLT) 10 MG disintegrating  tablet, Take 1 tablet (10 mg total) by mouth as needed for migraine. May repeat in 2 hours if needed, Disp: 9 tablet, Rfl: 3  PAST MEDICAL HISTORY: Past Medical History:  Diagnosis Date  . Allergic rhinitis due to pollen 02/06/2010  . Esophageal reflux 11/29/2008  . HEADACHE, CLUSTER, CHRONIC 02/06/2010   patient reports migraines, not cluster headaches from problem list  . Hypertension   . Multiple sclerosis ([place])   . Neuromuscular disorder ([place])    multiple sclerosis    PAST SURGICAL HISTORY: Past Surgical History:  Procedure Laterality Date  . CESAREAN SECTION  06/29/2012   Procedure: CESAREAN SECTION;  Surgeon: Allena Katz, MD;  Location: [facility];  Service: Obstetrics;  Laterality: N/A;  . IUD REMOVAL  2018  . MYOMECTOMY N/A 02/21/2018   Procedure: MYOMECTOMY, ABDOMINAL;  Surgeon: Molli Posey, MD;  Location: [facility];  Service: Gynecology;  Laterality: N/A;    FAMILY HISTORY: Family History  Problem Relation Age of Onset  . Lung cancer Maternal Grandfather   . Hypertension Mother   . Hypertension Father   . Mental illness Maternal Grandmother   . Diabetes Maternal Grandmother   . Glaucoma Paternal Grandmother   . Hypertension Paternal Grandfather     SOCIAL HISTORY:  Social History   Socioeconomic History  . Marital status: Single    Spouse name: Not on  file  . Number of children: Not on file  . Years of education: Not on file  . Highest education level: Not on file  Occupational History  . Not on file  Social Needs  . Financial resource strain: Not on file  . Food insecurity:    Worry: Not on file    Inability: Not on file  . Transportation needs:    Medical: Not on file    Non-medical: Not on file  Tobacco Use  . Smoking status: Never Smoker  . Smokeless tobacco: Never Used  Substance and Sexual Activity  . Alcohol use: Yes    Alcohol/week: 0.0 - 1.0 standard drinks    Comment: occ  . Drug use: No  . Sexual activity: Yes    Birth control/protection: IUD  Lifestyle  . Physical activity:    Days per week: Not on file    Minutes per session: Not on file  . Stress: Not on file  Relationships  . Social connections:    Talks on phone: Not on file    Gets together: Not on file    Attends religious service: Not on file    Active member of club or organization: Not on file    Attends meetings of clubs or organizations: Not on file    Relationship status: Not on file  . Intimate partner violence:    Fear of current or ex partner: Not on file    Emotionally abused: Not on file    Physically abused: Not on file    Forced sexual activity: Not on file  Other Topics Concern  . Not on file  Social History Narrative   Lives at home with son (born 2013)  and son's father.       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    Prior  Advertising account executive (Pharmacist, community) Garment/textile technologist.        Wears glasses.   Caffeine none.     PHYSICAL EXAM  There were no vitals filed for this visit.  There is no height or weight on file to  calculate BMI.   General: The patient is well-developed and well-nourished and in no acute distress  Extremities:    No rash or edema.  Neurologic Exam  Mental status: The patient is alert and oriented x 3 at the time of the examination. The patient has apparent normal recent and  remote memory, with an apparently normal attention span and concentration ability.   Speech is normal.  Cranial nerves: Extraocular movements are full.  Facial strength and sensation was normal.  Trapezius strength was normal.. No obvious hearing deficits are noted.  Motor: Muscle bulk, tone and strength are normal.  Sensory: Sensory testing is intact to touch and vibration sensation in the arms and legs.   Coordination: Cerebellar testing reveals good finger-nose-finger and heel-to-shin bilaterally.  Gait and station: The station is normal and the gait is normal.  Her tandem gait is slightly wide..   Romberg is negative.    Reflexes: Deep tendon reflexes are normal and symmetric bilaterally     DIAGNOSTIC DATA (LABS, IMAGING, TESTING) - I reviewed patient records, labs, notes, testing and imaging myself where available.  Lab Results  Component Value Date   WBC 16.0 (H) 02/22/2018   HGB 10.1 (L) 02/22/2018   HCT 29.4 (L) 02/22/2018   MCV 81.4 02/22/2018   PLT 233 02/22/2018      Component Value Date/Time   NA 134 (L) 02/15/2018 0910   K 3.4 (L) 02/15/2018 0910   CL 101 02/15/2018 0910   CO2 23 02/15/2018 0910   GLUCOSE 114 (H) 02/15/2018 0910   BUN 14 02/15/2018 0910   CREATININE 0.86 02/15/2018 0910   CALCIUM 9.5 02/15/2018 0910   PROT 6.5 03/31/2016 0626   ALBUMIN 3,800 03/31/2016 1024   AST 27 03/31/2016 0626   ALT 40 03/31/2016 0626   ALKPHOS 42 03/31/2016 0626   BILITOT 0.3 03/31/2016 0626   GFRNONAA >60 02/15/2018 0910   GFRAA >60 02/15/2018 0910    Lab Results  Component Value Date   TSH 0.70 11/29/2008       ASSESSMENT AND PLAN  Multiple sclerosis ([place]) - Plan: MR BRAIN W WO CONTRAST, Stratify JCV Antibody Test ([facility]), CBC with Differential/Platelet  High risk medication use - Plan: Stratify JCV Antibody Test ([facility]), CBC with Differential/Platelet  Depression with anxiety  Migraine with aura and without status migrainosus, not  intractable  Cervical myelopathy ([place])    1.  Continue Tysabri.  We will check the CBC and JCV antibody today.  We have discussed potentially switching to a different medication if the JCV antibody is elevated.  Additionally we will check an MRI of the brain to determine if she is having subclinical progression.  If present, consider a switch in DMT. 2.  Continue medications.  Phentermine was renewed.   Trial of rizatriptan as needed migraine 3.   She appears to have anxiety with mild depression.  I will start Lexapro.  If symptoms worsen consider referral to behavioral health. 4.   She will return to see me in 6 months or call sooner if she has new or worsening neurologic symptoms.  Richard A. Felecia Shelling, MD, PhD 16/55/3748, 2:70 AM Certified in Neurology, Clinical Neurophysiology, Sleep Medicine, Pain Medicine and Neuroimaging  [facility] [address], [place], [place] [address] [phone]

## 2018-08-26 LAB — CBC WITH DIFFERENTIAL/PLATELET
BASOS: 0 %
Basophils Absolute: 0 10*3/uL (ref 0.0–0.2)
EOS (ABSOLUTE): 0.2 10*3/uL (ref 0.0–0.4)
EOS: 2 %
HEMATOCRIT: 36.8 % (ref 34.0–46.6)
Hemoglobin: 12.2 g/dL (ref 11.1–15.9)
Immature Grans (Abs): 0 10*3/uL (ref 0.0–0.1)
Immature Granulocytes: 0 %
LYMPHS ABS: 5.3 10*3/uL — AB (ref 0.7–3.1)
Lymphs: 57 %
MCH: 27.2 pg (ref 26.6–33.0)
MCHC: 33.2 g/dL (ref 31.5–35.7)
MCV: 82 fL (ref 79–97)
Monocytes Absolute: 0.8 10*3/uL (ref 0.1–0.9)
Monocytes: 9 %
NEUTROS ABS: 2.9 10*3/uL (ref 1.4–7.0)
Neutrophils: 32 %
Platelets: 314 10*3/uL (ref 150–450)
RBC: 4.48 x10E6/uL (ref 3.77–5.28)
RDW: 15.8 % — AB (ref 12.3–15.4)
WBC: 9.3 10*3/uL (ref 3.4–10.8)

## 2018-08-28 ENCOUNTER — Telehealth: Payer: Self-pay | Admitting: Neurology

## 2018-08-28 MED ORDER — ALPRAZOLAM 0.5 MG PO TABS: ORAL_TABLET | ORAL | 0 refills | Status: DC

## 2018-08-28 NOTE — Telephone Encounter (Signed)
Patient returned my call she is scheduled for 08/29/18 at [facility].  She also informed me she is claustrophobic and would like something to help her.

## 2018-08-28 NOTE — Addendum Note (Signed)
Addended by: France Ravens I on: 08/28/2018 01:57 PM   Modules accepted: Orders

## 2018-08-28 NOTE — Telephone Encounter (Signed)
Alprazolam faxed to [facility]/fim

## 2018-08-28 NOTE — Telephone Encounter (Signed)
Rx. awaiting RAS sig/fim 

## 2018-08-28 NOTE — Telephone Encounter (Signed)
lvm for pt to call back about scheduling MRI  [employer] Auth: [place] via [facility]

## 2018-08-29 ENCOUNTER — Ambulatory Visit (INDEPENDENT_AMBULATORY_CARE_PROVIDER_SITE_OTHER): Payer: 59

## 2018-08-29 DIAGNOSIS — G35 Multiple sclerosis: Secondary | ICD-10-CM | POA: Diagnosis not present

## 2018-08-29 MED ORDER — GADOTERIDOL 279.3 MG/ML IV SOLN: 13.0000 mL | Freq: Once | INTRAVENOUS | Status: DC | PRN

## 2018-08-29 MED ORDER — GADOBENATE DIMEGLUMINE 529 MG/ML IV SOLN
18.0000 mL | Freq: Once | INTRAVENOUS | Status: AC | PRN
Start: 2018-08-29 — End: 2018-08-29
  Administered 2018-08-29: 18 mL via INTRAVENOUS

## 2018-08-30 ENCOUNTER — Telehealth: Payer: Self-pay | Admitting: *Deleted

## 2018-08-30 DIAGNOSIS — R0789 Other chest pain: Secondary | ICD-10-CM | POA: Diagnosis not present

## 2018-08-30 DIAGNOSIS — I10 Essential (primary) hypertension: Secondary | ICD-10-CM | POA: Diagnosis not present

## 2018-08-30 DIAGNOSIS — R079 Chest pain, unspecified: Secondary | ICD-10-CM | POA: Diagnosis not present

## 2018-08-30 NOTE — Telephone Encounter (Signed)
Spoke with [name] and reviewed below MRI results/fim

## 2018-08-30 NOTE — Telephone Encounter (Signed)
-----   Message from Britt Bottom, MD sent at 08/29/2018  5:34 PM EDT ----- Please let her know the MRI of the brain did not show any new lesions.

## 2018-09-12 DIAGNOSIS — G35 Multiple sclerosis: Secondary | ICD-10-CM | POA: Diagnosis not present

## 2018-10-10 DIAGNOSIS — G35 Multiple sclerosis: Secondary | ICD-10-CM | POA: Diagnosis not present

## 2018-10-31 ENCOUNTER — Encounter: Payer: Self-pay | Admitting: *Deleted

## 2018-11-29 DIAGNOSIS — Z6833 Body mass index (BMI) 33.0-33.9, adult: Secondary | ICD-10-CM | POA: Diagnosis not present

## 2018-11-29 DIAGNOSIS — Z01419 Encounter for gynecological examination (general) (routine) without abnormal findings: Secondary | ICD-10-CM | POA: Diagnosis not present

## 2018-12-11 ENCOUNTER — Encounter: Payer: Self-pay | Admitting: Family Medicine

## 2018-12-11 ENCOUNTER — Ambulatory Visit: Payer: 59 | Admitting: Family Medicine

## 2018-12-11 VITALS — BP 138/84 | HR 84 | Temp 98.3°F | Ht 64.5 in | Wt 198.8 lb

## 2018-12-11 DIAGNOSIS — R350 Frequency of micturition: Secondary | ICD-10-CM | POA: Diagnosis not present

## 2018-12-11 DIAGNOSIS — I10 Essential (primary) hypertension: Secondary | ICD-10-CM

## 2018-12-11 DIAGNOSIS — G35 Multiple sclerosis: Secondary | ICD-10-CM | POA: Diagnosis not present

## 2018-12-11 DIAGNOSIS — F418 Other specified anxiety disorders: Secondary | ICD-10-CM

## 2018-12-11 DIAGNOSIS — D729 Disorder of white blood cells, unspecified: Secondary | ICD-10-CM

## 2018-12-11 DIAGNOSIS — Z6833 Body mass index (BMI) 33.0-33.9, adult: Secondary | ICD-10-CM

## 2018-12-11 LAB — CBC WITH DIFFERENTIAL/PLATELET
Basophils Absolute: 0.1 10*3/uL (ref 0.0–0.1)
Basophils Relative: 0.5 % (ref 0.0–3.0)
EOS ABS: 0.3 10*3/uL (ref 0.0–0.7)
EOS PCT: 2.7 % (ref 0.0–5.0)
HCT: 37.8 % (ref 36.0–46.0)
HEMOGLOBIN: 12.5 g/dL (ref 12.0–15.0)
LYMPHS ABS: 6.1 10*3/uL — AB (ref 0.7–4.0)
Lymphocytes Relative: 54.4 % — ABNORMAL HIGH (ref 12.0–46.0)
MCHC: 33 g/dL (ref 30.0–36.0)
MCV: 85.5 fl (ref 78.0–100.0)
MONO ABS: 1.1 10*3/uL — AB (ref 0.1–1.0)
Monocytes Relative: 10.2 % (ref 3.0–12.0)
NEUTROS PCT: 32.2 % — AB (ref 43.0–77.0)
Neutro Abs: 3.6 10*3/uL (ref 1.4–7.7)
Platelets: 330 10*3/uL (ref 150.0–400.0)
RBC: 4.42 Mil/uL (ref 3.87–5.11)
RDW: 16.2 % — ABNORMAL HIGH (ref 11.5–15.5)
WBC: 11.3 10*3/uL — ABNORMAL HIGH (ref 4.0–10.5)

## 2018-12-11 LAB — COMPREHENSIVE METABOLIC PANEL
ALBUMIN: 4.4 g/dL (ref 3.5–5.2)
ALK PHOS: 68 U/L (ref 39–117)
ALT: 18 U/L (ref 0–35)
AST: 13 U/L (ref 0–37)
BUN: 11 mg/dL (ref 6–23)
CHLORIDE: 102 meq/L (ref 96–112)
CO2: 27 mEq/L (ref 19–32)
CREATININE: 0.83 mg/dL (ref 0.40–1.20)
Calcium: 9.7 mg/dL (ref 8.4–10.5)
GFR: 96.38 mL/min (ref >60.00)
Glucose, Bld: 99 mg/dL (ref 70–99)
POTASSIUM: 4 meq/L (ref 3.5–5.1)
SODIUM: 137 meq/L (ref 135–145)
TOTAL PROTEIN: 7.6 g/dL (ref 6.0–8.3)
Total Bilirubin: 0.4 mg/dL (ref 0.2–1.2)

## 2018-12-11 LAB — LIPID PANEL
CHOLESTEROL: 157 mg/dL (ref 0–200)
HDL: 54.8 mg/dL (ref >39.00)
LDL CALC: 88 mg/dL (ref 0–99)
NonHDL: 102.52
Total CHOL/HDL Ratio: 3
Triglycerides: 71 mg/dL (ref 0.0–149.0)
VLDL: 14.2 mg/dL (ref 0.0–40.0)

## 2018-12-11 MED ORDER — TRIAMTERENE-HCTZ 37.5-25 MG PO TABS: 1.0000 | ORAL_TABLET | Freq: Every day | ORAL | 3 refills | Status: DC

## 2018-12-11 NOTE — Patient Instructions (Addendum)
Health Maintenance Due  Topic Date Due  . PAP SMEAR-we need to get records to update our charts. Sign release of information at the check out desk for last pap smear from Dr. Matthew Saras  08/22/2017   Refilled blood pressure medicine. No changes planned today  Please stop by lab before you go If you do not have mychart- we will call you about results within 5 business days of [place] receiving them.  If you have mychart- we will send your results within 3 business days of [place] receiving them.  If abnormal or we want to clarify a result, we will call or mychart you to make sure you receive the message.  If you have questions or concerns or don't hear within 5-7 days, please send [place] a message or call us.

## 2018-12-11 NOTE — Progress Notes (Signed)
Phone [phone]   Subjective:  [name] is a 33 y.o. year old very pleasant female patient who presents for/with See problem oriented charting ROS- some fatigue. No fever or chills. No chest pain or shortness of breath.    BMI monitoring- elevated BMI noted: Body mass index is 33.6 kg/m. Encouraged need for healthy eating, regular exercise, weight loss.  She feels she does reasonably well on food choices but wants to work on exercise BMI Metric Follow Up - 12/11/18 0951      BMI Metric Follow Up-Please document annually   BMI Metric Follow Up  Education provided      Past Medical History-  Patient Active Problem List   Diagnosis Date Noted  . Multiple sclerosis ([place]) 04/09/2016    Priority: High  . Essential hypertension 04/09/2016    Priority: Medium  . Migraine 03/29/2016    Priority: Medium  . High risk medication use 04/09/2016    Priority: Low  . Allergic rhinitis due to pollen 02/06/2010    Priority: Low  . Depression with anxiety 08/25/2018  . Leiomyoma 02/21/2018  . Abnormal brain MRI 04/16/2016  . Numbness 04/09/2016  . Urinary frequency 04/09/2016  . Other fatigue 04/09/2016    Medications- reviewed and updated Current Outpatient Medications  Medication Sig Dispense Refill  . cetirizine (ZYRTEC) 10 MG tablet Take 10 mg by mouth daily as needed for allergies.    Marland Kitchen escitalopram (LEXAPRO) 10 MG tablet Take 1 tablet (10 mg total) by mouth daily. 90 tablet 3  . fluticasone (FLONASE) 50 MCG/ACT nasal spray TWO SPRAYS EACH NOSTRIL ONCE A DAY (Patient taking differently: TWO SPRAYS EACH NOSTRIL ONCE A DAY AS NEEDED) 1 g 0  . ibuprofen (ADVIL,MOTRIN) 800 MG tablet Take 1 tablet (800 mg total) by mouth every 8 (eight) hours as needed (mild pain). 30 tablet 0  . natalizumab (TYSABRI) 300 MG/15ML injection Inject 300 mg into the vein every 30 (thirty) days.    Marland Kitchen oxybutynin (DITROPAN XL) 10 MG 24 hr tablet Take 1 tablet (10 mg total) by mouth at bedtime. 30  tablet 11  . phentermine 37.5 MG capsule Take 1 capsule (37.5 mg total) by mouth daily. 30 capsule 5  . rizatriptan (MAXALT-MLT) 10 MG disintegrating tablet Take 1 tablet (10 mg total) by mouth as needed for migraine. May repeat in 2 hours if needed 9 tablet 3  . triamterene-hydrochlorothiazide (MAXZIDE-25) 37.5-25 MG tablet Take 1 tablet by mouth daily. 90 tablet 3   No current facility-administered medications for this visit.    Facility-Administered Medications Ordered in Other Visits  Medication Dose Route Frequency Provider Last Rate Last Dose  . gadoteridol (PROHANCE) injection 13 mL  13 mL Intravenous Once PRN Melvenia Beam, MD         Objective:  BP 138/84 (BP Location: Left Arm, Patient Position: Sitting, Cuff Size: Normal)   Pulse 84   Temp 98.3 F (36.8 C) (Oral)   Ht 5' 4.5" (1.638 m)   Wt 198 lb 12.8 oz (90.2 kg)   SpO2 98%   BMI 33.60 kg/m  Gen: NAD, resting comfortably CV: RRR no murmurs rubs or gallops Lungs: CTAB no crackles, wheeze, rhonchi Abdomen: soft/nontender/nondistended/normal bowel sounds.  Ext: no edema Skin: warm, dry    Assessment and Plan    #Hypertension S: Compliant with triamterene hydrochlorothiazide 37.5-25 mg. A/P: Controlled but high normal today-we discussed the role of healthy eating and regular exercise to help further lower these numbers-and related to obesity discussion  also as above -Of note was placed on phentermine by Dr. Felecia Shelling to help with weight loss which can raise blood pressure some.  She has lost 5 pounds on this- states doesn't take everyday.   % Multiple sclerosis S: Patient is doing reasonably well on Tysabri.  Continues to follow with Dr. Felecia Shelling. A/P:  Stable. Continue current medications.    Has follow up on Wednesday for next infusion.   Urinary frequency S: Patient was started on oxybutynin by Dr. Jerene Dilling has found this really helpful  A/P: Stable problem. Continue current medicine  Depression with anxiety S:  Patient was started on Lexapro 10 mg by Dr. Felecia Shelling. She states primarily due to anxiety issues- she states has done better on medication A/P: PHQ9 of 0 today suggestive of full remission of depression aspect. She states anxiety is controlled    Son in 1st grade- doing well and enjoys it. He is in AG classes and also in a math group. Reading at 5th grade level. At [employer].   We discussed 6 to 64-month follow-up  Lab/Order associations: Essential hypertension - Plan: CBC with Differential/Platelet, Comprehensive metabolic panel, Lipid panel  Urinary frequency  Depression with anxiety  BMI 33.0-33.9,adult  Meds ordered this encounter  Medications  . triamterene-hydrochlorothiazide (MAXZIDE-25) 37.5-25 MG tablet    Sig: Take 1 tablet by mouth daily.    Dispense:  90 tablet    Refill:  3   Return precautions advised.  Garret Reddish, MD

## 2018-12-11 NOTE — Assessment & Plan Note (Signed)
S: Patient was started on Lexapro 10 mg by Dr. Felecia Shelling. She states primarily due to anxiety issues- she states has done better on medication A/P: PHQ9 of 0 today suggestive of full remission of depression aspect. She states anxiety is controlled

## 2018-12-11 NOTE — Assessment & Plan Note (Signed)
S: Patient was started on oxybutynin by Dr. Jerene Dilling has found this really helpful  A/P: Stable problem. Continue current medicine

## 2018-12-13 ENCOUNTER — Other Ambulatory Visit: Payer: Self-pay

## 2018-12-13 DIAGNOSIS — D729 Disorder of white blood cells, unspecified: Secondary | ICD-10-CM

## 2018-12-13 NOTE — Addendum Note (Signed)
Addended by: Gwenyth Ober R on: 12/13/2018 02:58 PM   Modules accepted: Orders

## 2018-12-14 DIAGNOSIS — G35 Multiple sclerosis: Secondary | ICD-10-CM | POA: Diagnosis not present

## 2018-12-15 ENCOUNTER — Encounter: Payer: Self-pay | Admitting: Family Medicine

## 2018-12-18 ENCOUNTER — Other Ambulatory Visit: Payer: Self-pay | Admitting: Family Medicine

## 2018-12-18 NOTE — Telephone Encounter (Signed)
See note

## 2018-12-18 NOTE — Telephone Encounter (Signed)
Copied from [place] [phone]. Topic: Quick Communication - Rx Refill/Question >> Dec 18, 2018  3:09 PM [place], [place] D wrote: Medication: cetirizine (ZYRTEC) 10 MG tablet / Pt stated she thought Dr. Yong Channel filled at her last OV on 12/11/18  Has the patient contacted their pharmacy? Yes.   (Agent: If no, request that the patient contact the pharmacy for the refill.) (Agent: If yes, when and what did the pharmacy advise?)  Preferred Pharmacy (with phone number or street name): [id number] [address], [place] [phone] (Phone) [phone] (Fax)  Agent: Please be advised that RX refills may take up to 3 business days. We ask that you follow-up with your pharmacy.

## 2018-12-18 NOTE — Telephone Encounter (Signed)
Medication: cetirizine (ZYRTEC) 10 MG tablet / Pt stated she thought Dr. Yong Channel filled at her last OV on 12/11/18; will route to office for final disposition.  Requested medication (s) are due for refill today: ?  Requested medication (s) are on the active medication list: yes  Last refill:  ?  Future visit scheduled: no  Notes to clinic:  Pt seen in office 12/11/2018       Requested Prescriptions  Pending Prescriptions Disp Refills  . cetirizine (ZYRTEC) 10 MG tablet      Sig: Take 1 tablet (10 mg total) by mouth daily as needed for allergies.     There is no refill protocol information for this order

## 2018-12-19 MED ORDER — CETIRIZINE HCL 10 MG PO TABS: 10.0000 mg | ORAL_TABLET | Freq: Every day | ORAL | 3 refills | Status: DC | PRN

## 2019-01-11 DIAGNOSIS — G35 Multiple sclerosis: Secondary | ICD-10-CM | POA: Diagnosis not present

## 2019-02-21 DIAGNOSIS — G35 Multiple sclerosis: Secondary | ICD-10-CM | POA: Diagnosis not present

## 2019-03-11 ENCOUNTER — Other Ambulatory Visit: Payer: Self-pay | Admitting: Neurology

## 2019-03-21 DIAGNOSIS — G35 Multiple sclerosis: Secondary | ICD-10-CM | POA: Diagnosis not present

## 2019-04-09 ENCOUNTER — Other Ambulatory Visit: Payer: Self-pay | Admitting: Neurology

## 2019-04-12 ENCOUNTER — Other Ambulatory Visit: Payer: Self-pay | Admitting: Neurology

## 2019-04-23 ENCOUNTER — Telehealth: Payer: Self-pay | Admitting: *Deleted

## 2019-04-23 NOTE — Telephone Encounter (Signed)
Called pt back. Scheduled virtual visit for 04/26/19 at 1130am with Dr. Felecia Shelling. Emailed her link at [email or web address]. Confirmed she received while on the phone. Updated med list, pharmacy, allergies.   Last Tysabri infusion 04/18/19.  Next infusion scheduled for 05/16/19

## 2019-04-23 NOTE — Telephone Encounter (Signed)
Received Tysabri touch re-auth questionnaire. I reviewed pt chart and she was last seen 08/2018. She has no pending f/u. Dr. Felecia Shelling wanted to see her back in 6 months.  I called pt. She has small balance on account per Gay[place] P.  I transferred her to billing to handle this prior to making f/u.

## 2019-04-24 NOTE — Telephone Encounter (Signed)
Faxed completed/signed Tysabri pt status report and reauth questionnaire to [facility] at [phone]. Received confirmation.  

## 2019-04-24 NOTE — Telephone Encounter (Signed)
Received fax notification from [facility] prescribing program that pt re-auth for Tysabri from 04-24-19 to 11-22-19. Enrollment number: [id number]. Account: [employer]. Site auth number: [id number].

## 2019-04-26 ENCOUNTER — Other Ambulatory Visit: Payer: Self-pay

## 2019-04-26 ENCOUNTER — Ambulatory Visit (INDEPENDENT_AMBULATORY_CARE_PROVIDER_SITE_OTHER): Payer: 59 | Admitting: Neurology

## 2019-04-26 ENCOUNTER — Encounter: Payer: Self-pay | Admitting: Neurology

## 2019-04-26 DIAGNOSIS — G43109 Migraine with aura, not intractable, without status migrainosus: Secondary | ICD-10-CM

## 2019-04-26 DIAGNOSIS — R200 Anesthesia of skin: Secondary | ICD-10-CM | POA: Diagnosis not present

## 2019-04-26 DIAGNOSIS — Z79899 Other long term (current) drug therapy: Secondary | ICD-10-CM | POA: Diagnosis not present

## 2019-04-26 DIAGNOSIS — E559 Vitamin D deficiency, unspecified: Secondary | ICD-10-CM

## 2019-04-26 DIAGNOSIS — R350 Frequency of micturition: Secondary | ICD-10-CM

## 2019-04-26 DIAGNOSIS — G35 Multiple sclerosis: Secondary | ICD-10-CM

## 2019-04-26 DIAGNOSIS — R5383 Other fatigue: Secondary | ICD-10-CM

## 2019-04-26 MED ORDER — PHENTERMINE HCL 37.5 MG PO CAPS: 37.5000 mg | ORAL_CAPSULE | Freq: Every day | ORAL | 5 refills | Status: DC

## 2019-04-26 MED ORDER — BACLOFEN 10 MG PO TABS: 10.0000 mg | ORAL_TABLET | Freq: Three times a day (TID) | ORAL | 2 refills | Status: DC

## 2019-04-26 NOTE — Progress Notes (Signed)
[facility]  PATIENT: [name] DOB: [birth year]  REFERRING DOCTOR OR PCP:  Garret Reddish SOURCE: Patient, notes from [facility], lab and other results from [facility], MRI images personally reviewed on PACS  _________________________________   HISTORICAL  CHIEF COMPLAINT:  Chief Complaint  Patient presents with   Multiple Sclerosis    HISTORY OF PRESENT ILLNESS:  [name] is a 33 y.o. woman with MS.       Update 04/26/2019: Virtual Visit via Video Note I connected with [name] on 04/26/19 at 11:30 AM EDT by a video enabled telemedicine application and verified that I am speaking with the correct person.  I discussed the limitations of evaluation and management by telemedicine and the availability of in person appointments. The patient expressed understanding and agreed to proceed.  History of Present Illness: She is on Tysabri and her infusions are doing well.  Next infusion will be 05/16/2019.  She has had nausea a few times with the recent infusionShe feels her MS hahs been mostly stable.  No exacerbations.   She does note more cramps in her feet/legs.   The foot cramps occur mostly during the day.   Gait and balance are fine.  She will have some tingling at times in her feet, but usually with the cramps.  She has urinary frequency, helped by oxybutynin,   Vision is fine ans she has seen ophtho this year.   Her fatigue is mild some days and more severe some days.   Phentermine has helped fatigue and also helped her lose 15 pounds.     Migraines have done well the past 6 months with no severe headache.  She still gets some milder headaches 2-3 times a week.  She sometimes takes Tylenol with benefit and Ibuprofen 800 mg helps if Tylenol does not.   She is sleeping well most night.  Observations/Objective:  She is a well-developed well-nourished woman in no acute distress.  The head is normocephalic and atraumatic.  Sclera are anicteric.   Visible skin appears normal.  The neck has a good range of motion.  Pharynx and tongue have normal appearance.  She is alert and fully oriented with fluent speech and good attention, knowledge and memory.  Extraocular muscles are intact.  Facial strength is normal.  Palatal elevation and tongue protrusion are midline.  She appears to have normal strength in the arms.  Rapid alternating movements and finger-nose-finger are performed well.   Assessment and Plan:  1. Multiple sclerosis ([place])   2. High risk medication use   3. Migraine with aura and without status migrainosus, not intractable   4. Numbness   5. Other fatigue   6. Urinary frequency   7. Vitamin D deficiency    1.  Continue Tysabri.  We need to check a JCV antibody titer and CBC when she comes in for her next infusion.  She has been JCV antibody negative in the past. 2.  Stay active and exercise as tolerated. 3.   Renew phentermine for fatigue and weight loss. 4.   Add baclofen as it may help her spasms.  She can take as needed. 5.   She will return to see me in 6 months or sooner if there are new or worsening neurologic symptoms.   Follow Up Instructions: I discussed the assessment and treatment plan with the patient. The patient was provided an opportunity to ask questions and all were answered. The patient agreed with the plan and demonstrated an  understanding of the instructions.    The patient was advised to call back or seek an in-person evaluation if the symptoms worsen or if the condition fails to improve as anticipated.  I provided 27 minutes of non-face-to-face time during this encounter. _________________________________________________ From previous visits: Update 08/25/2018: She feels her MS has done well for the most part but she has noted some depression and headache.    She is on Tysabri and infusion have gone well.   She has a JCVAb titer of 0.29 last check and we will recheck again today.   Gait is fine.   No  new numbness or weakness.   Bladder is doing well.    Vision is stable.    She has felt a nervousness the last 4 months with anxiety.  At times it is more intense for 10-15 minutes with heart pounding.      She notes mild depression.    She has some stress but there is little change from last year.   She notes mild fatigue, helped by phentermine.   She also lost 7 pounds.      She gets migraine headaches about twice a month.  These usually last one day.  She has not tried triptans in the past.  Update 02/17/2018: Her Tysabri infusions are doing well.   She gets some cramping in the left leg and the right forearm/hand at times.   She is JCV Ab negative last tested 04/2017  She has to walk a long distance from the car to her work and she cramps up more during the walk.    Her balance is ok.    Strength is mostly fine unless she walks longer distances.    She denies constant numbness or tingling.  However, if she sits a long time, her left leg goes numb and she needs to get up to moves.   Her bladder function is much better on ditropan.  Vision is stable.   She wears glasses.  No MS related visual issues.    She has low fatigue in the morning but she struggles to get out of bed.  Fatigue is worse in the afternoon.   She is off phentermine until after gyn surgery.  She gets 5-6 hours sleep most nights.  Mood is fine.   She denies cognitive issues.   She gets some headaches usually in the forehead in the afternoon.   No N/V.   No photo/phonophobia.     From 04/18/2017: She started Tysabri June 2017 and is tolerating it very well.  She denies any exacerbations and old symptoms have mostly resolved.    Strength/sensation/gait: Her gait and balance are doing well.    She denies weakness but she gets cramps in her leg muscles, usually in the afternoons.   She exercises (works out in gym) daily in the mornings.     She denies arm numbness/tingling now.    Vision: She does not note any difficulty with visual acuity  now or in the past. There is no diplopia. There is no eye pain.  . She does wear glasses.  Bladder/bowel: Bladder urgency and nocturia is better on Ditropan.   It also helps her sleep since she no longer wakes up for nocturia   Fatigue/sleep: She rarely has fatigue in the mornings but will have some afternoons.  Phentermine helps fatigue.   SHe is now sleeping better.   She does not need trazodone now  Mood/cognition: She denies any depression or  anxiety. She has not noted any significant changes with cognitive function.   MS History:   In May, 2017,  she had the onset of right arm numbness and weakness that developed over one day.       She went to the emergency room and an MRI of the brain on 03/28/2016 showed multiple T2/FLAIR hyperintense foci consistent with multiple sclerosis. She was admitted and received 5 days of IV steroids. While in the hospital she had a contrasted MRI of the brain that showed that 2 of those lesions enhanced consistent with more acute onset. Additionally, she had 2 lesions in the cervical spine, both enhancing. The one at C6 was towards the right and could explain her symptoms. Another one was adjacent to C3-C4, posteriorly. She also had a lumbar puncture. The cerebrospinal fluid shows that there were 9 oligoclonal bands in the CSF which is also consistent with multiple sclerosis. Due to the aggressiveness of her MS, she was started on Tysabri. She is JCV antibody negative (0.25).   In retrospect, in 2011, she had headaches and tremors and had an MRI of the brain. It showed a single T2/FLAIR hyperintense focus in the right periventricular white matter of the parietal lobe.   Follow-up MRI in 2012 did not show any new lesions. The symptoms improved and she had no other symptoms until 2017  REVIEW OF SYSTEMS: Review of Systems  Constitutional: Positive for malaise/fatigue.  HENT: Negative for congestion and hearing loss.   Eyes: Negative.  Negative for blurred vision,  double vision, photophobia and pain.  Respiratory: Negative for cough.   Cardiovascular: Negative for chest pain.  Gastrointestinal: Negative for abdominal pain, nausea and vomiting.  Genitourinary: Positive for frequency and urgency. Negative for dysuria.  Musculoskeletal: Negative for back pain and neck pain.  Skin: Negative for itching and rash.  Neurological: Negative.   Endo/Heme/Allergies: Negative.   Psychiatric/Behavioral: Negative for depression. The patient has insomnia.     ALLERGIES: No Known Allergies  HOME MEDICATIONS:  Current Outpatient Medications:    baclofen (LIORESAL) 10 MG tablet, Take 1 tablet (10 mg total) by mouth 3 (three) times daily., Disp: 90 each, Rfl: 2   cetirizine (ZYRTEC) 10 MG tablet, Take 1 tablet (10 mg total) by mouth daily as needed for allergies., Disp: 90 tablet, Rfl: 3   escitalopram (LEXAPRO) 10 MG tablet, Take 1 tablet (10 mg total) by mouth daily., Disp: 90 tablet, Rfl: 3   fluticasone (FLONASE) 50 MCG/ACT nasal spray, TWO SPRAYS EACH NOSTRIL ONCE A DAY (Patient taking differently: TWO SPRAYS EACH NOSTRIL ONCE A DAY AS NEEDED), Disp: 1 g, Rfl: 0   ibuprofen (ADVIL,MOTRIN) 800 MG tablet, Take 1 tablet (800 mg total) by mouth every 8 (eight) hours as needed (mild pain)., Disp: 30 tablet, Rfl: 0   natalizumab (TYSABRI) 300 MG/15ML injection, Inject 300 mg into the vein every 30 (thirty) days., Disp: , Rfl:    oxybutynin (DITROPAN-XL) 10 MG 24 hr tablet, TAKE 1 TABLET BY MOUTH EVERYDAY AT BEDTIME, Disp: 90 tablet, Rfl: 3   phentermine 37.5 MG capsule, Take 1 capsule (37.5 mg total) by mouth daily., Disp: 30 capsule, Rfl: 5   rizatriptan (MAXALT-MLT) 10 MG disintegrating tablet, Take 1 tablet (10 mg total) by mouth as needed for migraine. May repeat in 2 hours if needed, Disp: 9 tablet, Rfl: 3   triamterene-hydrochlorothiazide (MAXZIDE-25) 37.5-25 MG tablet, Take 1 tablet by mouth daily., Disp: 90 tablet, Rfl: 3 No current  facility-administered medications for this visit.  Facility-Administered Medications Ordered in Other Visits:    gadoteridol (PROHANCE) injection 13 mL, 13 mL, Intravenous, Once PRN, Melvenia Beam, MD  PAST MEDICAL HISTORY: Past Medical History:  Diagnosis Date   Allergic rhinitis due to pollen 02/06/2010   Esophageal reflux 11/29/2008   HEADACHE, CLUSTER, CHRONIC 02/06/2010   patient reports migraines, not cluster headaches from problem list   Hypertension    Multiple sclerosis ([place])    Neuromuscular disorder ([place])    multiple sclerosis    PAST SURGICAL HISTORY: Past Surgical History:  Procedure Laterality Date   CESAREAN SECTION  06/29/2012   Procedure: CESAREAN SECTION;  Surgeon: Allena Katz, MD;  Location: [facility];  Service: Obstetrics;  Laterality: N/A;   IUD REMOVAL  2018   MYOMECTOMY N/A 02/21/2018   Procedure: MYOMECTOMY, ABDOMINAL;  Surgeon: Molli Posey, MD;  Location: [facility];  Service: Gynecology;  Laterality: N/A;    FAMILY HISTORY: Family History  Problem Relation Age of Onset   Lung cancer Maternal Grandfather    Hypertension Mother    Hypertension Father    Mental illness Maternal Grandmother    Diabetes Maternal Grandmother    Glaucoma Paternal Grandmother    Hypertension Paternal Grandfather     SOCIAL HISTORY:  Social History   Socioeconomic History   Marital status: Single    Spouse name: Not on file   Number of children: Not on file   Years of education: Not on file   Highest education level: Not on file  Occupational History   Not on file  Social Needs   Financial resource strain: Not on file   Food insecurity    Worry: Not on file    Inability: Not on file   Transportation needs    Medical: Not on file    Non-medical: Not on file  Tobacco Use   Smoking status: Never Smoker   Smokeless tobacco: Never Used  Substance and Sexual Activity   Alcohol use: Yes    Alcohol/week: 0.0 - 1.0 standard drinks     Comment: occ   Drug use: No   Sexual activity: Yes    Birth control/protection: I.U.D.  Lifestyle   Physical activity    Days per week: Not on file    Minutes per session: Not on file   Stress: Not on file  Relationships   Social connections    Talks on phone: Not on file    Gets together: Not on file    Attends religious service: Not on file    Active member of club or organization: Not on file    Attends meetings of clubs or organizations: Not on file    Relationship status: Not on file   Intimate partner violence    Fear of current or ex partner: Not on file    Emotionally abused: Not on file    Physically abused: Not on file    Forced sexual activity: Not on file  Other Topics Concern   Not on file  Social History Narrative   Lives at home with son (born 2013)  and son's father.       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    Prior  Advertising account executive (Pharmacist, community) Garment/textile technologist.        Wears glasses.   Caffeine none.     PHYSICAL EXAM  There were no vitals filed for this visit.  There is no height or weight on file to calculate BMI.  General: The patient is well-developed and well-nourished and in no acute distress  Extremities:    No rash or edema.  Neurologic Exam  Mental status: The patient is alert and oriented x 3 at the time of the examination. The patient has apparent normal recent and remote memory, with an apparently normal attention span and concentration ability.   Speech is normal.  Cranial nerves: Extraocular movements are full.  Facial strength and sensation was normal.  Trapezius strength was normal.. No obvious hearing deficits are noted.  Motor: Muscle bulk, tone and strength are normal.  Sensory: Sensory testing is intact to touch and vibration sensation in the arms and legs.   Coordination: Cerebellar testing reveals good finger-nose-finger and heel-to-shin bilaterally.  Gait and station: The  station is normal and the gait is normal.  Her tandem gait is slightly wide..   Romberg is negative.    Reflexes: Deep tendon reflexes are normal and symmetric bilaterally     DIAGNOSTIC DATA (LABS, IMAGING, TESTING) - I reviewed patient records, labs, notes, testing and imaging myself where available.  Lab Results  Component Value Date   WBC 11.3 (H) 12/11/2018   HGB 12.5 12/11/2018   HCT 37.8 12/11/2018   MCV 85.5 12/11/2018   PLT 330.0 12/11/2018      Component Value Date/Time   NA 137 12/11/2018 0945   K 4.0 12/11/2018 0945   CL 102 12/11/2018 0945   CO2 27 12/11/2018 0945   GLUCOSE 99 12/11/2018 0945   BUN 11 12/11/2018 0945   CREATININE 0.83 12/11/2018 0945   CALCIUM 9.7 12/11/2018 0945   PROT 7.6 12/11/2018 0945   ALBUMIN 4.4 12/11/2018 0945   ALBUMIN 3,800 03/31/2016 1024   AST 13 12/11/2018 0945   ALT 18 12/11/2018 0945   ALKPHOS 68 12/11/2018 0945   BILITOT 0.4 12/11/2018 0945   GFRNONAA >60 02/15/2018 0910   GFRAA >60 02/15/2018 0910    Lab Results  Component Value Date   TSH 0.70 11/29/2008     Karrington Studnicka A. Felecia Shelling, MD, PhD 1/76/1607, 37:10 PM Certified in Neurology, Clinical Neurophysiology, Sleep Medicine, Pain Medicine and Neuroimaging  [facility] [address] [place], [place] [address] [phone]

## 2019-05-16 ENCOUNTER — Telehealth: Payer: Self-pay | Admitting: *Deleted

## 2019-05-16 ENCOUNTER — Encounter: Payer: Self-pay | Admitting: Neurology

## 2019-05-16 NOTE — Telephone Encounter (Signed)
Placed JCV lab in [facility] lock box for routine lab pick up. Results pending. 

## 2019-05-24 ENCOUNTER — Telehealth: Payer: Self-pay | Admitting: Neurology

## 2019-05-24 NOTE — Telephone Encounter (Signed)
JC viral antibody is negative, index value of 0.23

## 2019-05-24 NOTE — Telephone Encounter (Signed)
Called and requested results be faxed on 05/22/19 but never received. I called [facility] again today and requested results be faxed to our office.

## 2019-05-28 NOTE — Telephone Encounter (Signed)
JCV ab drawn on 05/16/19 indeterminate, index: 0.23. Inhibition assay: negative

## 2019-05-31 NOTE — Addendum Note (Signed)
Addended by: Francis Dowse T on: 05/31/2019 05:22 PM   Modules accepted: Orders

## 2019-06-01 ENCOUNTER — Other Ambulatory Visit (INDEPENDENT_AMBULATORY_CARE_PROVIDER_SITE_OTHER): Payer: 59

## 2019-06-01 ENCOUNTER — Other Ambulatory Visit: Payer: Self-pay

## 2019-06-01 DIAGNOSIS — D729 Disorder of white blood cells, unspecified: Secondary | ICD-10-CM | POA: Diagnosis not present

## 2019-06-01 LAB — CBC WITH DIFFERENTIAL/PLATELET
Basophils Absolute: 0 10*3/uL (ref 0.0–0.1)
Basophils Relative: 0.3 % (ref 0.0–3.0)
Eosinophils Absolute: 0.3 10*3/uL (ref 0.0–0.7)
Eosinophils Relative: 2.7 % (ref 0.0–5.0)
HCT: 37.3 % (ref 36.0–46.0)
Hemoglobin: 12.5 g/dL (ref 12.0–15.0)
Lymphocytes Relative: 51.6 % — ABNORMAL HIGH (ref 12.0–46.0)
Lymphs Abs: 6.4 10*3/uL — ABNORMAL HIGH (ref 0.7–4.0)
MCHC: 33.4 g/dL (ref 30.0–36.0)
MCV: 83.7 fl (ref 78.0–100.0)
Monocytes Absolute: 1.3 10*3/uL — ABNORMAL HIGH (ref 0.1–1.0)
Monocytes Relative: 10.2 % (ref 3.0–12.0)
Neutro Abs: 4.3 10*3/uL (ref 1.4–7.7)
Neutrophils Relative %: 35.2 % — ABNORMAL LOW (ref 43.0–77.0)
Platelets: 328 10*3/uL (ref 150.0–400.0)
RBC: 4.46 Mil/uL (ref 3.87–5.11)
RDW: 14.8 % (ref 11.5–15.5)
WBC: 12.3 10*3/uL — ABNORMAL HIGH (ref 4.0–10.5)

## 2019-07-18 ENCOUNTER — Other Ambulatory Visit: Payer: Self-pay | Admitting: Neurology

## 2019-10-05 ENCOUNTER — Encounter: Payer: Self-pay | Admitting: Family Medicine

## 2019-10-05 ENCOUNTER — Ambulatory Visit (INDEPENDENT_AMBULATORY_CARE_PROVIDER_SITE_OTHER): Payer: 59 | Admitting: Family Medicine

## 2019-10-05 VITALS — BP 127/76 | Ht 64.5 in | Wt 198.0 lb

## 2019-10-05 DIAGNOSIS — I10 Essential (primary) hypertension: Secondary | ICD-10-CM

## 2019-10-05 DIAGNOSIS — G43109 Migraine with aura, not intractable, without status migrainosus: Secondary | ICD-10-CM

## 2019-10-05 DIAGNOSIS — D72820 Lymphocytosis (symptomatic): Secondary | ICD-10-CM | POA: Diagnosis not present

## 2019-10-05 DIAGNOSIS — F419 Anxiety disorder, unspecified: Secondary | ICD-10-CM

## 2019-10-05 MED ORDER — BUSPIRONE HCL 5 MG PO TABS: 5.0000 mg | ORAL_TABLET | Freq: Three times a day (TID) | ORAL | 2 refills | Status: DC | PRN

## 2019-10-05 MED ORDER — RIZATRIPTAN BENZOATE 10 MG PO TBDP: 10.0000 mg | ORAL_TABLET | ORAL | 3 refills | Status: DC | PRN

## 2019-10-05 MED ORDER — TRIAMTERENE-HCTZ 37.5-25 MG PO TABS: 1.0000 | ORAL_TABLET | Freq: Every day | ORAL | 3 refills | Status: DC

## 2019-10-05 NOTE — Patient Instructions (Addendum)
Health Maintenance Due  Topic Date Due  . PAP SMEAR-Modifier  Has app 12/07/2019 with GYN  10/20/2019   Recommended follow up: Return in about 6 months (around 04/03/2020) for physical or sooner if needed.

## 2019-10-05 NOTE — Progress Notes (Signed)
Phone [phone] Virtual visit via Video note   Subjective:  Chief complaint: Chief Complaint  Patient presents with  . Follow-up    wanted to know if labs needed to be checked.     This visit type was conducted due to national recommendations for restrictions regarding the COVID-19 Pandemic (e.g. social distancing).  This format is felt to be most appropriate for this patient at this time balancing risks to patient and risks to population by having him in for in person visit.  No physical exam was performed (except for noted visual exam or audio findings with Telehealth visits).    Our team/I connected with [name] at 11:00 AM EST by a video enabled telemedicine application (doxy.me or caregility through epic) and verified that I am speaking with the correct person using two identifiers.  Location patient: [facility] Location provider: [facility], office Persons participating in the virtual visit:  patient  Our team/I discussed the limitations of evaluation and management by telemedicine and the availability of in person appointments. In light of current covid-19 pandemic, patient also understands that we are trying to protect them by minimizing in office contact if at all possible.  The patient expressed consent for telemedicine visit and agreed to proceed. Patient understands insurance will be billed.   ROS- has had migraines lately. No chest pain or shortness of breath. No  blurry vision.    Past Medical History-  Patient Active Problem List   Diagnosis Date Noted  . Multiple sclerosis ([place]) 04/09/2016    Priority: High  . Anxiety 08/25/2018    Priority: Medium  . Essential hypertension 04/09/2016    Priority: Medium  . Migraine 03/29/2016    Priority: Medium  . High risk medication use 04/09/2016    Priority: Low  . Allergic rhinitis due to pollen 02/06/2010    Priority: Low  . Leiomyoma 02/21/2018  . Abnormal brain MRI 04/16/2016  . Numbness 04/09/2016  .  Urinary frequency 04/09/2016  . Other fatigue 04/09/2016    Medications- reviewed and updated Current Outpatient Medications  Medication Sig Dispense Refill  . baclofen (LIORESAL) 10 MG tablet TAKE 1 TABLET BY MOUTH THREE TIMES A DAY 270 tablet 1  . cetirizine (ZYRTEC) 10 MG tablet Take 1 tablet (10 mg total) by mouth daily as needed for allergies. 90 tablet 3  . fluticasone (FLONASE) 50 MCG/ACT nasal spray TWO SPRAYS EACH NOSTRIL ONCE A DAY (Patient taking differently: TWO SPRAYS EACH NOSTRIL ONCE A DAY AS NEEDED) 1 g 0  . ibuprofen (ADVIL,MOTRIN) 800 MG tablet Take 1 tablet (800 mg total) by mouth every 8 (eight) hours as needed (mild pain). 30 tablet 0  . natalizumab (TYSABRI) 300 MG/15ML injection Inject 300 mg into the vein every 30 (thirty) days.    Marland Kitchen oxybutynin (DITROPAN-XL) 10 MG 24 hr tablet TAKE 1 TABLET BY MOUTH EVERYDAY AT BEDTIME 90 tablet 3  . phentermine 37.5 MG capsule Take 1 capsule (37.5 mg total) by mouth daily. 30 capsule 5  . rizatriptan (MAXALT-MLT) 10 MG disintegrating tablet Take 1 tablet (10 mg total) by mouth as needed for migraine. May repeat in 2 hours if needed 9 tablet 3  . triamterene-hydrochlorothiazide (MAXZIDE-25) 37.5-25 MG tablet Take 1 tablet by mouth daily. 90 tablet 3  . busPIRone (BUSPAR) 5 MG tablet Take 1 tablet (5 mg total) by mouth 3 (three) times daily as needed. 60 tablet 2   No current facility-administered medications for this visit.    Facility-Administered Medications Ordered in  Other Visits  Medication Dose Route Frequency Provider Last Rate Last Dose  . gadoteridol (PROHANCE) injection 13 mL  13 mL Intravenous Once PRN Melvenia Beam, MD         Objective:  BP 127/76   Ht 5' 4.5" (1.638 m)   Wt 198 lb (89.8 kg)   BMI 33.46 kg/m  self reported vitals Gen: NAD, resting comfortably Lungs: nonlabored, normal respiratory rate  Skin: appears dry, no obvious rash    Assessment and Plan   #Social update- work going well and able to  work from home  #Lymphocytosis-patient with leukocytosis-predominantly elevated lymphocytes-slightly increasing over the last few checks for lymphocytes but leukocytosis overall is largely stable.  This may be related to her Tysabri.  I do not think levels are high enough that it would indicate further work-up at this time or discontinuing medication but I asked her to discuss with neurology Dr. Felecia Shelling.  I am happy to place a referral to hematology if needed.  She is getting a CBC with differential done next month with him so we opted to hold off on repeat at this time.   #Hypertension S: Compliant with triamterene hydrochlorothiazide 37.5-25 mg. Patient denies any issues. BP Readings from Last 3 Encounters:  10/05/19 127/76  12/11/18 138/84  02/23/18 (!) 108/58  A/P: recently checked when receiving her infusion and well controlled- continue current medicines.    - warned phentermine can raise BP- and should continue to monitor BP while on this.   % Multiple sclerosis S: Patient is doing reasonably well on Tysabri.  Continues to follow with Dr. Felecia Shelling. Has appointment with Dr. Felecia Shelling next month.  A/P: Stable. Continue current medications.     %Overactive bladder/urinary frequency- improved recently in 2020- using oxybutynin less. Thinks moving BP medicine to night was helpful   #Anxiety  S: Patient was started on Lexapro 10 mg by Dr. Felecia Shelling. She states primarily due to anxiety issues- she states has done better on medication.   Anxiety issues much higher when she was in person at work. Working from home has not needed this fortunately- takes prn.  A/P:  Reasonably well controlled but using SSRI only prn. I think she would be better served with a medicine like buspirone since she is doing so well lately- we opted to to try this. Removing lexapro/escitalopram from her list.     # Migraines- usually followed by Dr. Felecia Shelling S:slight increase in intensity lately. Takes maxalt every other day lately.    A/P: I refilled maxalt for her but encouraged her to use twice a week maximum- she sees Dr. Felecia Shelling next month and he can take rx back over if he would like.   -if has worsening headache pattern may be worth checking in sooner with neurology    Recommended follow up: Return in about 6 months (around 04/03/2020) for physical or sooner if needed. Future Appointments  Date Time Provider [place]  11/01/2019  4:00 PM Sater, Nanine Means, MD [facility] None    Lab/Order associations:   ICD-10-CM   1. Lymphocytosis  D72.820   2. Essential hypertension  I10   3. Migraine with aura and without status migrainosus, not intractable  G43.109   4. Anxiety  F41.9     Meds ordered this encounter  Medications  . busPIRone (BUSPAR) 5 MG tablet    Sig: Take 1 tablet (5 mg total) by mouth 3 (three) times daily as needed.    Dispense:  60 tablet  Refill:  2  . rizatriptan (MAXALT-MLT) 10 MG disintegrating tablet    Sig: Take 1 tablet (10 mg total) by mouth as needed for migraine. May repeat in 2 hours if needed    Dispense:  9 tablet    Refill:  3  . triamterene-hydrochlorothiazide (MAXZIDE-25) 37.5-25 MG tablet    Sig: Take 1 tablet by mouth daily.    Dispense:  90 tablet    Refill:  3    Return precautions advised.  Garret Reddish, MD

## 2019-10-05 NOTE — Assessment & Plan Note (Signed)
S: Patient was started on Lexapro 10 mg by Dr. Felecia Shelling. She states primarily due to anxiety issues- she states has done better on medication.   Anxiety issues much higher when she was in person at work. Working from home has not needed this fortunately- takes prn.  A/P:  Reasonably well controlled but using SSRI only prn. I think she would be better served with a medicine like buspirone since she is doing so well lately- we opted to to try this. Removing lexapro/escitalopram from her list.

## 2019-10-22 ENCOUNTER — Telehealth: Payer: Self-pay | Admitting: *Deleted

## 2019-10-22 NOTE — Telephone Encounter (Signed)
Received fax back from touch prescribing program that pt re-authorized from 10/22/19-05/23/20. Pt enrollment # [id number]. Account: [facility]. Site auth # [id number].

## 2019-10-22 NOTE — Telephone Encounter (Signed)
Faxed completed/signed Tysabri pt status report and reauth questionnaire to [facility] at [phone]. Received confirmation.  

## 2019-11-01 ENCOUNTER — Ambulatory Visit: Payer: 59 | Admitting: Neurology

## 2019-11-02 ENCOUNTER — Other Ambulatory Visit: Payer: Self-pay | Admitting: Neurology

## 2019-11-26 ENCOUNTER — Ambulatory Visit: Payer: 59 | Attending: Internal Medicine

## 2019-11-26 DIAGNOSIS — Z20822 Contact with and (suspected) exposure to covid-19: Secondary | ICD-10-CM

## 2019-11-27 LAB — NOVEL CORONAVIRUS, NAA: SARS-CoV-2, NAA: NOT DETECTED

## 2019-12-15 ENCOUNTER — Other Ambulatory Visit: Payer: Self-pay | Admitting: Family Medicine

## 2019-12-24 LAB — HM PAP SMEAR: HM Pap smear: NEGATIVE

## 2020-01-08 ENCOUNTER — Ambulatory Visit: Payer: 59 | Admitting: Neurology

## 2020-01-09 ENCOUNTER — Ambulatory Visit (HOSPITAL_COMMUNITY)
Admission: EM | Admit: 2020-01-09 | Discharge: 2020-01-09 | Disposition: A | Discharge status: Home / Self Care | Payer: 59 | Attending: Family Medicine | Admitting: Family Medicine

## 2020-01-09 ENCOUNTER — Encounter (HOSPITAL_COMMUNITY): Payer: Self-pay

## 2020-01-09 ENCOUNTER — Other Ambulatory Visit: Payer: Self-pay

## 2020-01-09 DIAGNOSIS — R300 Dysuria: Secondary | ICD-10-CM | POA: Diagnosis not present

## 2020-01-09 DIAGNOSIS — N3090 Cystitis, unspecified without hematuria: Secondary | ICD-10-CM | POA: Diagnosis not present

## 2020-01-09 DIAGNOSIS — R350 Frequency of micturition: Secondary | ICD-10-CM

## 2020-01-09 LAB — POCT URINALYSIS DIP (DEVICE)
Bilirubin Urine: NEGATIVE
Glucose, UA: NEGATIVE mg/dL
Ketones, ur: NEGATIVE mg/dL
Leukocytes,Ua: NEGATIVE
Nitrite: NEGATIVE
Protein, ur: NEGATIVE mg/dL
Specific Gravity, Urine: 1.02 (ref 1.005–1.030)
Urobilinogen, UA: 0.2 mg/dL (ref 0.0–1.0)
pH: 6.5 (ref 5.0–8.0)

## 2020-01-09 MED ORDER — CIPROFLOXACIN HCL 500 MG PO TABS: 500.0000 mg | ORAL_TABLET | Freq: Two times a day (BID) | ORAL | 0 refills | Status: DC

## 2020-01-09 MED ORDER — MIRABEGRON ER 50 MG PO TB24: 50.0000 mg | ORAL_TABLET | Freq: Every day | ORAL | 0 refills | Status: DC

## 2020-01-09 NOTE — ED Provider Notes (Signed)
[name]    CSN: [id number] Arrival date & time: 01/09/20  1920      History   Chief Complaint Chief Complaint  Patient presents with  . Urinary Tract Infection    HPI [name] is a 34 y.o. female.   Reports that she is experiencing urinary frequency and having bladder spasms. Reports that she finished Macrobid for UTI yesterday and is still having symptoms. States that she took an at home test for UTI and it showed that she had leukocytes in her urine. Denies fever, headaches, body aches, chills, fatigue, n/v/d, rash, other symptoms.   ROS per HPI  The history is provided by the patient.    Past Medical History:  Diagnosis Date  . Allergic rhinitis due to pollen 02/06/2010  . Esophageal reflux 11/29/2008  . HEADACHE, CLUSTER, CHRONIC 02/06/2010   patient reports migraines, not cluster headaches from problem list  . Hypertension   . Multiple sclerosis ([place])   . Neuromuscular disorder [facility])    multiple sclerosis    Patient Active Problem List   Diagnosis Date Noted  . Anxiety 08/25/2018  . Leiomyoma 02/21/2018  . Abnormal brain MRI 04/16/2016  . Multiple sclerosis ([place]) 04/09/2016  . High risk medication use 04/09/2016  . Numbness 04/09/2016  . Urinary frequency 04/09/2016  . Other fatigue 04/09/2016  . Essential hypertension 04/09/2016  . Migraine 03/29/2016  . Allergic rhinitis due to pollen 02/06/2010    Past Surgical History:  Procedure Laterality Date  . CESAREAN SECTION  06/29/2012   Procedure: CESAREAN SECTION;  Surgeon: Allena Katz, MD;  Location: [facility];  Service: Obstetrics;  Laterality: N/A;  . IUD REMOVAL  2018  . MYOMECTOMY N/A 02/21/2018   Procedure: MYOMECTOMY, ABDOMINAL;  Surgeon: Molli Posey, MD;  Location: [facility];  Service: Gynecology;  Laterality: N/A;    OB History    Gravida  1   Para  1   Term  1   Preterm  0   AB  0   Living  1     SAB  0   TAB  0   Ectopic  0   Multiple  0   Live  Births  1            Home Medications    Prior to Admission medications   Medication Sig Start Date End Date Taking? Authorizing Provider  baclofen (LIORESAL) 10 MG tablet TAKE 1 TABLET BY MOUTH THREE TIMES A DAY 07/18/19   Sater, Nanine Means, MD  busPIRone (BUSPAR) 5 MG tablet Take 1 tablet (5 mg total) by mouth 3 (three) times daily as needed. 10/05/19   Marin Olp, MD  cetirizine (ZYRTEC) 10 MG tablet TAKE 1 TABLET BY MOUTH EVERY DAY AS NEEDED FOR ALLERGY 12/17/19   Marin Olp, MD  ciprofloxacin (CIPRO) 500 MG tablet Take 1 tablet (500 mg total) by mouth every 12 (twelve) hours. 01/09/20   Faustino Congress, NP  fluticasone (FLONASE) 50 MCG/ACT nasal spray TWO SPRAYS EACH NOSTRIL ONCE A DAY Patient taking differently: TWO SPRAYS EACH NOSTRIL ONCE A DAY AS NEEDED 06/29/17   Sater, Nanine Means, MD  ibuprofen (ADVIL,MOTRIN) 800 MG tablet Take 1 tablet (800 mg total) by mouth every 8 (eight) hours as needed (mild pain). 02/23/18   Molli Posey, MD  mirabegron ER (MYRBETRIQ) 50 MG TB24 tablet Take 1 tablet (50 mg total) by mouth daily. 01/09/20   Faustino Congress, NP  natalizumab (TYSABRI) 300 MG/15ML injection Inject  300 mg into the vein every 30 (thirty) days.    [provider]  oxybutynin (DITROPAN-XL) 10 MG 24 hr tablet TAKE 1 TABLET BY MOUTH EVERYDAY AT BEDTIME 03/12/19   Sater, Nanine Means, MD  phentermine 37.5 MG capsule TAKE 1 CAPSULE BY MOUTH EVERY DAY 11/05/19   Penumalli, Earlean Polka, MD  rizatriptan (MAXALT-MLT) 10 MG disintegrating tablet Take 1 tablet (10 mg total) by mouth as needed for migraine. May repeat in 2 hours if needed 10/05/19   Marin Olp, MD  triamterene-hydrochlorothiazide (MAXZIDE-25) 37.5-25 MG tablet Take 1 tablet by mouth daily. 10/05/19   Marin Olp, MD    Family History Family History  Problem Relation Age of Onset  . Lung cancer Maternal Grandfather   . Hypertension Mother   . Hypertension Father   . Mental illness  Maternal Grandmother   . Diabetes Maternal Grandmother   . Glaucoma Paternal Grandmother   . Hypertension Paternal Grandfather     Social History Social History   Tobacco Use  . Smoking status: Never Smoker  . Smokeless tobacco: Never Used  Substance Use Topics  . Alcohol use: Yes    Alcohol/week: 0.0 - 1.0 standard drinks    Comment: occ  . Drug use: No     Allergies   Patient has no known allergies.   Review of Systems Review of Systems   Physical Exam Triage Vital Signs ED Triage Vitals  Enc Vitals Group     BP      Pulse      Resp      Temp      Temp src      SpO2      Weight      Height      Head Circumference      Peak Flow      Pain Score      Pain Loc      Pain Edu?      Excl. in [place]?    No data found.  Updated Vital Signs BP (!) 177/113 (BP Location: Right Arm)   Pulse 86   Temp 99.5 F (37.5 C)   Resp 18   Wt 214 lb (97.1 kg)   LMP 11/27/2019   SpO2 100%   BMI 36.17 kg/m   Visual Acuity Right Eye Distance:   Left Eye Distance:   Bilateral Distance:    Right Eye Near:   Left Eye Near:    Bilateral Near:     Physical Exam Vitals and nursing note reviewed.  Constitutional:      General: She is not in acute distress.    Appearance: She is well-developed. She is obese.  HENT:     Head: Normocephalic and atraumatic.     Right Ear: Tympanic membrane and ear canal normal.     Left Ear: Tympanic membrane and ear canal normal.     Mouth/Throat:     Mouth: Mucous membranes are moist.  Eyes:     Conjunctiva/sclera: Conjunctivae normal.  Cardiovascular:     Rate and Rhythm: Normal rate and regular rhythm.     Heart sounds: Normal heart sounds. No murmur.  Pulmonary:     Effort: Pulmonary effort is normal. No respiratory distress.     Breath sounds: Normal breath sounds. No stridor. No wheezing, rhonchi or rales.  Abdominal:     General: Bowel sounds are normal. There is no distension.     Palpations: Abdomen is soft.  Tenderness: There is abdominal tenderness. There is no right CVA tenderness, left CVA tenderness, guarding or rebound.     Comments: suprapubic  Musculoskeletal:        General: No swelling, tenderness, deformity or signs of injury.     Cervical back: Neck supple.  Skin:    General: Skin is warm and dry.     Capillary Refill: Capillary refill takes less than 2 seconds.  Neurological:     General: No focal deficit present.     Mental Status: She is alert and oriented to person, place, and time.  Psychiatric:        Mood and Affect: Mood normal.        Behavior: Behavior normal.      UC Treatments / Results  Labs (all labs ordered are listed, but only abnormal results are displayed) Labs Reviewed  POCT URINALYSIS DIP (DEVICE) - Abnormal; Notable for the following components:      Result Value   Hgb urine dipstick TRACE (*)    All other components within normal limits  URINE CULTURE    EKG   Radiology No results found.  Procedures Procedures (including critical care time)  Medications Ordered in UC Medications - No data to display  Initial Impression / Assessment and Plan / UC Course  I have reviewed the triage vital signs and the nursing notes.  Pertinent labs & imaging results that were available during my care of the patient were reviewed by me and considered in my medical decision making (see chart for details).     Presents with urinary frequency and suprapubic tenderness to palpation. History significant for MS, UTIs, GERD, HTN. UA showed trace blood. Will culture, given symptoms post treatment for UTI. Sent in Myrbetriq for bladder spasms, will send in Cipro for patient to have in case urine culture comes back positive, while she is out of town. Will call with results and instructions on whether to start antibiotic or not. Instructed to follow up with primary care as needed.  Final Clinical Impressions(s) / UC Diagnoses   Final diagnoses:  Dysuria  Urinary  frequency  Cystitis     Discharge Instructions     We will culture your urine and call to let know whether to take them or not.  I have also sent in Myrbetriq for bladder pain.  Follow up with primary care as needed.     ED Prescriptions    Medication Sig Dispense Auth. Provider   mirabegron ER (MYRBETRIQ) 50 MG TB24 tablet Take 1 tablet (50 mg total) by mouth daily. 30 tablet Faustino Congress, NP   ciprofloxacin (CIPRO) 500 MG tablet Take 1 tablet (500 mg total) by mouth every 12 (twelve) hours. 10 tablet Faustino Congress, NP     I have reviewed the PDMP during this encounter.   Faustino Congress, NP 01/09/20 2217

## 2020-01-09 NOTE — Discharge Instructions (Signed)
We will culture your urine and call to let know whether to take them or not.  I have also sent in Myrbetriq for bladder pain.  Follow up with primary care as needed.

## 2020-01-09 NOTE — ED Triage Notes (Signed)
Pt state she has a UTI. Pt states has pain in lower back. This has been going on for a week.

## 2020-01-10 LAB — URINE CULTURE: Culture: <10000 — AB

## 2020-01-16 ENCOUNTER — Ambulatory Visit: Payer: 59 | Attending: Internal Medicine

## 2020-01-16 DIAGNOSIS — Z20822 Contact with and (suspected) exposure to covid-19: Secondary | ICD-10-CM

## 2020-01-17 LAB — NOVEL CORONAVIRUS, NAA: SARS-CoV-2, NAA: NOT DETECTED

## 2020-01-31 ENCOUNTER — Encounter: Payer: Self-pay | Admitting: Family Medicine

## 2020-01-31 NOTE — Progress Notes (Deleted)
Phone: [phone]   Subjective:  Patient presents today for their annual physical. Chief complaint-noted.   See problem oriented charting- ROS- full  review of systems was completed and negative except for: ***  The following were reviewed and entered/updated in epic: Past Medical History:  Diagnosis Date  . Allergic rhinitis due to pollen 02/06/2010  . Esophageal reflux 11/29/2008  . HEADACHE, CLUSTER, CHRONIC 02/06/2010   patient reports migraines, not cluster headaches from problem list  . Hypertension   . Multiple sclerosis ([place])   . Neuromuscular disorder [facility])    multiple sclerosis   Patient Active Problem List   Diagnosis Date Noted  . Anxiety 08/25/2018  . Leiomyoma 02/21/2018  . Abnormal brain MRI 04/16/2016  . Multiple sclerosis ([place]) 04/09/2016  . High risk medication use 04/09/2016  . Numbness 04/09/2016  . Urinary frequency 04/09/2016  . Other fatigue 04/09/2016  . Essential hypertension 04/09/2016  . Migraine 03/29/2016  . Allergic rhinitis due to pollen 02/06/2010   Past Surgical History:  Procedure Laterality Date  . CESAREAN SECTION  06/29/2012   Procedure: CESAREAN SECTION;  Surgeon: Allena Katz, MD;  Location: [facility];  Service: Obstetrics;  Laterality: N/A;  . IUD REMOVAL  2018  . MYOMECTOMY N/A 02/21/2018   Procedure: MYOMECTOMY, ABDOMINAL;  Surgeon: Molli Posey, MD;  Location: [facility];  Service: Gynecology;  Laterality: N/A;    Family History  Problem Relation Age of Onset  . Lung cancer Maternal Grandfather   . Hypertension Mother   . Hypertension Father   . Mental illness Maternal Grandmother   . Diabetes Maternal Grandmother   . Glaucoma Paternal Grandmother   . Hypertension Paternal Grandfather     Medications- reviewed and updated Current Outpatient Medications  Medication Sig Dispense Refill  . baclofen (LIORESAL) 10 MG tablet TAKE 1 TABLET BY MOUTH THREE TIMES A DAY 270 tablet 1  . busPIRone (BUSPAR) 5 MG tablet Take 1  tablet (5 mg total) by mouth 3 (three) times daily as needed. 60 tablet 2  . cetirizine (ZYRTEC) 10 MG tablet TAKE 1 TABLET BY MOUTH EVERY DAY AS NEEDED FOR ALLERGY 90 tablet 3  . ciprofloxacin (CIPRO) 500 MG tablet Take 1 tablet (500 mg total) by mouth every 12 (twelve) hours. 10 tablet 0  . fluticasone (FLONASE) 50 MCG/ACT nasal spray TWO SPRAYS EACH NOSTRIL ONCE A DAY (Patient taking differently: TWO SPRAYS EACH NOSTRIL ONCE A DAY AS NEEDED) 1 g 0  . ibuprofen (ADVIL,MOTRIN) 800 MG tablet Take 1 tablet (800 mg total) by mouth every 8 (eight) hours as needed (mild pain). 30 tablet 0  . mirabegron ER (MYRBETRIQ) 50 MG TB24 tablet Take 1 tablet (50 mg total) by mouth daily. 30 tablet 0  . natalizumab (TYSABRI) 300 MG/15ML injection Inject 300 mg into the vein every 30 (thirty) days.    Marland Kitchen oxybutynin (DITROPAN-XL) 10 MG 24 hr tablet TAKE 1 TABLET BY MOUTH EVERYDAY AT BEDTIME 90 tablet 3  . phentermine 37.5 MG capsule TAKE 1 CAPSULE BY MOUTH EVERY DAY 30 capsule 5  . rizatriptan (MAXALT-MLT) 10 MG disintegrating tablet Take 1 tablet (10 mg total) by mouth as needed for migraine. May repeat in 2 hours if needed 9 tablet 3  . triamterene-hydrochlorothiazide (MAXZIDE-25) 37.5-25 MG tablet Take 1 tablet by mouth daily. 90 tablet 3   No current facility-administered medications for this visit.   Facility-Administered Medications Ordered in Other Visits  Medication Dose Route Frequency Provider Last Rate Last Admin  . gadoteridol (PROHANCE)  injection 13 mL  13 mL Intravenous Once PRN Melvenia Beam, MD        Allergies-reviewed and updated No Known Allergies  Social History   Social History Narrative   Lives at home with son (born 2013)  and son's father.       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    Prior  Advertising account executive (Pharmacist, community) Garment/textile technologist.        Wears glasses.   Caffeine none.   Objective  Objective:  There were no vitals taken for this  visit. Gen: NAD, resting comfortably HEENT: Mucous membranes are moist. Oropharynx normal Neck: no thyromegaly CV: RRR no murmurs rubs or gallops Lungs: CTAB no crackles, wheeze, rhonchi Abdomen: soft/nontender/nondistended/normal bowel sounds. No rebound or guarding.  Ext: no edema Skin: warm, dry Neuro: grossly normal, moves all extremities, PERRLA***   Assessment and Plan   34 y.o. female presenting for annual physical.  Health Maintenance counseling: 1. Anticipatory guidance: Patient counseled regarding regular dental exams ***q6 months, eye exams ***,  avoiding smoking and second hand smoke*** , limiting alcohol to 1 beverage per day*** .   2. Risk factor reduction:  Advised patient of need for regular exercise and diet rich and fruits and vegetables to reduce risk of heart attack and stroke. Exercise- ***. Diet-***.  Wt Readings from Last 3 Encounters:  01/09/20 214 lb (97.1 kg)  10/05/19 198 lb (89.8 kg)  12/11/18 198 lb 12.8 oz (90.2 kg)   3. Immunizations/screenings/ancillary studies Immunization History  Administered Date(s) Administered  . Influenza Split 08/30/2011   Health Maintenance Due  Topic Date Due  . TETANUS/TDAP  Never done   4. Cervical cancer screening- *** 5. Breast cancer screening-  breast exam *** and mammogram *** 6. Colon cancer screening - *** 7. Skin cancer screening- ***advised regular sunscreen use. Denies worrisome, changing, or new skin lesions.  8. Birth control/STD check- *** 9. Osteoporosis screening at 37- *** -*** smoker  Status of chronic or acute concerns    #Hypertension S: Compliant with triamterene hydrochlorothiazide 37.5-25 mg. A/P: ***    % Multiple sclerosis S: Patient is doing reasonably well on Tysabri.  Continues to follow with Dr. Felecia Shelling. A/P: ***    %Overactive bladder/urinary frequency- improved recently in 2020- using oxybutynin less. Thinks moving BP medicine to night was helpful    #***  S: Patient was  started on Lexapro 10 mg by Dr. Felecia Shelling. She states primarily due to anxiety issues- she states has done better on medication A/P: ***   *** No diagnosis found.  Recommended follow up: ***No follow-ups on file. Future Appointments  Date Time Provider [place]  02/21/2020 11:00 AM Sater, Nanine Means, MD [facility] None    No chief complaint on file.  Lab/Order associations:*** fasting No diagnosis found.  No orders of the defined types were placed in this encounter.   Return precautions advised.  Thomes Cake, [place]

## 2020-02-04 ENCOUNTER — Other Ambulatory Visit: Payer: Self-pay | Admitting: *Deleted

## 2020-02-04 DIAGNOSIS — G35 Multiple sclerosis: Secondary | ICD-10-CM

## 2020-02-04 DIAGNOSIS — Z79899 Other long term (current) drug therapy: Secondary | ICD-10-CM

## 2020-02-04 NOTE — Progress Notes (Addendum)
Pt here today for Tysabri infusion. She is due for labs. Placed future order for JCV and CBC w/ diff/platelet. She has f/u 02/21/20 at 11am with Dr. Felecia Shelling. Reminded her of this appt.  Placed JCV lab in [facility] lock box for routine lab pick up. Results pending.  JCV ab drawn on 02/04/20 indeterminate, index: 0.34. Inhibition assay: negative.

## 2020-02-04 NOTE — Addendum Note (Signed)
Addended by: Inis Sizer D on: 02/04/2020 02:26 PM   Modules accepted: Orders

## 2020-02-05 LAB — CBC WITH DIFFERENTIAL/PLATELET
Basophils Absolute: 0.1 10*3/uL (ref 0.0–0.2)
Basos: 1 %
EOS (ABSOLUTE): 0.2 10*3/uL (ref 0.0–0.4)
Eos: 1 %
Hematocrit: 35.5 % (ref 34.0–46.6)
Hemoglobin: 12.2 g/dL (ref 11.1–15.9)
Immature Grans (Abs): 0 10*3/uL (ref 0.0–0.1)
Immature Granulocytes: 0 %
Lymphocytes Absolute: 6.2 10*3/uL — ABNORMAL HIGH (ref 0.7–3.1)
Lymphs: 51 %
MCH: 29 pg (ref 26.6–33.0)
MCHC: 34.4 g/dL (ref 31.5–35.7)
MCV: 85 fL (ref 79–97)
Monocytes Absolute: 1.5 10*3/uL — ABNORMAL HIGH (ref 0.1–0.9)
Monocytes: 13 %
Neutrophils Absolute: 4.1 10*3/uL (ref 1.4–7.0)
Neutrophils: 34 %
Platelets: 355 10*3/uL (ref 150–450)
RBC: 4.2 x10E6/uL (ref 3.77–5.28)
RDW: 14.6 % (ref 11.7–15.4)
WBC: 12.1 10*3/uL — ABNORMAL HIGH (ref 3.4–10.8)

## 2020-02-06 ENCOUNTER — Encounter: Payer: Self-pay | Admitting: Family Medicine

## 2020-02-10 LAB — RFLX STRATIFY JCV (TM) AB INHIBITION: JCV Antibody by Inhibition: NEGATIVE

## 2020-02-10 LAB — STRATIFY JCV AB (W/ INDEX) W/ RFLX
Index Value: 0.34 — ABNORMAL HIGH
Stratify JCV (TM) Ab w/Reflex Inhibition: UNDETERMINED — AB

## 2020-02-13 ENCOUNTER — Encounter: Payer: Self-pay | Admitting: *Deleted

## 2020-02-21 ENCOUNTER — Encounter: Payer: Self-pay | Admitting: Neurology

## 2020-02-21 ENCOUNTER — Other Ambulatory Visit: Payer: Self-pay

## 2020-02-21 ENCOUNTER — Ambulatory Visit (INDEPENDENT_AMBULATORY_CARE_PROVIDER_SITE_OTHER): Payer: 59 | Admitting: Neurology

## 2020-02-21 VITALS — BP 140/85 | HR 96 | Temp 97.4°F | Ht 64.5 in | Wt 212.0 lb

## 2020-02-21 DIAGNOSIS — Z79899 Other long term (current) drug therapy: Secondary | ICD-10-CM

## 2020-02-21 DIAGNOSIS — R5383 Other fatigue: Secondary | ICD-10-CM

## 2020-02-21 DIAGNOSIS — G35 Multiple sclerosis: Secondary | ICD-10-CM | POA: Diagnosis not present

## 2020-02-21 DIAGNOSIS — R350 Frequency of micturition: Secondary | ICD-10-CM | POA: Diagnosis not present

## 2020-02-21 DIAGNOSIS — R799 Abnormal finding of blood chemistry, unspecified: Secondary | ICD-10-CM

## 2020-02-21 DIAGNOSIS — G43109 Migraine with aura, not intractable, without status migrainosus: Secondary | ICD-10-CM

## 2020-02-21 MED ORDER — ALPRAZOLAM 0.5 MG PO TABS: ORAL_TABLET | ORAL | 0 refills | Status: DC

## 2020-02-21 NOTE — Progress Notes (Signed)
[facility]  PATIENT: [name] DOB: May 31, 1986  REFERRING DOCTOR OR PCP:  Garret Reddish SOURCE: Patient, notes from [facility], lab and other results from [facility], MRI images personally reviewed on PACS  _________________________________   HISTORICAL  CHIEF COMPLAINT:  Chief Complaint  Patient presents with  . Follow-up    RM 12, alone. Last seen 04/26/2019  . Multiple Sclerosis    On Tysabri. Last infusion: 02/04/2020. Next one: 03/03/2020. Last JCV 02/04/20 indeterminate, index: 0.34. Inhibition assay: negative.     HISTORY OF PRESENT ILLNESS:  [name] is a 34 y.o. woman with relapsing remitting MS.       Update 02/21/2020: She feels her MS is stable and she denies any exacerbations.    She is on Tysabri and tolerates. Last MRI was 08/29/2018.  JCV Ab was negative 02/04/2020.    Gait is doing well.   She can go up and down stairs well.   If she is more active she does get winded more.  She is starting to work out more and gained some weight during the pandemic.   She is on phentermine and feels it helps her fatigue some.   She feels strength is fine.   Sometimes she gets muscle cramps in her calves.    Her urinary urgency is helped by oxybutynin.    She has no new vision changes.    Migraines have done better and now occur once a week.  Maxalt helps most of the time.   She is sleeping well most night.  Update 04/26/2019 (virtual) She is on Tysabri and her infusions are doing well.  Next infusion will be 05/16/2019.  She has had nausea a few times with the recent infusionShe feels her MS hahs been mostly stable.  No exacerbations.   She does note more cramps in her feet/legs.   The foot cramps occur mostly during the day.   Gait and balance are fine.  She will have some tingling at times in her feet, but usually with the cramps.  She has urinary frequency, helped by oxybutynin,   Vision is fine ans she has seen ophtho this year.   Her fatigue is mild  some days and more severe some days.   Phentermine has helped fatigue and also helped her lose 15 pounds.     Migraines have done well the past 6 months with no severe headache.  She still gets some milder headaches 2-3 times a week.  She sometimes takes Tylenol with benefit and Ibuprofen 800 mg helps if Tylenol does not.   She is sleeping well most night.   Assessment and Plan:  No diagnosis found. 1.  Continue Tysabri.  We need to check a JCV antibody titer and CBC when she comes in for her next infusion.  She has been JCV antibody negative in the past. 2.  Stay active and exercise as tolerated. 3.   Renew phentermine for fatigue and weight loss. 4.   Add baclofen as it may help her spasms.  She can take as needed. 5.   She will return to see me in 6 months or sooner if there are new or worsening neurologic symptoms.     Update 08/25/2018: She feels her MS has done well for the most part but she has noted some depression and headache.    She is on Tysabri and infusion have gone well.   She has a JCVAb titer of 0.29 last check and we will recheck  again today.   Gait is fine.   No new numbness or weakness.   Bladder is doing well.    Vision is stable.    She has felt a nervousness the last 4 months with anxiety.  At times it is more intense for 10-15 minutes with heart pounding.      She notes mild depression.    She has some stress but there is little change from last year.   She notes mild fatigue, helped by phentermine.   She also lost 7 pounds.      She gets migraine headaches about twice a month.  These usually last one day.  She has not tried triptans in the past.  Update 02/17/2018: Her Tysabri infusions are doing well.   She gets some cramping in the left leg and the right forearm/hand at times.   She is JCV Ab negative last tested 04/2017  She has to walk a long distance from the car to her work and she cramps up more during the walk.    Her balance is ok.    Strength is mostly fine  unless she walks longer distances.    She denies constant numbness or tingling.  However, if she sits a long time, her left leg goes numb and she needs to get up to moves.   Her bladder function is much better on ditropan.  Vision is stable.   She wears glasses.  No MS related visual issues.    She has low fatigue in the morning but she struggles to get out of bed.  Fatigue is worse in the afternoon.   She is off phentermine until after gyn surgery.  She gets 5-6 hours sleep most nights.  Mood is fine.   She denies cognitive issues.   She gets some headaches usually in the forehead in the afternoon.   No N/V.   No photo/phonophobia.     From 04/18/2017: She started Tysabri June 2017 and is tolerating it very well.  She denies any exacerbations and old symptoms have mostly resolved.    Strength/sensation/gait: Her gait and balance are doing well.    She denies weakness but she gets cramps in her leg muscles, usually in the afternoons.   She exercises (works out in gym) daily in the mornings.     She denies arm numbness/tingling now.    Vision: She does not note any difficulty with visual acuity now or in the past. There is no diplopia. There is no eye pain.  . She does wear glasses.  Bladder/bowel: Bladder urgency and nocturia is better on Ditropan.   It also helps her sleep since she no longer wakes up for nocturia   Fatigue/sleep: She rarely has fatigue in the mornings but will have some afternoons.  Phentermine helps fatigue.   SHe is now sleeping better.   She does not need trazodone now  Mood/cognition: She denies any depression or anxiety. She has not noted any significant changes with cognitive function.   MS History:   In May, 2017,  she had the onset of right arm numbness and weakness that developed over one day.       She went to the emergency room and an MRI of the brain on 03/28/2016 showed multiple T2/FLAIR hyperintense foci consistent with multiple sclerosis. She was admitted and  received 5 days of IV steroids. While in the hospital she had a contrasted MRI of the brain that showed that 2 of those lesions enhanced  consistent with more acute onset. Additionally, she had 2 lesions in the cervical spine, both enhancing. The one at C6 was towards the right and could explain her symptoms. Another one was adjacent to C3-C4, posteriorly. She also had a lumbar puncture. The cerebrospinal fluid shows that there were 9 oligoclonal bands in the CSF which is also consistent with multiple sclerosis. Due to the aggressiveness of her MS, she was started on Tysabri. She is JCV antibody negative (0.25).   In retrospect, in 2011, she had headaches and tremors and had an MRI of the brain. It showed a single T2/FLAIR hyperintense focus in the right periventricular white matter of the parietal lobe.   Follow-up MRI in 2012 did not show any new lesions. The symptoms improved and she had no other symptoms until 2017  REVIEW OF SYSTEMS: Review of Systems  Constitutional: Positive for malaise/fatigue.  HENT: Negative for congestion and hearing loss.   Eyes: Negative.  Negative for blurred vision, double vision, photophobia and pain.  Respiratory: Negative for cough.   Cardiovascular: Negative for chest pain.  Gastrointestinal: Negative for abdominal pain, nausea and vomiting.  Genitourinary: Positive for frequency and urgency. Negative for dysuria.  Musculoskeletal: Negative for back pain and neck pain.  Skin: Negative for itching and rash.  Neurological: Negative.   Endo/Heme/Allergies: Negative.   Psychiatric/Behavioral: Negative for depression. The patient has insomnia.     ALLERGIES: No Known Allergies  HOME MEDICATIONS:  Current Outpatient Medications:  .  baclofen (LIORESAL) 10 MG tablet, TAKE 1 TABLET BY MOUTH THREE TIMES A DAY, Disp: 270 tablet, Rfl: 1 .  busPIRone (BUSPAR) 5 MG tablet, Take 1 tablet (5 mg total) by mouth 3 (three) times daily as needed., Disp: 60 tablet, Rfl: 2 .   cetirizine (ZYRTEC) 10 MG tablet, TAKE 1 TABLET BY MOUTH EVERY DAY AS NEEDED FOR ALLERGY, Disp: 90 tablet, Rfl: 3 .  ciprofloxacin (CIPRO) 500 MG tablet, Take 1 tablet (500 mg total) by mouth every 12 (twelve) hours., Disp: 10 tablet, Rfl: 0 .  fluticasone (FLONASE) 50 MCG/ACT nasal spray, TWO SPRAYS EACH NOSTRIL ONCE A DAY (Patient taking differently: TWO SPRAYS EACH NOSTRIL ONCE A DAY AS NEEDED), Disp: 1 g, Rfl: 0 .  ibuprofen (ADVIL,MOTRIN) 800 MG tablet, Take 1 tablet (800 mg total) by mouth every 8 (eight) hours as needed (mild pain)., Disp: 30 tablet, Rfl: 0 .  mirabegron ER (MYRBETRIQ) 50 MG TB24 tablet, Take 1 tablet (50 mg total) by mouth daily., Disp: 30 tablet, Rfl: 0 .  natalizumab (TYSABRI) 300 MG/15ML injection, Inject 300 mg into the vein every 30 (thirty) days., Disp: , Rfl:  .  oxybutynin (DITROPAN-XL) 10 MG 24 hr tablet, TAKE 1 TABLET BY MOUTH EVERYDAY AT BEDTIME, Disp: 90 tablet, Rfl: 3 .  phentermine 37.5 MG capsule, TAKE 1 CAPSULE BY MOUTH EVERY DAY, Disp: 30 capsule, Rfl: 5 .  rizatriptan (MAXALT-MLT) 10 MG disintegrating tablet, Take 1 tablet (10 mg total) by mouth as needed for migraine. May repeat in 2 hours if needed, Disp: 9 tablet, Rfl: 3 .  triamterene-hydrochlorothiazide (MAXZIDE-25) 37.5-25 MG tablet, Take 1 tablet by mouth daily., Disp: 90 tablet, Rfl: 3 No current facility-administered medications for this visit.  Facility-Administered Medications Ordered in Other Visits:  .  gadoteridol (PROHANCE) injection 13 mL, 13 mL, Intravenous, Once PRN, Melvenia Beam, MD  PAST MEDICAL HISTORY: Past Medical History:  Diagnosis Date  . Allergic rhinitis due to pollen 02/06/2010  . Esophageal reflux 11/29/2008  . HEADACHE, CLUSTER, CHRONIC  02/06/2010   patient reports migraines, not cluster headaches from problem list  . Hypertension   . Multiple sclerosis ([place])   . Neuromuscular disorder ([place])    multiple sclerosis    PAST SURGICAL HISTORY: Past Surgical History:   Procedure Laterality Date  . CESAREAN SECTION  06/29/2012   Procedure: CESAREAN SECTION;  Surgeon: Allena Katz, MD;  Location: [facility];  Service: Obstetrics;  Laterality: N/A;  . IUD REMOVAL  2018  . MYOMECTOMY N/A 02/21/2018   Procedure: MYOMECTOMY, ABDOMINAL;  Surgeon: Molli Posey, MD;  Location: [facility];  Service: Gynecology;  Laterality: N/A;    FAMILY HISTORY: Family History  Problem Relation Age of Onset  . Lung cancer Maternal Grandfather   . Hypertension Mother   . Hypertension Father   . Mental illness Maternal Grandmother   . Diabetes Maternal Grandmother   . Glaucoma Paternal Grandmother   . Hypertension Paternal Grandfather     SOCIAL HISTORY:  Social History   Socioeconomic History  . Marital status: Single    Spouse name: Not on file  . Number of children: Not on file  . Years of education: Not on file  . Highest education level: Not on file  Occupational History  . Not on file  Tobacco Use  . Smoking status: Never Smoker  . Smokeless tobacco: Never Used  Substance and Sexual Activity  . Alcohol use: Yes    Alcohol/week: 0.0 - 1.0 standard drinks    Comment: occ  . Drug use: No  . Sexual activity: Yes    Birth control/protection: I.U.D.  Other Topics Concern  . Not on file  Social History Narrative   Lives at home with son (born 2013)  and son's father.       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    Prior  Advertising account executive (Pharmacist, community) Garment/textile technologist.        Wears glasses.   Caffeine none.   Social Determinants of Health   Financial Resource Strain:   . Difficulty of Paying Living Expenses:   Food Insecurity:   . Worried About Charity fundraiser in the Last Year:   . Arboriculturist in the Last Year:   Transportation Needs:   . Film/video editor (Medical):   Marland Kitchen Lack of Transportation (Non-Medical):   Physical Activity:   . Days of Exercise per Week:   . Minutes of Exercise per Session:    Stress:   . Feeling of Stress :   Social Connections:   . Frequency of Communication with Friends and Family:   . Frequency of Social Gatherings with Friends and Family:   . Attends Religious Services:   . Active Member of Clubs or Organizations:   . Attends Archivist Meetings:   Marland Kitchen Marital Status:   Intimate Partner Violence:   . Fear of Current or Ex-Partner:   . Emotionally Abused:   Marland Kitchen Physically Abused:   . Sexually Abused:      PHYSICAL EXAM  Vitals:   02/21/20 1100  BP: 140/85  Pulse: 96  Temp: (!) 97.4 F (36.3 C)  Weight: 212 lb (96.2 kg)  Height: 5' 4.5" (1.638 m)    Body mass index is 35.83 kg/m.   General: The patient is well-developed and well-nourished and in no acute distress  Extremities:    No rash or edema.  Neurologic Exam  Mental status: The patient is alert and oriented x 3  at the time of the examination. The patient has apparent normal recent and remote memory, with an apparently normal attention span and concentration ability.   Speech is normal.  Cranial nerves: Extraocular movements are full.  Facial strength was normal.. No obvious hearing deficits are noted.  Motor: Muscle bulk, tone and strength are normal.  Sensory: Sensory testing is intact to touch and vibration sensation in the arms and legs.   Coordination: Cerebellar testing reveals good finger-nose-finger and heel-to-shin bilaterally.  Gait and station: The station is normal and the gait is normal.  Tandem gait is slightly wide.  Romberg is negative.  Reflexes: Deep tendon reflexes are normal and symmetric bilaterally     DIAGNOSTIC DATA (LABS, IMAGING, TESTING) - I reviewed patient records, labs, notes, testing and imaging myself where available.  Lab Results  Component Value Date   WBC 12.1 (H) 02/04/2020   HGB 12.2 02/04/2020   HCT 35.5 02/04/2020   MCV 85 02/04/2020   PLT 355 02/04/2020      Component Value Date/Time   NA 137 12/11/2018 0945   K  4.0 12/11/2018 0945   CL 102 12/11/2018 0945   CO2 27 12/11/2018 0945   GLUCOSE 99 12/11/2018 0945   BUN 11 12/11/2018 0945   CREATININE 0.83 12/11/2018 0945   CALCIUM 9.7 12/11/2018 0945   PROT 7.6 12/11/2018 0945   ALBUMIN 4.4 12/11/2018 0945   ALBUMIN 3,800 03/31/2016 1024   AST 13 12/11/2018 0945   ALT 18 12/11/2018 0945   ALKPHOS 68 12/11/2018 0945   BILITOT 0.4 12/11/2018 0945   GFRNONAA >60 02/15/2018 0910   GFRAA >60 02/15/2018 0910    Lab Results  Component Value Date   TSH 0.70 11/29/2008    1.    Continue Tysabri 300 mg every 4 weeks for MS.  Recent JCV antibody was negative. 2.   MRI of the brain and Cervical spine to determine if there is any subclinical progression.  If this is occurring, consider a different disease modifying therapy. 3.   Stay active and exercise as tolerated.  Return in 6 months or sooner if there are new or worsening neurologic symptoms.  Leonarda Leis A. Felecia Shelling, MD, PhD [id number], 0000000 AM Certified in Neurology, Clinical Neurophysiology, Sleep Medicine, Pain Medicine and Neuroimaging  [facility] [address] [place], [place] [address] [phone]

## 2020-02-22 ENCOUNTER — Ambulatory Visit: Payer: 59 | Attending: Internal Medicine

## 2020-02-22 DIAGNOSIS — Z23 Encounter for immunization: Secondary | ICD-10-CM

## 2020-02-22 NOTE — Progress Notes (Signed)
   Covid-19 Vaccination Clinic  Name:  [name]    MRN: [id number] DOB: Jan 28, 1986  02/22/2020  [name] was observed post Covid-19 immunization for 15 minutes without incident. She was provided with Vaccine Information Sheet and instruction to access the V-Safe system.   [name] was instructed to call 911 with any severe reactions post vaccine: Marland Kitchen Difficulty breathing  . Swelling of face and throat  . A fast heartbeat  . A bad rash all over body  . Dizziness and weakness   Immunizations Administered    Name Date Dose VIS Date Route   Pfizer COVID-19 Vaccine 02/22/2020  3:54 PM 0.3 mL 10/26/2019 Intramuscular   Manufacturer: [place]   Lot: [id number]   [place]: [id number]

## 2020-02-25 ENCOUNTER — Telehealth: Payer: Self-pay | Admitting: Neurology

## 2020-02-25 NOTE — Telephone Encounter (Signed)
no to the covid questions MR Brain wo contrast & MR Cervical spine wo contrast Dr. Felecia Shelling [facility] Josem Kaufmann: [place] via [facility] website. Patient is scheduled at [facility] for 03/11/20.

## 2020-03-05 ENCOUNTER — Ambulatory Visit: Payer: 59 | Attending: Internal Medicine

## 2020-03-05 DIAGNOSIS — Z20822 Contact with and (suspected) exposure to covid-19: Secondary | ICD-10-CM

## 2020-03-06 LAB — NOVEL CORONAVIRUS, NAA: SARS-CoV-2, NAA: NOT DETECTED

## 2020-03-06 LAB — SARS-COV-2, NAA 2 DAY TAT

## 2020-03-11 ENCOUNTER — Ambulatory Visit (INDEPENDENT_AMBULATORY_CARE_PROVIDER_SITE_OTHER): Payer: 59

## 2020-03-11 ENCOUNTER — Other Ambulatory Visit: Payer: Self-pay

## 2020-03-11 DIAGNOSIS — G35 Multiple sclerosis: Secondary | ICD-10-CM | POA: Diagnosis not present

## 2020-03-17 ENCOUNTER — Ambulatory Visit: Payer: 59 | Attending: Internal Medicine

## 2020-03-17 DIAGNOSIS — Z23 Encounter for immunization: Secondary | ICD-10-CM

## 2020-03-17 NOTE — Progress Notes (Signed)
   Covid-19 Vaccination Clinic  Name:  [name]    MRN: [id number] DOB: Aug 18, 1986  03/17/2020  [name] was observed post Covid-19 immunization for 15 minutes without incident. She was provided with Vaccine Information Sheet and instruction to access the V-Safe system.   [name] was instructed to call 911 with any severe reactions post vaccine: Marland Kitchen Difficulty breathing  . Swelling of face and throat  . A fast heartbeat  . A bad rash all over body  . Dizziness and weakness   Immunizations Administered    Name Date Dose VIS Date Route   Pfizer COVID-19 Vaccine 03/17/2020  3:57 PM 0.3 mL 01/09/2019 Intramuscular   Manufacturer: [place]   Lot: [id number]   [place]: [id number]

## 2020-03-21 ENCOUNTER — Other Ambulatory Visit: Payer: Self-pay | Admitting: Neurology

## 2020-04-23 ENCOUNTER — Telehealth: Payer: Self-pay | Admitting: *Deleted

## 2020-04-23 NOTE — Telephone Encounter (Signed)
Faxed completed/signed Tysabri pt status report and reauth questionnaire to [facility] at [phone]. Received confirmation.  

## 2020-04-23 NOTE — Telephone Encounter (Signed)
Received fax from touch prescribing program that pt is re-authorized for tysabri from 04/23/20-11/22/20. Pt enrollment number. [id number]. Account:[facility]. Site auth number: [id number].

## 2020-06-05 ENCOUNTER — Other Ambulatory Visit: Payer: Self-pay | Admitting: Diagnostic Neuroimaging

## 2020-07-17 ENCOUNTER — Telehealth: Payer: Self-pay | Admitting: *Deleted

## 2020-07-17 ENCOUNTER — Other Ambulatory Visit: Payer: Self-pay | Admitting: *Deleted

## 2020-07-17 DIAGNOSIS — Z79899 Other long term (current) drug therapy: Secondary | ICD-10-CM

## 2020-07-17 DIAGNOSIS — G35 Multiple sclerosis: Secondary | ICD-10-CM

## 2020-07-17 DIAGNOSIS — R799 Abnormal finding of blood chemistry, unspecified: Secondary | ICD-10-CM

## 2020-07-17 NOTE — Addendum Note (Signed)
Addended by: Inis Sizer D on: 07/17/2020 10:56 AM   Modules accepted: Orders

## 2020-07-17 NOTE — Telephone Encounter (Signed)
Placed JCV lab in [facility] lock box for routine lab pick up. Results pending. 

## 2020-07-18 LAB — CBC WITH DIFFERENTIAL/PLATELET
Basophils Absolute: 0 10*3/uL (ref 0.0–0.2)
Basos: 0 %
EOS (ABSOLUTE): 0.1 10*3/uL (ref 0.0–0.4)
Eos: 1 %
Hematocrit: 34.4 % (ref 34.0–46.6)
Hemoglobin: 11.5 g/dL (ref 11.1–15.9)
Immature Grans (Abs): 0 10*3/uL (ref 0.0–0.1)
Immature Granulocytes: 0 %
Lymphocytes Absolute: 4.2 10*3/uL — ABNORMAL HIGH (ref 0.7–3.1)
Lymphs: 53 %
MCH: 28.5 pg (ref 26.6–33.0)
MCHC: 33.4 g/dL (ref 31.5–35.7)
MCV: 85 fL (ref 79–97)
Monocytes Absolute: 0.7 10*3/uL (ref 0.1–0.9)
Monocytes: 8 %
Neutrophils Absolute: 3.1 10*3/uL (ref 1.4–7.0)
Neutrophils: 38 %
Platelets: 281 10*3/uL (ref 150–450)
RBC: 4.03 x10E6/uL (ref 3.77–5.28)
RDW: 14.8 % (ref 11.7–15.4)
WBC: 8.1 10*3/uL (ref 3.4–10.8)

## 2020-07-22 NOTE — Patient Instructions (Addendum)
Health Maintenance Due  Topic Date Due  . Hepatitis C Screening  Never done  . TETANUS/TDAP - ask Dr. Felecia Shelling but I think we should update this if hes ok with it.  Never done  . INFLUENZA VACCINE -please let us know when you have gotten this.  06/15/2020   I would also message Dr. Felecia Shelling about covid 19 booster while on tysabri and when he would recommend this  Please stop by lab before you go If you have mychart- we will send your results within 3 business days of [place] receiving them.  If you do not have mychart- we will call you about results within 5 business days of [place] receiving them.  *please note we are currently using [facility] which has a longer processing time than [facility] typically so labs may not come back as quickly as in the past *please also note that you will see labs on mychart as soon as they post. I will later go in and write notes on them- will say "notes from Dr. Yong Channel"  I want you to stop phentermine for at least 2 weeks.  Please monitor blood pressure daily while off this medicine and update me in 2 weeks with all of your readings.  If blood pressure has improved we will likely simply remain on phentermine.  If it does not significantly improve I would strongly consider adding amlodipine 2.5 mg at night in addition to your triamterene hydrochlorothiazide.

## 2020-07-22 NOTE — Progress Notes (Signed)
Phone [phone] In person visit   Subjective:   [name] is a 34 y.o. year old very pleasant female patient who presents for/with See problem oriented charting Chief Complaint  Patient presents with  . Blood Pressure Check  . Medication Management   This visit occurred during the SARS-CoV-2 public health emergency.  Safety protocols were in place, including screening questions prior to the visit, additional usage of staff PPE, and extensive cleaning of exam room while observing appropriate contact time as indicated for disinfecting solutions.   Past Medical History-  Patient Active Problem List   Diagnosis Date Noted  . Multiple sclerosis ([place]) 04/09/2016    Priority: High  . Anxiety 08/25/2018    Priority: Medium  . Essential hypertension 04/09/2016    Priority: Medium  . Migraine 03/29/2016    Priority: Medium  . High risk medication use 04/09/2016    Priority: Low  . Allergic rhinitis due to pollen 02/06/2010    Priority: Low  . Immunosuppressed status ([place]) 07/24/2020  . Leiomyoma 02/21/2018  . Abnormal brain MRI 04/16/2016  . Numbness 04/09/2016  . Urinary frequency 04/09/2016  . Other fatigue 04/09/2016    Medications- reviewed and updated Current Outpatient Medications  Medication Sig Dispense Refill  . baclofen (LIORESAL) 10 MG tablet TAKE 1 TABLET BY MOUTH THREE TIMES A DAY (Patient taking differently: as needed. ) 270 tablet 1  . busPIRone (BUSPAR) 5 MG tablet Take 1 tablet (5 mg total) by mouth 3 (three) times daily as needed. 60 tablet 2  . cetirizine (ZYRTEC) 10 MG tablet TAKE 1 TABLET BY MOUTH EVERY DAY AS NEEDED FOR ALLERGY 90 tablet 3  . fluticasone (FLONASE) 50 MCG/ACT nasal spray TWO SPRAYS EACH NOSTRIL ONCE A DAY (Patient taking differently: TWO SPRAYS EACH NOSTRIL ONCE A DAY AS NEEDED) 1 g 0  . ibuprofen (ADVIL,MOTRIN) 800 MG tablet Take 1 tablet (800 mg total) by mouth every 8 (eight) hours as needed (mild pain). 30 tablet 0  .  natalizumab (TYSABRI) 300 MG/15ML injection Inject 300 mg into the vein every 30 (thirty) days.    Marland Kitchen oxybutynin (DITROPAN-XL) 10 MG 24 hr tablet TAKE 1 TABLET BY MOUTH EVERYDAY AT BEDTIME 90 tablet 3  . phentermine 37.5 MG capsule TAKE 1 CAPSULE BY MOUTH EVERY DAY 30 capsule 5  . rizatriptan (MAXALT-MLT) 10 MG disintegrating tablet Take 1 tablet (10 mg total) by mouth as needed for migraine. May repeat in 2 hours if needed 9 tablet 3  . triamterene-hydrochlorothiazide (MAXZIDE-25) 37.5-25 MG tablet Take 1 tablet by mouth daily. 90 tablet 3  . mirabegron ER (MYRBETRIQ) 50 MG TB24 tablet Take 1 tablet (50 mg total) by mouth daily. (Patient not taking: Reported on 07/24/2020) 30 tablet 0   No current facility-administered medications for this visit.   Facility-Administered Medications Ordered in Other Visits  Medication Dose Route Frequency Provider Last Rate Last Admin  . gadoteridol (PROHANCE) injection 13 mL  13 mL Intravenous Once PRN Melvenia Beam, MD         Objective:  BP (!) 144/78   Pulse 60   Temp 98.3 F (36.8 C) (Temporal)   Resp 18   Wt 204 lb (92.5 kg)   SpO2 99%   BMI 34.48 kg/m  Gen: NAD, resting comfortably     Assessment and Plan    #Hypertension S: Compliant with triamterene hydrochlorothiazide 37.5-25 mg- takes at night.  BP Readings from Last 3 Encounters:  07/24/20 (!) 144/78  02/21/20 140/85  01/09/20 Marland Kitchen)  177/113  Did not take her medication today. Her most recent blood pressure was 156/112 when she saw Dr. Felecia Shelling and she was on medicine tha tday. She stated that she is having headaches that last all day and thinks they can be related to her blood pressure - common for her with blood pressure high  At home lowest she saw 139/73. Average at ome in 140s. Working out daily still. No increased salt intake- actually eating better and less salt. Plus weight down 8 lbs since April. Only drinking water.   Reviewing meds- on phentermine. She thinks for 2 years A/P:  Blood pressure is poorly controlled at home and in office.  I think a reasonable first step is holding her phentermine for 2 weeks and see how she does.  If blood pressure does not respond enough may need to add amlodipine 2.5 mg at night.  She will message me in 2 weeks and we will plan on neck steps -I also messaged Dr. Felecia Shelling to keep him in the loop as he originally prescribed phentermine-I am not taking off medication list yet but will let follow-up if we need to keep her off.  Hoping her headaches improve as well -Also check CMP and TSH has not recently checked to see if any potential contribution to hypertension  #Immunosuppressed-patient immunosuppressed on Tysabri-I have requested for her to be able to continue to work from home if possible-requesting work accommodations  Recommended follow up: 2 week by [employer]- generally id liket o at least see her at least 6-12 months Future Appointments  Date Time Provider [place]  08/25/2020  2:30 PM Lomax, Amy, NP [facility] None    Lab/Order associations:   ICD-10-CM   1. Essential hypertension  K81 COMPLETE METABOLIC PANEL WITH GFR    TSH    COMPLETE METABOLIC PANEL WITH GFR    TSH    CANCELED: TSH  2. Immunosuppressed status ([place])  D84.9   3. Encounter for hepatitis C screening test for low risk patient  Z11.59 HEP C AB W/REFL    HEP C AB W/REFL   Return precautions advised.  Garret Reddish, MD

## 2020-07-23 ENCOUNTER — Telehealth: Payer: Self-pay | Admitting: Neurology

## 2020-07-23 LAB — STRATIFY JCV AB (W/ INDEX) W/ RFLX
Index Value: 0.18
Stratify JCV (TM) Ab w/Reflex Inhibition: NEGATIVE

## 2020-07-23 NOTE — Telephone Encounter (Signed)
Pt called need a note stating cannot return to work, to remain working from home. Pt would like to call from the nurse to give more details on questions needed to be answered.

## 2020-07-23 NOTE — Telephone Encounter (Signed)
LVM returning pt call. 

## 2020-07-24 ENCOUNTER — Ambulatory Visit (INDEPENDENT_AMBULATORY_CARE_PROVIDER_SITE_OTHER): Payer: 59 | Admitting: Family Medicine

## 2020-07-24 ENCOUNTER — Other Ambulatory Visit: Payer: Self-pay

## 2020-07-24 ENCOUNTER — Encounter: Payer: Self-pay | Admitting: Family Medicine

## 2020-07-24 VITALS — BP 144/78 | HR 60 | Temp 98.3°F | Resp 18 | Wt 204.0 lb

## 2020-07-24 DIAGNOSIS — D849 Immunodeficiency, unspecified: Secondary | ICD-10-CM | POA: Diagnosis not present

## 2020-07-24 DIAGNOSIS — I10 Essential (primary) hypertension: Secondary | ICD-10-CM

## 2020-07-24 DIAGNOSIS — Z1159 Encounter for screening for other viral diseases: Secondary | ICD-10-CM | POA: Diagnosis not present

## 2020-07-24 NOTE — Telephone Encounter (Signed)
Called, LVM again for pt to call office back

## 2020-07-24 NOTE — Assessment & Plan Note (Signed)
S: Compliant with triamterene hydrochlorothiazide 37.5-25 mg- takes at night.  BP Readings from Last 3 Encounters:  07/24/20 (!) 144/78  02/21/20 140/85  01/09/20 (!) 177/113  Did not take her medication today. Her most recent blood pressure was 156/112 when she saw Dr. Felecia Shelling and she was on medicine tha tday. She stated that she is having headaches that last all day and thinks they can be related to her blood pressure - common for her with blood pressure high  At home lowest she saw 139/73. Average at ome in 140s. Working out daily still. No increased salt intake- actually eating better and less salt. Plus weight down 8 lbs since April. Only drinking water.   Reviewing meds- on phentermine. She thinks for 2 years A/P: Blood pressure is poorly controlled at home and in office.  I think a reasonable first step is holding her phentermine for 2 weeks and see how she does.  If blood pressure does not respond enough may need to add amlodipine 2.5 mg at night.  She will message me in 2 weeks and we will plan on neck steps -I also messaged Dr. Felecia Shelling to keep him in the loop as he originally prescribed phentermine-I am not taking off medication list yet but will let follow-up if we need to keep her off.  Hoping her headaches improve as well

## 2020-07-25 ENCOUNTER — Encounter: Payer: Self-pay | Admitting: Family Medicine

## 2020-07-25 ENCOUNTER — Telehealth: Payer: Self-pay

## 2020-07-25 NOTE — Telephone Encounter (Signed)
Pt would like a call back to discuss her test results

## 2020-07-28 LAB — COMPLETE METABOLIC PANEL WITH GFR
AG Ratio: 1.4 (calc) (ref 1.0–2.5)
ALT: 62 U/L — ABNORMAL HIGH (ref 6–29)
AST: 35 U/L — ABNORMAL HIGH (ref 10–30)
Albumin: 4.2 g/dL (ref 3.6–5.1)
Alkaline phosphatase (APISO): 56 U/L (ref 31–125)
BUN: 11 mg/dL (ref 7–25)
CO2: 24 mmol/L (ref 20–32)
Calcium: 9.5 mg/dL (ref 8.6–10.2)
Chloride: 106 mmol/L (ref 98–110)
Creat: 0.87 mg/dL (ref 0.50–1.10)
GFR, Est African American: 101 mL/min/{1.73_m2} (ref >=60)
GFR, Est Non African American: 88 mL/min/{1.73_m2} (ref >=60)
Globulin: 3 g/dL (calc) (ref 1.9–3.7)
Glucose, Bld: 77 mg/dL (ref 65–99)
Potassium: 4.1 mmol/L (ref 3.5–5.3)
Sodium: 138 mmol/L (ref 135–146)
Total Bilirubin: 0.5 mg/dL (ref 0.2–1.2)
Total Protein: 7.2 g/dL (ref 6.1–8.1)

## 2020-07-28 LAB — HEP C AB W/REFL
HEPATITIS C ANTIBODY REFILL$(REFL): NONREACTIVE
SIGNAL TO CUT-OFF: 0.02 (ref <1.00)

## 2020-07-28 LAB — REFLEX TIQ

## 2020-07-28 LAB — TSH: TSH: 1.05 mIU/L

## 2020-07-28 NOTE — Telephone Encounter (Signed)
Respond to via [employer] by Bevelyn Ngo, CMA

## 2020-08-25 ENCOUNTER — Ambulatory Visit: Payer: 59 | Admitting: Family Medicine

## 2020-08-26 NOTE — Patient Instructions (Addendum)
We will continue current treatment plan. I will order a sleep study for evaluation.   Follow up with Dr Felecia Shelling in 6 months   Multiple Sclerosis Multiple sclerosis (MS) is a disease of the brain, spinal cord, and optic nerves (central nervous system). It causes the body's disease-fighting (immune) system to destroy the protective covering (myelin sheath) around nerves in the brain. When this happens, signals (nerve impulses) going to and from the brain and spinal cord do not get sent properly or may not get sent at all. There are several types of MS:  Relapsing-remitting MS. This is the most common type. This causes sudden attacks of symptoms. After an attack, you may recover completely until the next attack, or some symptoms may remain permanently.  Secondary progressive MS. This usually develops after the onset of relapsing-remitting MS. Similar to relapsing-remitting MS, this type also causes sudden attacks of symptoms. Attacks may be less frequent, but symptoms slowly get worse (progress) over time.  Primary progressive MS. This causes symptoms that steadily progress over time. This type of MS does not cause sudden attacks of symptoms. The age of onset of MS varies, but it often develops between 34-47 years of age. MS is a lifelong (chronic) condition. There is no cure, but treatment can help slow down the progression of the disease. What are the causes? The cause of this condition is not known. What increases the risk? You are more likely to develop this condition if:  You are a woman.  You have a relative with MS. However, the condition is not passed from parent to child (inherited).  You have a lack (deficiency) of vitamin D.  You smoke. MS is more common in the [place] than in the [place]. What are the signs or symptoms? Relapsing-remitting and secondary progressive MS cause symptoms to occur in episodes or attacks that may last weeks to months. There  may be long periods between attacks in which there are almost no symptoms. Primary progressive MS causes symptoms to steadily progress after they develop. Symptoms of MS vary because of the many different ways it affects the central nervous system. The main symptoms include:  Vision problems and eye pain.  Numbness.  Weakness.  Inability to move your arms, hands, feet, or legs (paralysis).  Balance problems.  Shaking that you cannot control (tremors).  Muscle spasms.  Problems with thinking (cognitive changes). MS can also cause symptoms that are associated with the disease, but are not always the direct result of an MS attack. They may include:  Inability to control urination or bowel movements (incontinence).  Headaches.  Fatigue.  Inability to tolerate heat.  Emotional changes.  Depression.  Pain. How is this diagnosed? This condition is diagnosed based on:  Your symptoms.  A neurological exam. This involves checking central nervous system function, such as nerve function, reflexes, and coordination.  MRIs of the brain and spinal cord.  Lab tests, including a lumbar puncture that tests the fluid that surrounds the brain and spinal cord (cerebrospinal fluid).  Tests to measure the electrical activity of the brain in response to stimulation (evoked potentials). How is this treated? There is no cure for MS, but medicines can help decrease the number and frequency of attacks and help relieve nuisance symptoms. Treatment options may include:  Medicines that reduce the frequency of attacks. These medicines may be given by injection, by mouth (orally), or through an IV.  Medicines that reduce inflammation (steroids). These may provide short-term  relief of symptoms.  Medicines to help control pain, depression, fatigue, or incontinence.  Vitamin D, if you have a deficiency.  Using devices to help you move around (assistive devices), such as braces, a cane, or a  walker.  Physical therapy to strengthen and stretch your muscles.  Occupational therapy to help you with everyday tasks.  Alternative or complementary treatments such as exercise, massage, or acupuncture. Follow these instructions at home:  Take over-the-counter and prescription medicines only as told by your health care provider.  Do not drive or use heavy machinery while taking prescription pain medicine.  Use assistive devices as recommended by your physical therapist or your health care provider.  Exercise as directed by your health care provider.  Return to your normal activities as told by your health care provider. Ask your health care provider what activities are safe for you.  Reach out for support. Share your feelings with friends, family, or a support group.  Keep all follow-up visits as told by your health care provider and therapists. This is important. Where to find more information  National Multiple Sclerosis Society: https://www.nationalmssociety.org Contact a health care provider if:  You feel depressed.  You develop new pain or numbness.  You have tremors.  You have problems with sexual function. Get help right away if:  You develop paralysis.  You develop numbness.  You have problems with your bladder or bowel function.  You develop double vision.  You lose vision in one or both eyes.  You develop suicidal thoughts.  You develop severe confusion. If you ever feel like you may hurt yourself or others, or have thoughts about taking your own life, get help right away. You can go to your nearest emergency department or call:  Your local emergency services (911 in the [place]  A suicide crisis helpline, such as the [facility] at 519-023-0710. This is open 24 hours a day. Summary  Multiple sclerosis (MS) is a disease of the central nervous system that causes the body's immune system to destroy the protective covering  (myelin sheath) around nerves in the brain.  There are 3 types of MS: relapsing-remitting, secondary progressive, and primary progressive. Relapsing-remitting and secondary progressive MS cause symptoms to occur in episodes or attacks that may last weeks to months. Primary progressive MS causes symptoms to steadily progress after they develop.  There is no cure for MS, but medicines can help decrease the number and frequency of attacks and help relieve nuisance symptoms. Treatment may also include physical or occupational therapy.  If you develop numbness, paralysis, vision problems, or other neurological symptoms, get help right away. This information is not intended to replace advice given to you by your health care provider. Make sure you discuss any questions you have with your health care provider. Document Revised: 10/14/2017 Document Reviewed: 01/10/2017 Elsevier Patient Education  2020 [employer].

## 2020-08-26 NOTE — Progress Notes (Signed)
PATIENT: [name] DOB: July 16, 1986  REASON FOR VISIT: follow up HISTORY FROM: patient  Chief Complaint  Patient presents with  . Follow-up    rm 1  . Multiple Sclerosis    Pt is having no new sx.     HISTORY OF PRESENT ILLNESS: Today 08/27/20 [name] is a 34 y.o. female here today for follow up for RRMS. She continues Tysabri infusions. Last infusion 10/4. MRI brain and cervical spine stable in 02/2020. Labs have been unremarkable. Recent AST/ALT elevation with PCP labs. Hepatitis negative. She was advised to eat well balanced diet and repeat labs at follow up.   Baclofen 10mg  up to 3 times daily. It helps with muscle spasms.  Mood seems fairly stable. She is sleeping fairly well but continues to wake feeling tired. She feels excessively sleepy during the day. She does snore. Mom and dad have sleep apnea on CPAP. She has stopped phentermine due to increased blood pressure readings.   Oxybutynin 10mg  daily help urinary frequency. She was prescribed Myrbetriq but never tried it.   No vision changes. No new or worsening/exacerbating symptoms.   HISTORY: (copied from Dr Garth Bigness note on 02/21/2020)  [name] is a 34 y.o. woman with relapsing remitting MS.       Update 02/21/2020: She feels her MS is stable and she denies any exacerbations.    She is on Tysabri and tolerates. Last MRI was 08/29/2018.  JCV Ab was negative 02/04/2020.    Gait is doing well.   She can go up and down stairs well.   If she is more active she does get winded more.  She is starting to work out more and gained some weight during the pandemic.   She is on phentermine and feels it helps her fatigue some.   She feels strength is fine.   Sometimes she gets muscle cramps in her calves.    Her urinary urgency is helped by oxybutynin.    She has no new vision changes.    Migraines have done better and now occur once a week.  Maxalt helps most of the time.   She is sleeping well most  night.  Update 04/26/2019 (virtual) She is on Tysabri and her infusions are doing well.  Next infusion will be 05/16/2019.  She has had nausea a few times with the recent infusionShe feels her MS hahs been mostly stable.  No exacerbations.   She does note more cramps in her feet/legs.   The foot cramps occur mostly during the day.   Gait and balance are fine.  She will have some tingling at times in her feet, but usually with the cramps.  She has urinary frequency, helped by oxybutynin,   Vision is fine ans she has seen ophtho this year.   Her fatigue is mild some days and more severe some days.   Phentermine has helped fatigue and also helped her lose 15 pounds.     Migraines have done well the past 6 months with no severe headache.  She still gets some milder headaches 2-3 times a week.  She sometimes takes Tylenol with benefit and Ibuprofen 800 mg helps if Tylenol does not.   She is sleeping well most night.   REVIEW OF SYSTEMS: Out of a complete 14 system review of symptoms, the patient complains only of the following symptoms, fatigue, snoring, anxiety, and all other reviewed systems are negative.  ESS: 7 FSS: 36   ALLERGIES: No  Known Allergies  HOME MEDICATIONS: Outpatient Medications Prior to Visit  Medication Sig Dispense Refill  . baclofen (LIORESAL) 10 MG tablet TAKE 1 TABLET BY MOUTH THREE TIMES A DAY (Patient taking differently: as needed. ) 270 tablet 1  . busPIRone (BUSPAR) 5 MG tablet Take 1 tablet (5 mg total) by mouth 3 (three) times daily as needed. 60 tablet 2  . cetirizine (ZYRTEC) 10 MG tablet TAKE 1 TABLET BY MOUTH EVERY DAY AS NEEDED FOR ALLERGY 90 tablet 3  . fluticasone (FLONASE) 50 MCG/ACT nasal spray TWO SPRAYS EACH NOSTRIL ONCE A DAY (Patient taking differently: TWO SPRAYS EACH NOSTRIL ONCE A DAY AS NEEDED) 1 g 0  . ibuprofen (ADVIL,MOTRIN) 800 MG tablet Take 1 tablet (800 mg total) by mouth every 8 (eight) hours as needed (mild pain). 30 tablet 0  . natalizumab  (TYSABRI) 300 MG/15ML injection Inject 300 mg into the vein every 30 (thirty) days.    Marland Kitchen oxybutynin (DITROPAN-XL) 10 MG 24 hr tablet TAKE 1 TABLET BY MOUTH EVERYDAY AT BEDTIME 90 tablet 3  . rizatriptan (MAXALT-MLT) 10 MG disintegrating tablet Take 1 tablet (10 mg total) by mouth as needed for migraine. May repeat in 2 hours if needed 9 tablet 3  . triamterene-hydrochlorothiazide (MAXZIDE-25) 37.5-25 MG tablet Take 1 tablet by mouth daily. 90 tablet 3  . mirabegron ER (MYRBETRIQ) 50 MG TB24 tablet Take 1 tablet (50 mg total) by mouth daily. 30 tablet 0  . phentermine 37.5 MG capsule TAKE 1 CAPSULE BY MOUTH EVERY DAY 30 capsule 5   Facility-Administered Medications Prior to Visit  Medication Dose Route Frequency Provider Last Rate Last Admin  . gadoteridol (PROHANCE) injection 13 mL  13 mL Intravenous Once PRN Melvenia Beam, MD        PAST MEDICAL HISTORY: Past Medical History:  Diagnosis Date  . Allergic rhinitis due to pollen 02/06/2010  . Esophageal reflux 11/29/2008  . HEADACHE, CLUSTER, CHRONIC 02/06/2010   patient reports migraines, not cluster headaches from problem list  . Hypertension   . Multiple sclerosis ([place])   . Neuromuscular disorder ([place])    multiple sclerosis    PAST SURGICAL HISTORY: Past Surgical History:  Procedure Laterality Date  . CESAREAN SECTION  06/29/2012   Procedure: CESAREAN SECTION;  Surgeon: Allena Katz, MD;  Location: [facility];  Service: Obstetrics;  Laterality: N/A;  . IUD REMOVAL  2018  . MYOMECTOMY N/A 02/21/2018   Procedure: MYOMECTOMY, ABDOMINAL;  Surgeon: Molli Posey, MD;  Location: [facility];  Service: Gynecology;  Laterality: N/A;    FAMILY HISTORY: Family History  Problem Relation Age of Onset  . Lung cancer Maternal Grandfather   . Hypertension Mother   . Hypertension Father   . Mental illness Maternal Grandmother   . Diabetes Maternal Grandmother   . Glaucoma Paternal Grandmother   . Hypertension Paternal Grandfather      SOCIAL HISTORY: Social History   Socioeconomic History  . Marital status: Single    Spouse name: Not on file  . Number of children: Not on file  . Years of education: Not on file  . Highest education level: Not on file  Occupational History  . Not on file  Tobacco Use  . Smoking status: Never Smoker  . Smokeless tobacco: Never Used  Vaping Use  . Vaping Use: Never used  Substance and Sexual Activity  . Alcohol use: Yes    Alcohol/week: 0.0 - 1.0 standard drinks    Comment: occ  . Drug use:  No  . Sexual activity: Yes    Birth control/protection: I.U.D.  Other Topics Concern  . Not on file  Social History Narrative   Lives at home with son (born 2013)  and son's father.       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    Prior  Advertising account executive (Pharmacist, community) Garment/textile technologist.        Wears glasses.   Caffeine none.   Social Determinants of Health   Financial Resource Strain:   . Difficulty of Paying Living Expenses: Not on file  Food Insecurity:   . Worried About Charity fundraiser in the Last Year: Not on file  . Ran Out of Food in the Last Year: Not on file  Transportation Needs:   . Lack of Transportation (Medical): Not on file  . Lack of Transportation (Non-Medical): Not on file  Physical Activity:   . Days of Exercise per Week: Not on file  . Minutes of Exercise per Session: Not on file  Stress:   . Feeling of Stress : Not on file  Social Connections:   . Frequency of Communication with Friends and Family: Not on file  . Frequency of Social Gatherings with Friends and Family: Not on file  . Attends Religious Services: Not on file  . Active Member of Clubs or Organizations: Not on file  . Attends Archivist Meetings: Not on file  . Marital Status: Not on file  Intimate Partner Violence:   . Fear of Current or Ex-Partner: Not on file  . Emotionally Abused: Not on file  . Physically Abused: Not on file  . Sexually  Abused: Not on file      PHYSICAL EXAM  Vitals:   08/27/20 1539  BP: 139/90  Pulse: 67  Weight: 199 lb (90.3 kg)  Height: 5\' 5"  (1.651 m)   Body mass index is 33.12 kg/m.  Generalized: Well developed, in no acute distress  Cardiology: normal rate and rhythm, no murmur noted Respiratory: clear to auscultation bilaterally  Mallampati 3+ Neurological examination  Mentation: Alert oriented to time, place, history taking. Follows all commands speech and language fluent Cranial nerve II-XII: Pupils were equal round reactive to light. Extraocular movements were full, visual field were full on confrontational test. Facial sensation and strength were normal. Uvula tongue midline. Head turning and shoulder shrug  were normal and symmetric. Motor: The motor testing reveals 5 over 5 strength of all 4 extremities. Good symmetric motor tone is noted throughout.  Sensory: Sensory testing is intact to soft touch on all 4 extremities. No evidence of extinction is noted.  Coordination: Cerebellar testing reveals good finger-nose-finger and heel-to-shin bilaterally.  Gait and station: Gait is normal.  Reflexes: Deep tendon reflexes are symmetric and normal bilaterally.   DIAGNOSTIC DATA (LABS, IMAGING, TESTING) - I reviewed patient records, labs, notes, testing and imaging myself where available.  No flowsheet data found.   Lab Results  Component Value Date   WBC 8.1 07/17/2020   HGB 11.5 07/17/2020   HCT 34.4 07/17/2020   MCV 85 07/17/2020   PLT 281 07/17/2020      Component Value Date/Time   NA 138 07/24/2020 1342   K 4.1 07/24/2020 1342   CL 106 07/24/2020 1342   CO2 24 07/24/2020 1342   GLUCOSE 77 07/24/2020 1342   BUN 11 07/24/2020 1342   CREATININE 0.87 07/24/2020 1342   CALCIUM 9.5 07/24/2020 1342   PROT 7.2 07/24/2020  1342   ALBUMIN 4.4 12/11/2018 0945   ALBUMIN 3,800 03/31/2016 1024   AST 35 (H) 07/24/2020 1342   ALT 62 (H) 07/24/2020 1342   ALKPHOS 68 12/11/2018 0945    BILITOT 0.5 07/24/2020 1342   GFRNONAA 88 07/24/2020 1342   GFRAA 101 07/24/2020 1342   Lab Results  Component Value Date   CHOL 157 12/11/2018   HDL 54.80 12/11/2018   LDLCALC 88 12/11/2018   TRIG 71.0 12/11/2018   CHOLHDL 3 12/11/2018   No results found for: HGBA1C No results found for: VITAMINB12 Lab Results  Component Value Date   TSH 1.05 07/24/2020       ASSESSMENT AND PLAN 34 y.o. year old female  has a past medical history of Allergic rhinitis due to pollen (02/06/2010), Esophageal reflux (11/29/2008), HEADACHE, CLUSTER, CHRONIC (02/06/2010), Hypertension, Multiple sclerosis ([place]), and Neuromuscular disorder ([place]). here with     ICD-10-CM   1. Relapsing remitting multiple sclerosis ([place])  G35 CANCELED: CBC with Differential/Platelets    CANCELED: Hepatic Function Panel    CANCELED: Stratify JCV Ab (w/ Index) w/ Rflx  2. High risk medication use  Z79.899 CANCELED: CBC with Differential/Platelets    CANCELED: Hepatic Function Panel    CANCELED: Stratify JCV Ab (w/ Index) w/ Rflx  3. Urinary frequency  R35.0   4. Other fatigue  R53.83 Nocturnal polysomnography  5. Migraine with aura and without status migrainosus, not intractable  G43.109   6. Vitamin D deficiency  E55.9 CANCELED: Vitamin D, 25-hydroxy  7. Snoring  R06.83 Nocturnal polysomnography  8. Daytime sleepiness  R40.0 Nocturnal polysomnography    Stefany is doing well, today. No new or exacerbating symptoms. She will continue Tysabri infusions monthly. Labs were stable in 07/2020. JCV negative. MRI stable 02/2020. She will continue baclofen and oxybutynin. Discontinue phentermine for now due to elevated BP. She has concerning symptoms of sleep apnea with snoring, daytime sleepiness and family history of sleep apnea. I will order sleep study for evaluation. Will order CPAP if needed. She was encouraged to focus on healthy lifestyle habits and continue close follow up with PCP. We will fill out accomodation forma for  work. She will follow up in 6 months.    Orders Placed This Encounter  Procedures  . Nocturnal polysomnography    Standing Status:   Future    Standing Expiration Date:   08/27/2021    Order Specific Question:   Where should this test be performed:    Answer:   [place]     No orders of the defined types were placed in this encounter.     I spent 30 minutes with the patient. 50% of this time was spent counseling and educating patient on plan of care and medications.    Cathy Presto, FNP-C 08/27/2020, 4:24 PM [facility] [address] [place], [place] [address] [phone]

## 2020-08-27 ENCOUNTER — Ambulatory Visit (INDEPENDENT_AMBULATORY_CARE_PROVIDER_SITE_OTHER): Payer: 59 | Admitting: Family Medicine

## 2020-08-27 ENCOUNTER — Encounter: Payer: Self-pay | Admitting: Family Medicine

## 2020-08-27 VITALS — BP 139/90 | HR 67 | Ht 65.0 in | Wt 199.0 lb

## 2020-08-27 DIAGNOSIS — R5383 Other fatigue: Secondary | ICD-10-CM | POA: Diagnosis not present

## 2020-08-27 DIAGNOSIS — R400 Somnolence: Secondary | ICD-10-CM

## 2020-08-27 DIAGNOSIS — E559 Vitamin D deficiency, unspecified: Secondary | ICD-10-CM

## 2020-08-27 DIAGNOSIS — Z79899 Other long term (current) drug therapy: Secondary | ICD-10-CM | POA: Diagnosis not present

## 2020-08-27 DIAGNOSIS — G35 Multiple sclerosis: Secondary | ICD-10-CM | POA: Diagnosis not present

## 2020-08-27 DIAGNOSIS — G43109 Migraine with aura, not intractable, without status migrainosus: Secondary | ICD-10-CM

## 2020-08-27 DIAGNOSIS — R350 Frequency of micturition: Secondary | ICD-10-CM

## 2020-08-27 DIAGNOSIS — R0683 Snoring: Secondary | ICD-10-CM

## 2020-08-27 NOTE — Progress Notes (Signed)
I have read the note, and I agree with the clinical assessment and plan.  Magen Suriano A. Gustavo Dispenza, MD, PhD, FAAN Certified in Neurology, Clinical Neurophysiology, Sleep Medicine, Pain Medicine and Neuroimaging  [facility] [address], [address] [place], [place] [address] [phone]  

## 2020-09-04 ENCOUNTER — Telehealth: Payer: Self-pay

## 2020-09-04 NOTE — Telephone Encounter (Signed)
[name] is calling in from [place] stating they received a form about [name] needing to work from home but they are needing a duration period, asked if someone could call them back,

## 2020-09-04 NOTE — Telephone Encounter (Signed)
Can you follow up with pt please and have Dr. Yong Channel fill out and fax it to where pt needs it to go.

## 2020-09-04 NOTE — Telephone Encounter (Signed)
Do you know anything about this? 

## 2020-09-04 NOTE — Telephone Encounter (Signed)
No

## 2020-09-14 ENCOUNTER — Ambulatory Visit (INDEPENDENT_AMBULATORY_CARE_PROVIDER_SITE_OTHER): Payer: 59 | Admitting: Neurology

## 2020-09-14 DIAGNOSIS — G4733 Obstructive sleep apnea (adult) (pediatric): Secondary | ICD-10-CM

## 2020-09-14 DIAGNOSIS — R0683 Snoring: Secondary | ICD-10-CM

## 2020-09-14 DIAGNOSIS — R400 Somnolence: Secondary | ICD-10-CM

## 2020-09-14 DIAGNOSIS — R5383 Other fatigue: Secondary | ICD-10-CM

## 2020-09-15 ENCOUNTER — Telehealth: Payer: Self-pay | Admitting: *Deleted

## 2020-09-15 DIAGNOSIS — G4733 Obstructive sleep apnea (adult) (pediatric): Secondary | ICD-10-CM | POA: Insufficient documentation

## 2020-09-15 DIAGNOSIS — R0683 Snoring: Secondary | ICD-10-CM | POA: Insufficient documentation

## 2020-09-15 DIAGNOSIS — R400 Somnolence: Secondary | ICD-10-CM | POA: Insufficient documentation

## 2020-09-15 NOTE — Progress Notes (Signed)
PATIENT'S NAME:  [name], [name] DOB:      Feb 01, 1986      MR#:    [id number]     DATE OF RECORDING: 09/14/2020 REFERRING M.D.:  Garret Reddish MD Study Performed:   Baseline Polysomnogram HISTORY:  [name] is a 34 y.o. female with MS. She is sleeping fairly well but continues to wake feeling tired. She feels excessively sleepy during the day. She does snore. Mom and dad have sleep apnea on CPAP. She has stopped phentermine due to increased blood pressure readings.    Oxybutynin 10mg  daily help urinary frequency. She was prescribed Myrbetriq but never tried it.    No vision changes. No new or worsening/exacerbating symptoms.    The patient endorsed the Epworth Sleepiness Scale at 7/24 points.    The patient's weight 199 pounds with a height of 65 (inches), resulting in a BMI of 33.1 kg/m2.  The patient's neck circumference measured --- inches.  CURRENT MEDICATIONS: Lioresal, Buspar, Zyrtec, Flonase, Advil, Tysabri, Ditropan-XL, Malaxt, Maxzide, Myrbetriq, phentermine.   PROCEDURE:  This is a multichannel digital polysomnogram utilizing the Somnostar 11.2 system.  Electrodes and sensors were applied and monitored per AASM Specifications.   EEG, EOG, Chin and Limb EMG, were sampled at 200 Hz.  ECG, Snore and Nasal Pressure, Thermal Airflow, Respiratory Effort, CPAP Flow and Pressure, Oximetry was sampled at 50 Hz. Digital video and audio were recorded.      BASELINE STUDY  Lights Out was at 21:41 and Lights On at 05:10.  Total recording time (TRT) was 449 minutes, with a total sleep time (TST) of 423.5 minutes.   The patient's sleep latency was 8.5 minutes.  REM latency was 71 minutes.  The sleep efficiency was 94.3 %.     SLEEP ARCHITECTURE: WASO (Wake after sleep onset) was 19.5 minutes.  There were 5 minutes in Stage N1, 285 minutes Stage N2, 71.5 minutes Stage N3 and 62 minutes in Stage REM.  The percentage of Stage N1 was 1.2%, Stage N2 was 67.3%, Stage N3 was 16.9% and  Stage R (REM sleep) was 14.6%.  The arousals were noted as: 37 were spontaneous, 0 were associated with PLMs, 14 were associated with respiratory events.   RESPIRATORY ANALYSIS:  There were a total of 86 respiratory events:  18 obstructive apneas, 16 central apneas and 0 mixed apneas with a total of 34 apneas and an apnea index (AI) of 4.8 /hour. There were 52 hypopneas with a hypopnea index of 7.4 /hour. The patient also had 0 respiratory event related arousals (RERAs).      The total APNEA/HYPOPNEA INDEX (AHI) was 12.2/hour and the total RESPIRATORY DISTURBANCE INDEX was  12.2 /hour.  33 events occurred in REM sleep and 83 events in NREM. The REM AHI was  31.9 /hour, versus a non-REM AHI of 8.8. The patient spent 260.5 minutes of total sleep time in the supine position and 163 minutes in non-supine.. The supine AHI was 10.1 versus a non-supine AHI of 15.4.  OXYGEN SATURATION & C02:  The Wake baseline 02 saturation was 98%, with the lowest being 81%. Time spent below 89% saturation equaled 14 minutes.  PERIODIC LIMB MOVEMENTS:   The patient had a total of 0 Periodic Limb Movements.  The Periodic Limb Movement (PLM) index was 0 and the PLM Arousal index was 0/hour.  OTHER:   Audio and video analysis did not show any abnormal or unusual movements, behaviors, phonations or vocalizations.   The patient took no bathroom  breaks.   EKG showed normal sinus rhythm (NSR).   IMPRESSION:  1.  Mild overall Obstructive Sleep Apnea (OSA) with an AHI = 12.2/hr but severe REM associated OSA with a REM AHI = 31.9/hr 2. Normal sleep efficiency and sleep architecture    RECOMMENDATIONS:  1. The mild OSA could be treated with weight loss and/or an oral appliance.  If excessive daytime sleepiness is present, consider Auto-PAP 5-20 cm H2O pressure 2. Follow-up with Dr. Felecia Shelling   I certify that I have reviewed the entire raw data recording prior to the issuance of this report in accordance with the Standards of  Accreditation of the [place] [employer] (AASM)   Farha Dano A. Felecia Shelling, MD, PhD, FAAN Certified in Neurology, Clinical Neurophysiology, Sleep Medicine, Pain Medicine and Neuroimaging Director, [place] [facility] [address] [place], [place] [address] [phone]     Demographics and Medical History           Name: [name], [name] Age: 77 BMI: 33.1 Interp Physician: Arlice Colt, MD  DOB: 1986/07/14 Ht-IN: 65 CM: 165 Referred By: Garret Reddish MD  Pt. Tag:  Wt-LB: 199 KG: 90 Tested By: Fredirick Maudlin, RPSGT  Pt. #: [id number] Sex: Female Scored By: Fredirick Maudlin, RPSGT  Bed Tag: Room #1 Race: African American    Sleep Summary    Sleep Time Statistics Minutes Hours    Time in Bed 449    7.5    Total Sleep Time 423.5    7.1    Total Sleep Time NREM 361.5    6.0    Total Sleep Time REM 62    1.0    Sleep Onset 5.5    0.1    Wake After Sleep Onset 19.5    0.3    Wake After Sleep Period 0.5    0.0    Latency Persistent Sleep 8.5    0.1    Sleep Efficiency 94.3 Percent    Lights out 21:41     Lights on 05:10    Sleep Disruption Events Count Index    Arousals 57 8.1    Awakenings 0 0    Arousals + Awakenings 57 8.1    REM Awakenings 0 0.0     Sleep Stage Statistics Wake N1 N2 N3 REM    Percent Stage to SPT 4.4  1.1  64.3  16.1  14.0  Percent   Sleep Period Time in Stage 19.5 5 285 71.5 62 Minutes   Latency to Stage  5.5 9 25  71 Minutes   Percent Stage to TST  1.2 67.3 16.9 14.6 Percent   EKG Summary          EKG Statistics         Heart Rate, Wake 81 BPM  TST Epochs in HR Interval 0 < 29   Heart Rate, Steady Sleep Avg 79 BPM   0 30-59   PAC Events 0 Count   526 60-79   PVC Events 0 Count   319 80-99   Bradycardia 0 Count   2 100-119   Tachycardia 0 Count   0 120-139        0 140-159    NREM REM   0 > 160   Shortest R-R .6 .6       Longest  R-R .9 .9        Respiration Summary  Event Statistics Total  With Arousal  With  Awakening    Count Index  Count Index  Count Index   Apneas, Total 34 4.8  8 1.1   0 0.0    Hypopneas, Total 52 7.4  6 0.9   0 0.0    Apnea + Hypopnea Index 86 12.2   14 2.0   0 0.0    Apneas, Supine 13 3.     Apneas, Non Supine 21 7.7     Hypopneas, Supine 31 7.1     Hypopneas, Non Supine 21 7.7     % Sleep Apnea 1.3 Percent     % Sleep Hypopnea 2. Percent    Oximetry Statistics       SpO2, Mean Wake 98 Percent     SpO2, Minimum 81 Percent     SpO2, Max 98 Percent     SpO2, Mean 94 Percent            Desaturation Index, REM 28.1  Index     Desaturation Index, NREM 8.3  Index     Desaturation Index, Total 11.2  Index             SpO2 Intervals > 89% 80-89% 70-79% 60-69% 50-59% 40-49% 30-39% < 30%  423.5 Percent Sleep Time 93.6 6.4 0 0 0 0 0 0  Body Position Statistics   Back Side L Side R Side Prone    Total Sleep Time   260.5 163.0 130 33 0 Minutes   Percent Time to TST   61.5  38.5  30.7  7.8  0.0  Percent   Number of Events   44 42.0 13 29 0 Count   Number of Apneas   13 21 1 20  0 Count   Number of Hypopneas   31 21 12 9  0 Count   Apnea Index   3.0  7.7  0.5  36.4  0.0  Index   Hypopnea Index   7.1  7.7  5.5  16.4  0.0  Index   Apnea + Hypopnea Index   10.1  15.5  6.0  52.7  0.0  Index  Respiration Events    Non REM, Pre Rx Statistics Non Supine  Supine    Central Mixed Obstr  Central Mixed Obstr   Apneas 11 0 3  0 0 3 Count  Apneas, Minimum SpO2 82 0 86  0 0 85 Percent     Hypopneas 0 0 11  0 0 25 Count  Hypopneas, Minimum SpO2 0 0 86  0 0 88 Percent     Apnea + Hypopneas Index 4.8 0.0 6.1  0.0 0.0 7.5 Index    REM, Pre Rx Statistics Non Supine  Supine    Central Mixed Obstr  Central Mixed Obstr   Apneas 1 0 6  4 0 6 Count  Apneas, Minimum SpO2 81 0 82  82 0 81 Percent     Hypopneas 0 0 10  0 0 6 Count  Hypopneas, Minimum SpO2 0 0 85  0 0 86 Percent      Apnea + Hypopnea Index 2.4 0.0 38.4  6.5 0.0 19.4 Index  Leg Movement Summary    PLM Non REM (Incl. Wake) REM Total    No Arousal Arousal Wake No Arousal Arousal Wake No Arousal Arousal Wake Total   Isolated 5 3 0 1 3 0 6 6 0 12    PLMS 0 0 0 0 0 0 0 0 0 0    Total 5 3 0 1 3 0 6  6 0 12  PLM Statistics PLMS Total     Count Index Count Index    PLM 0 0 12 1.7     PLM with Arousal 0 0 6 0.9     PLM, with Wake 0 0 0 0.0     PLM, Arousal + Wake 0 0.0 6 0.9     PLM, No Arousal 0 0.0  6 0.9     PLM, Non REM 0 0.0  8 1.3     PLM, REM 0 0.0  4 3.9     Technician Comments:  Patient came in for a Baseline PSG sleep study scored at 4%. Patient slept on back and sides. Sleep was observed in all stages. EKG NSR. No nocturia. Patient snored severely. Patient would reach for electronic devices during arousals and would leave media playing during her sleep.

## 2020-09-15 NOTE — Telephone Encounter (Signed)
This was completed and to MR. 09/12/20.

## 2020-10-02 ENCOUNTER — Telehealth: Payer: Self-pay | Admitting: *Deleted

## 2020-10-02 DIAGNOSIS — G4733 Obstructive sleep apnea (adult) (pediatric): Secondary | ICD-10-CM

## 2020-10-02 NOTE — Telephone Encounter (Signed)
I called and spoke to pt.  Relayed that the results of sleep study did show mild OSA.  Recommend weight loss management at this time to avoid cpap at this time.  May consider referral to  Dentist for oral appliance.  She is interested in this.  Did remind her about healthy lifestyle habits .  She appreciated call.

## 2020-10-02 NOTE — Telephone Encounter (Signed)
-----   Message from Debbora Presto, NP sent at 09/30/2020  5:50 PM EST ----- Please let her know that her sleep study did show mild OSA. I have reviewed with Dr Felecia Shelling and would like to have her consider weight management to see if we can avoid CPAP at this time. Another option would be to consider referral to a dentist for consideration of an oral appliance. Remind her to focus on healthy lifestyle habits with well balanced diet and regular exercise.

## 2020-10-07 NOTE — Telephone Encounter (Signed)
Patient called back Oral device is not covered under her insurance . Patient would like a call with next step please . Thanks Hinton Dyer .

## 2020-10-07 NOTE — Telephone Encounter (Signed)
At this time, Dr Felecia Shelling wants to try weight loss. We can always reevaluate at her follow up!

## 2020-10-08 NOTE — Telephone Encounter (Signed)
I called pt to let her know that weightloss is the treatment for now.  She states she has been working out for 2 months and will continue with this route for now.  Has appt in 02-2021 with Dr. Felecia Shelling.

## 2020-10-20 ENCOUNTER — Telehealth: Payer: Self-pay | Admitting: *Deleted

## 2020-10-20 NOTE — Telephone Encounter (Signed)
Faxed completed/signed Tysabri pt status report and reauth questionnaire to [place] [facility] at [phone]. Received confirmation.  Received fax notification from [facility] prescribing program that pt re-auth for Tysabri from 10/20/2020-05/24/2021. Enrollment number: [id number]. Account: [employer]. Site auth number: [id number].

## 2020-11-11 ENCOUNTER — Other Ambulatory Visit: Payer: Self-pay | Admitting: Family Medicine

## 2020-12-18 ENCOUNTER — Other Ambulatory Visit: Payer: Self-pay | Admitting: Family Medicine

## 2020-12-18 ENCOUNTER — Other Ambulatory Visit: Payer: Self-pay | Admitting: Neurology

## 2020-12-18 MED ORDER — PHENTERMINE HCL 37.5 MG PO CAPS: ORAL_CAPSULE | ORAL | 5 refills | Status: DC

## 2020-12-18 NOTE — Addendum Note (Signed)
Addended by: Wyvonnia Lora on: 12/18/2020 11:56 AM   Modules accepted: Orders

## 2020-12-18 NOTE — Telephone Encounter (Addendum)
Called pt back. Advised per AL,NP last OV note, she reported she stopped phentermine d/t increased BP readings. Pt states PCP instructed her to stop for 2 months to see how BP was affected. She was told it was ok to go back on since BP was stable. She restarted this end of December and has been tolerating well. Advised I will send rx to AL,NP to e-scribe for her. She verbalized understanding and appreciation.  Per [place] drug registry, she last refilled 11/11/20 #30. Next follow up 02/25/21.

## 2020-12-18 NOTE — Telephone Encounter (Signed)
Pt. is requesting a refill for Phentermine. She states the pharmacy told her it has been denied.  Pharmacy: [id number]

## 2021-01-20 ENCOUNTER — Other Ambulatory Visit: Payer: Self-pay | Admitting: Family Medicine

## 2021-01-26 ENCOUNTER — Other Ambulatory Visit: Payer: Self-pay | Admitting: Family Medicine

## 2021-02-02 ENCOUNTER — Telehealth: Payer: Self-pay | Admitting: *Deleted

## 2021-02-02 ENCOUNTER — Other Ambulatory Visit: Payer: Self-pay | Admitting: Family Medicine

## 2021-02-02 DIAGNOSIS — G35 Multiple sclerosis: Secondary | ICD-10-CM

## 2021-02-02 NOTE — Telephone Encounter (Signed)
Placed JCV lab in [facility] lock box for routine lab pick up. Results pending. 

## 2021-02-02 NOTE — Addendum Note (Signed)
Addended by: Inis Sizer D on: 02/02/2021 04:59 PM   Modules accepted: Orders

## 2021-02-03 LAB — CBC WITH DIFFERENTIAL/PLATELET
Basophils Absolute: 0 10*3/uL (ref 0.0–0.2)
Basos: 0 %
EOS (ABSOLUTE): 0.6 10*3/uL — ABNORMAL HIGH (ref 0.0–0.4)
Eos: 5 %
Hematocrit: 36.4 % (ref 34.0–46.6)
Hemoglobin: 12.4 g/dL (ref 11.1–15.9)
Immature Grans (Abs): 0 10*3/uL (ref 0.0–0.1)
Immature Granulocytes: 0 %
Lymphocytes Absolute: 6.3 10*3/uL — ABNORMAL HIGH (ref 0.7–3.1)
Lymphs: 57 %
MCH: 28.5 pg (ref 26.6–33.0)
MCHC: 34.1 g/dL (ref 31.5–35.7)
MCV: 84 fL (ref 79–97)
Monocytes Absolute: 1.2 10*3/uL — ABNORMAL HIGH (ref 0.1–0.9)
Monocytes: 11 %
Neutrophils Absolute: 3 10*3/uL (ref 1.4–7.0)
Neutrophils: 27 %
Platelets: 298 10*3/uL (ref 150–450)
RBC: 4.35 x10E6/uL (ref 3.77–5.28)
RDW: 14.2 % (ref 11.7–15.4)
WBC: 11.2 10*3/uL — ABNORMAL HIGH (ref 3.4–10.8)

## 2021-02-11 ENCOUNTER — Encounter: Payer: Self-pay | Admitting: *Deleted

## 2021-02-11 LAB — STRATIFY JCV AB (W/ INDEX) W/ RFLX
Index Value: 0.26 — ABNORMAL HIGH
Stratify JCV (TM) Ab w/Reflex Inhibition: UNDETERMINED — AB

## 2021-02-11 LAB — RFLX STRATIFY JCV (TM) AB INHIBITION: JCV Antibody by Inhibition: NEGATIVE

## 2021-02-13 ENCOUNTER — Other Ambulatory Visit: Payer: Self-pay | Admitting: Family Medicine

## 2021-02-25 ENCOUNTER — Ambulatory Visit: Payer: 59 | Admitting: Neurology

## 2021-02-25 ENCOUNTER — Encounter: Payer: Self-pay | Admitting: Neurology

## 2021-02-25 VITALS — BP 155/94 | HR 98 | Ht 65.0 in | Wt 199.2 lb

## 2021-02-25 DIAGNOSIS — G43109 Migraine with aura, not intractable, without status migrainosus: Secondary | ICD-10-CM

## 2021-02-25 DIAGNOSIS — G4733 Obstructive sleep apnea (adult) (pediatric): Secondary | ICD-10-CM

## 2021-02-25 DIAGNOSIS — R350 Frequency of micturition: Secondary | ICD-10-CM

## 2021-02-25 DIAGNOSIS — Z79899 Other long term (current) drug therapy: Secondary | ICD-10-CM

## 2021-02-25 DIAGNOSIS — G35 Multiple sclerosis: Secondary | ICD-10-CM | POA: Diagnosis not present

## 2021-02-25 DIAGNOSIS — R5383 Other fatigue: Secondary | ICD-10-CM

## 2021-02-25 NOTE — Progress Notes (Signed)
[facility]  PATIENT: [name] DOB: 1986-09-07  REFERRING DOCTOR OR PCP:  Garret Reddish SOURCE: Patient, notes from [facility], lab and other results from [facility], MRI images personally reviewed on PACS  _________________________________   HISTORICAL  CHIEF COMPLAINT:  Chief Complaint  Patient presents with  . Follow-up    RM 12. Last seen 08/27/2020 by AL,NP. On Tysabri for MS. Last infusion: 02/02/21, next: 03/02/21. Last JCV drawn 02/02/21 negative, index: 0.26. No new sx. Tysabri infusions going well.     HISTORY OF PRESENT ILLNESS:  [name] is a 35 y.o. woman with relapsing remitting MS.       Update 02/21/2020: She feels her MS is stable and she denies any exacerbations or new symptoms.    She is on Tysabri and tolerates it well.  Her last MRI (brain and cervical spine) was 02/20/2020.  There were no new lesions.  JCV Ab was negative 02/02/2021.      Gait is doing well.   She can go up and down stairs well.   She can walk a couple miles.   She does feel more tired when she exercises.    Strength is doing well. THe right is mildly stronger and has better endurance.    Sometimes she gets muscle cramps in her calves.  She denies tingling or numbness.    Her urinary urgency is helped by oxybutynin.    She has no new vision changes.    She feels energy is lower in the mornings than it was in the past.  She is sleeping well most night.    Phentermine is not helping fatigue like it used to.   She denies depression but has had more anxiety since returning to the office (works at [employer])  Migraines have done ok  -- 1-2 a week, helped most of the time by Maxalt  t.  She has done her vaccinations and will get the booster later this week.      MS History:    In May, 2017,  she had the onset of right arm numbness and weakness that developed over one day.    She went to the emergency room and an MRI of the brain on 03/28/2016 showed multiple  T2/FLAIR hyperintense foci consistent with multiple sclerosis. She was admitted and received 5 days of IV steroids. While in the hospital she had a contrasted MRI of the brain that showed that 2 of those lesions enhanced consistent with more acute onset. Additionally, she had 2 lesions in the cervical spine, both enhancing. The one at C6 was towards the right and could explain her symptoms. Another one was adjacent to C3-C4, posteriorly. She also had a lumbar puncture. The cerebrospinal fluid shows that there were 9 oligoclonal bands in the CSF which is also consistent with multiple sclerosis. Due to the aggressiveness of her MS, she was started on Tysabri. She is JCV antibody negative (0.25).   In retrospect, in 2011, she had headaches and tremors and had an MRI of the brain. It showed a single T2/FLAIR hyperintense focus in the right periventricular white matter of the parietal lobe.   Follow-up MRI in 2012 did not show any new lesions. The symptoms improved and she had no other symptoms until 2017  Imaging: MRI of the cervical spine 03/11/2020 shows T2 hyperintense foci within the spinal cord posteriorly adjacent to C3-C4 and posterolaterally to the right adjacent to C6.  Minimal disc degenerative changes at C3-C4.  MRI of the brain 03/11/2020 shows Multiple T2/FLAIR hyperintense foci in the hemispheres in a pattern and configuration consistent with chronic demyelinating plaque associated with multiple sclerosis.  None of the foci appear to be acute and they were all present on the 08/29/2018 MRI.  MRI of the brain 08/29/2018 shows T2/FLAIR hyperintense foci in the hemispheres and left pons in a pattern and configuration consistent with chronic demyelinating plaque associated with multiple sclerosis.  None of the foci appear to be acute.  When compared to the MRI from May 2017, there are no new lesions.  Some of the foci that were acute with enhancement in 2017 are smaller on the current study and no longer  enhance.  MRI of the thoracic spine 03/29/2016 was normal    REVIEW OF SYSTEMS: Review of Systems  Constitutional: Positive for malaise/fatigue.  HENT: Negative for congestion and hearing loss.   Eyes: Negative.  Negative for blurred vision, double vision, photophobia and pain.  Respiratory: Negative for cough.   Cardiovascular: Negative for chest pain.  Gastrointestinal: Negative for abdominal pain, nausea and vomiting.  Genitourinary: Positive for frequency and urgency. Negative for dysuria.  Musculoskeletal: Negative for back pain and neck pain.  Skin: Negative for itching and rash.  Neurological: Negative.   Endo/Heme/Allergies: Negative.   Psychiatric/Behavioral: Negative for depression. The patient has insomnia.     ALLERGIES: No Known Allergies  HOME MEDICATIONS:  Current Outpatient Medications:  .  baclofen (LIORESAL) 10 MG tablet, TAKE 1 TABLET BY MOUTH THREE TIMES A DAY (Patient taking differently: as needed.), Disp: 270 tablet, Rfl: 1 .  busPIRone (BUSPAR) 5 MG tablet, TAKE 1 TABLET (5 MG TOTAL) BY MOUTH 3 (THREE) TIMES DAILY AS NEEDED., Disp: 60 tablet, Rfl: 2 .  cetirizine (ZYRTEC) 10 MG tablet, TAKE 1 TABLET BY MOUTH EVERY DAY AS NEEDED FOR ALLERGY, Disp: 90 tablet, Rfl: 0 .  fluticasone (FLONASE) 50 MCG/ACT nasal spray, TWO SPRAYS EACH NOSTRIL ONCE A DAY, Disp: 1 g, Rfl: 0 .  ibuprofen (ADVIL,MOTRIN) 800 MG tablet, Take 1 tablet (800 mg total) by mouth every 8 (eight) hours as needed (mild pain)., Disp: 30 tablet, Rfl: 0 .  natalizumab (TYSABRI) 300 MG/15ML injection, Inject 300 mg into the vein every 30 (thirty) days., Disp: , Rfl:  .  oxybutynin (DITROPAN-XL) 10 MG 24 hr tablet, TAKE 1 TABLET BY MOUTH EVERYDAY AT BEDTIME, Disp: 90 tablet, Rfl: 3 .  phentermine 37.5 MG capsule, TAKE 1 CAPSULE BY MOUTH EVERY DAY, Disp: 30 capsule, Rfl: 5 .  rizatriptan (MAXALT-MLT) 10 MG disintegrating tablet, TAKE 1 TABLET BY MOUTH AS NEEDED FOR MIGRAINE. MAY REPEAT IN 2 HOURS IF  NEEDED, Disp: 9 tablet, Rfl: 3 .  triamterene-hydrochlorothiazide (MAXZIDE-25) 37.5-25 MG tablet, TAKE 1 TABLET BY MOUTH EVERY DAY, Disp: 30 tablet, Rfl: 2 No current facility-administered medications for this visit.  Facility-Administered Medications Ordered in Other Visits:  .  gadoteridol (PROHANCE) injection 13 mL, 13 mL, Intravenous, Once PRN, Melvenia Beam, MD  PAST MEDICAL HISTORY: Past Medical History:  Diagnosis Date  . Allergic rhinitis due to pollen 02/06/2010  . Esophageal reflux 11/29/2008  . HEADACHE, CLUSTER, CHRONIC 02/06/2010   patient reports migraines, not cluster headaches from problem list  . Hypertension   . Multiple sclerosis ([place])   . Neuromuscular disorder ([place])    multiple sclerosis    PAST SURGICAL HISTORY: Past Surgical History:  Procedure Laterality Date  . CESAREAN SECTION  06/29/2012   Procedure: CESAREAN SECTION;  Surgeon: Shon Millet II,  MD;  Location: [facility];  Service: Obstetrics;  Laterality: N/A;  . IUD REMOVAL  2018  . MYOMECTOMY N/A 02/21/2018   Procedure: MYOMECTOMY, ABDOMINAL;  Surgeon: Molli Posey, MD;  Location: [facility];  Service: Gynecology;  Laterality: N/A;    FAMILY HISTORY: Family History  Problem Relation Age of Onset  . Lung cancer Maternal Grandfather   . Hypertension Mother   . Hypertension Father   . Mental illness Maternal Grandmother   . Diabetes Maternal Grandmother   . Glaucoma Paternal Grandmother   . Hypertension Paternal Grandfather     SOCIAL HISTORY:  Social History   Socioeconomic History  . Marital status: Single    Spouse name: Not on file  . Number of children: Not on file  . Years of education: Not on file  . Highest education level: Not on file  Occupational History  . Not on file  Tobacco Use  . Smoking status: Never Smoker  . Smokeless tobacco: Never Used  Vaping Use  . Vaping Use: Never used  Substance and Sexual Activity  . Alcohol use: Yes    Alcohol/week: 0.0 - 1.0 standard  drinks    Comment: occ  . Drug use: No  . Sexual activity: Yes    Birth control/protection: I.U.D.  Other Topics Concern  . Not on file  Social History Narrative   Lives at home with son (born 2013)  and son's father.       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    Prior  Advertising account executive (Pharmacist, community) Garment/textile technologist.        Wears glasses.   Caffeine none.   Social Determinants of Health   Financial Resource Strain: Not on file  Food Insecurity: Not on file  Transportation Needs: Not on file  Physical Activity: Not on file  Stress: Not on file  Social Connections: Not on file  Intimate Partner Violence: Not on file     PHYSICAL EXAM  Vitals:   02/25/21 1458  BP: (!) 155/94  Pulse: 98  Weight: 199 lb 3.2 oz (90.4 kg)  Height: 5\' 5"  (1.651 m)    Body mass index is 33.15 kg/m.   General: The patient is well-developed and well-nourished and in no acute distress  Extremities:    No rash or edema.  Neurologic Exam  Mental status: The patient is alert and oriented x 3 at the time of the examination. The patient has apparent normal recent and remote memory, with an apparently normal attention span and concentration ability.   Speech is normal.  Cranial nerves: Extraocular movements are full.  Facial strength was normal.. No obvious hearing deficits are noted.  Motor: Muscle bulk, tone and strength are normal.  Sensory: Sensory testing is intact to touch and vibration sensation in the arms and legs.   Coordination: Cerebellar testing reveals good finger-nose-finger and heel-to-shin bilaterally.  Gait and station: The station is normal and the gait is normal.  The tandem gait is slightly wide.  Romberg is negative.  Reflexes: Deep tendon reflexes are normal and symmetric bilaterally     DIAGNOSTIC DATA (LABS, IMAGING, TESTING) - I reviewed patient records, labs, notes, testing and imaging myself where available.  Lab Results   Component Value Date   WBC 11.2 (H) 02/02/2021   HGB 12.4 02/02/2021   HCT 36.4 02/02/2021   MCV 84 02/02/2021   PLT 298 02/02/2021      Component Value Date/Time   NA 138  07/24/2020 1342   K 4.1 07/24/2020 1342   CL 106 07/24/2020 1342   CO2 24 07/24/2020 1342   GLUCOSE 77 07/24/2020 1342   BUN 11 07/24/2020 1342   CREATININE 0.87 07/24/2020 1342   CALCIUM 9.5 07/24/2020 1342   PROT 7.2 07/24/2020 1342   ALBUMIN 4.4 12/11/2018 0945   ALBUMIN 3,800 03/31/2016 1024   AST 35 (H) 07/24/2020 1342   ALT 62 (H) 07/24/2020 1342   ALKPHOS 68 12/11/2018 0945   BILITOT 0.5 07/24/2020 1342   GFRNONAA 88 07/24/2020 1342   GFRAA 101 07/24/2020 1342    Lab Results  Component Value Date   TSH 1.05 07/24/2020   _________________________________________________________________ Assessment and Plan:  Multiple sclerosis (HCC)  OSA (obstructive sleep apnea)  High risk medication use  Urinary frequency  Migraine with aura and without status migrainosus, not intractable  Other fatigue   1.  Continue Tysabri.  We need to check a JCV antibody titer and CBC when she comes in for her next infusion.  She has been JCV antibody negative in the past. 2.  Stay active and exercise as tolerated. 3.   Stop the phentermine.  However, if, in retrospect, it was helping  we could renew phentermine for fatigue and weight loss. 4.   Continue baclofen as needed.  Continue oxybutynin for bladder.  5.   She will return to see me in 6 months or sooner if there are new or worsening neurologic symptoms.   Talayeh Bruinsma A. Felecia Shelling, MD, PhD 08/04/1659, 6:00 PM Certified in Neurology, Clinical Neurophysiology, Sleep Medicine, Pain Medicine and Neuroimaging  [facility] [address], [place], [place] [address] [phone]

## 2021-03-01 ENCOUNTER — Other Ambulatory Visit: Payer: Self-pay

## 2021-03-01 ENCOUNTER — Encounter (HOSPITAL_COMMUNITY): Payer: Self-pay

## 2021-03-01 ENCOUNTER — Ambulatory Visit (HOSPITAL_COMMUNITY)
Admission: EM | Admit: 2021-03-01 | Discharge: 2021-03-01 | Disposition: A | Discharge status: Home / Self Care | Payer: 59 | Attending: Urgent Care | Admitting: Urgent Care

## 2021-03-01 DIAGNOSIS — S91311A Laceration without foreign body, right foot, initial encounter: Secondary | ICD-10-CM | POA: Diagnosis not present

## 2021-03-01 DIAGNOSIS — M79671 Pain in right foot: Secondary | ICD-10-CM | POA: Diagnosis not present

## 2021-03-01 DIAGNOSIS — Z23 Encounter for immunization: Secondary | ICD-10-CM | POA: Diagnosis not present

## 2021-03-01 MED ORDER — TETANUS-DIPHTH-ACELL PERTUSSIS 5-2.5-18.5 LF-MCG/0.5 IM SUSY
0.5000 mL | PREFILLED_SYRINGE | Freq: Once | INTRAMUSCULAR | Status: AC
  Administered 2021-03-01: 0.5 mL via INTRAMUSCULAR

## 2021-03-01 MED ORDER — TETANUS-DIPHTH-ACELL PERTUSSIS 5-2.5-18.5 LF-MCG/0.5 IM SUSY
PREFILLED_SYRINGE | INTRAMUSCULAR | Status: AC
  Filled 2021-03-01: qty 0.5

## 2021-03-01 MED ORDER — LIDOCAINE-EPINEPHRINE 1 %-1:100000 IJ SOLN
INTRAMUSCULAR | Status: AC
  Filled 2021-03-01: qty 1

## 2021-03-01 NOTE — Discharge Instructions (Signed)

## 2021-03-01 NOTE — ED Triage Notes (Signed)
Pt presents with laceration to right foot from glass shattering and cutting her foot this morning.

## 2021-03-01 NOTE — ED Provider Notes (Signed)
[place]   MRN: [id number] DOB: Mar 22, 1986  Subjective:   [name] is a 35 y.o. female presenting for suffering a right foot laceration today.  States that there was glass that dropped and ended up cutting her foot accidentally.  She cleaned her wound and applied a bandage before coming to our clinic.  Cannot recall her last Tdap.  No current facility-administered medications for this encounter.  Current Outpatient Medications:  .  baclofen (LIORESAL) 10 MG tablet, TAKE 1 TABLET BY MOUTH THREE TIMES A DAY (Patient taking differently: as needed.), Disp: 270 tablet, Rfl: 1 .  busPIRone (BUSPAR) 5 MG tablet, TAKE 1 TABLET (5 MG TOTAL) BY MOUTH 3 (THREE) TIMES DAILY AS NEEDED., Disp: 60 tablet, Rfl: 2 .  cetirizine (ZYRTEC) 10 MG tablet, TAKE 1 TABLET BY MOUTH EVERY DAY AS NEEDED FOR ALLERGY, Disp: 90 tablet, Rfl: 0 .  fluticasone (FLONASE) 50 MCG/ACT nasal spray, TWO SPRAYS EACH NOSTRIL ONCE A DAY, Disp: 1 g, Rfl: 0 .  ibuprofen (ADVIL,MOTRIN) 800 MG tablet, Take 1 tablet (800 mg total) by mouth every 8 (eight) hours as needed (mild pain)., Disp: 30 tablet, Rfl: 0 .  natalizumab (TYSABRI) 300 MG/15ML injection, Inject 300 mg into the vein every 30 (thirty) days., Disp: , Rfl:  .  oxybutynin (DITROPAN-XL) 10 MG 24 hr tablet, TAKE 1 TABLET BY MOUTH EVERYDAY AT BEDTIME, Disp: 90 tablet, Rfl: 3 .  phentermine 37.5 MG capsule, TAKE 1 CAPSULE BY MOUTH EVERY DAY, Disp: 30 capsule, Rfl: 5 .  rizatriptan (MAXALT-MLT) 10 MG disintegrating tablet, TAKE 1 TABLET BY MOUTH AS NEEDED FOR MIGRAINE. MAY REPEAT IN 2 HOURS IF NEEDED, Disp: 9 tablet, Rfl: 3 .  triamterene-hydrochlorothiazide (MAXZIDE-25) 37.5-25 MG tablet, TAKE 1 TABLET BY MOUTH EVERY DAY, Disp: 30 tablet, Rfl: 2  Facility-Administered Medications Ordered in Other Encounters:  .  gadoteridol (PROHANCE) injection 13 mL, 13 mL, Intravenous, Once PRN, Melvenia Beam, MD   No Known Allergies  Past Medical  History:  Diagnosis Date  . Allergic rhinitis due to pollen 02/06/2010  . Esophageal reflux 11/29/2008  . HEADACHE, CLUSTER, CHRONIC 02/06/2010   patient reports migraines, not cluster headaches from problem list  . Hypertension   . Multiple sclerosis ([place])   . Neuromuscular disorder ([place])    multiple sclerosis     Past Surgical History:  Procedure Laterality Date  . CESAREAN SECTION  06/29/2012   Procedure: CESAREAN SECTION;  Surgeon: Allena Katz, MD;  Location: [facility];  Service: Obstetrics;  Laterality: N/A;  . IUD REMOVAL  2018  . MYOMECTOMY N/A 02/21/2018   Procedure: MYOMECTOMY, ABDOMINAL;  Surgeon: Molli Posey, MD;  Location: [facility];  Service: Gynecology;  Laterality: N/A;    Family History  Problem Relation Age of Onset  . Lung cancer Maternal Grandfather   . Hypertension Mother   . Hypertension Father   . Mental illness Maternal Grandmother   . Diabetes Maternal Grandmother   . Glaucoma Paternal Grandmother   . Hypertension Paternal Grandfather     Social History   Tobacco Use  . Smoking status: Never Smoker  . Smokeless tobacco: Never Used  Vaping Use  . Vaping Use: Never used  Substance Use Topics  . Alcohol use: Yes    Alcohol/week: 0.0 - 1.0 standard drinks    Comment: occ  . Drug use: No    ROS   Objective:   Vitals: BP (!) 170/104 (BP Location: Right Arm) Comment: did not  take medication  Pulse 74   Temp 99.1 F (37.3 C) (Oral)   Resp 18   SpO2 99%   Physical Exam Constitutional:      General: She is not in acute distress.    Appearance: Normal appearance. She is well-developed. She is not ill-appearing, toxic-appearing or diaphoretic.  HENT:     Head: Normocephalic and atraumatic.     Nose: Nose normal.     Mouth/Throat:     Mouth: Mucous membranes are moist.     Pharynx: Oropharynx is clear.  Eyes:     General: No scleral icterus.       Right eye: No discharge.        Left eye: No discharge.     Extraocular Movements:  Extraocular movements intact.     Conjunctiva/sclera: Conjunctivae normal.     Pupils: Pupils are equal, round, and reactive to light.  Cardiovascular:     Rate and Rhythm: Normal rate.  Pulmonary:     Effort: Pulmonary effort is normal.  Skin:    General: Skin is warm and dry.       Neurological:     General: No focal deficit present.     Mental Status: She is alert and oriented to person, place, and time.     Motor: No weakness.     Coordination: Coordination normal.     Gait: Gait normal.     Deep Tendon Reflexes: Reflexes normal.  Psychiatric:        Mood and Affect: Mood normal.        Behavior: Behavior normal.        Thought Content: Thought content normal.        Judgment: Judgment normal.     Tdap updated in clinic today.  PROCEDURE NOTE: laceration repair Verbal consent obtained from patient.  Local anesthesia with 3cc Lidocaine 1% with epinephrine.  Wound explored for tendon, ligament damage. Wound scrubbed with soap and water and rinsed. Wound closed with #3 4-0 Ethilon (simple interrupted) sutures.  Wound cleansed and dressed.   Assessment and Plan :   PDMP not reviewed this encounter.  1. Right foot pain   2. Laceration of right foot, initial encounter   3. Need for diphtheria-tetanus-pertussis (Tdap) vaccine     Laceration repaired successfully. Wound care reviewed. Recommended Tylenol and/or ibuprofen for pain control. Return-to-clinic precautions discussed, patient verbalized understanding. Otherwise, follow up in 10 days for suture removal.     Jaynee Eagles, PA-C 03/01/21 1219

## 2021-03-12 NOTE — Patient Instructions (Addendum)
poor control of blood pressure at home on triamterene hydrochlorothiazide 37.5-25 mg daily, we opted to add low-dose amlodipine 2.5 mg.  She should remain off the phentermine.  Recommended follow-up within a month to recheck -goal <135/85 at home  Let us know if any issues with the foot . Look out for redness, warmth, swelling. Can adjust as needed for comfort with bandaid vs wrap. Ok to be barefoot in home if careful   Recommended follow up: Return in about 1 month (around 04/12/2021) for follow up- or sooner if needed.

## 2021-03-12 NOTE — Progress Notes (Signed)
Phone [phone] In person visit   Subjective:   [name] is a 35 y.o. year old very pleasant female patient who presents for/with See problem oriented charting Chief Complaint  Patient presents with  . Suture / Staple Removal    This visit occurred during the SARS-CoV-2 public health emergency.  Safety protocols were in place, including screening questions prior to the visit, additional usage of staff PPE, and extensive cleaning of exam room while observing appropriate contact time as indicated for disinfecting solutions.   Past Medical History-  Patient Active Problem List   Diagnosis Date Noted  . Multiple sclerosis ([place]) 04/09/2016    Priority: High  . Anxiety 08/25/2018    Priority: Medium  . Essential hypertension 04/09/2016    Priority: Medium  . Migraine 03/29/2016    Priority: Medium  . High risk medication use 04/09/2016    Priority: Low  . Allergic rhinitis due to pollen 02/06/2010    Priority: Low  . Snoring 09/15/2020  . Daytime sleepiness 09/15/2020  . OSA (obstructive sleep apnea) 09/15/2020  . Immunosuppressed status ([place]) 07/24/2020  . Leiomyoma 02/21/2018  . Abnormal brain MRI 04/16/2016  . Numbness 04/09/2016  . Urinary frequency 04/09/2016  . Other fatigue 04/09/2016    Medications- reviewed and updated Current Outpatient Medications  Medication Sig Dispense Refill  . amLODipine (NORVASC) 2.5 MG tablet Take 1 tablet (2.5 mg total) by mouth daily. 90 tablet 3  . baclofen (LIORESAL) 10 MG tablet TAKE 1 TABLET BY MOUTH THREE TIMES A DAY (Patient taking differently: as needed.) 270 tablet 1  . busPIRone (BUSPAR) 5 MG tablet TAKE 1 TABLET (5 MG TOTAL) BY MOUTH 3 (THREE) TIMES DAILY AS NEEDED. 60 tablet 2  . cetirizine (ZYRTEC) 10 MG tablet TAKE 1 TABLET BY MOUTH EVERY DAY AS NEEDED FOR ALLERGY 90 tablet 0  . fluticasone (FLONASE) 50 MCG/ACT nasal spray TWO SPRAYS EACH NOSTRIL ONCE A DAY 1 g 0  . ibuprofen (ADVIL,MOTRIN) 800 MG tablet Take  1 tablet (800 mg total) by mouth every 8 (eight) hours as needed (mild pain). 30 tablet 0  . natalizumab (TYSABRI) 300 MG/15ML injection Inject 300 mg into the vein every 30 (thirty) days.    Marland Kitchen oxybutynin (DITROPAN-XL) 10 MG 24 hr tablet TAKE 1 TABLET BY MOUTH EVERYDAY AT BEDTIME 90 tablet 3  . rizatriptan (MAXALT-MLT) 10 MG disintegrating tablet TAKE 1 TABLET BY MOUTH AS NEEDED FOR MIGRAINE. MAY REPEAT IN 2 HOURS IF NEEDED 9 tablet 3  . triamterene-hydrochlorothiazide (MAXZIDE-25) 37.5-25 MG tablet TAKE 1 TABLET BY MOUTH EVERY DAY 30 tablet 2   No current facility-administered medications for this visit.   Facility-Administered Medications Ordered in Other Visits  Medication Dose Route Frequency Provider Last Rate Last Admin  . gadoteridol (PROHANCE) injection 13 mL  13 mL Intravenous Once PRN Melvenia Beam, MD         Objective:  BP 140/90   Pulse 86   Temp 98.1 F (36.7 C) (Temporal)   Ht 5\' 5"  (1.651 m)   Wt 201 lb 6.4 oz (91.4 kg)   BMI 33.51 kg/m  Gen: NAD, resting comfortably Ext: no edema Skin: warm, dry, 3 sutures removed from right midfoot- slight separation at top layer     Assessment and Plan   # Foot laceration/ right foot pain S:dropped a piece of glass on her foot about 12 days ago at [employer]- was not attached to the piece as expected and fell on foot. Required 3 sutures at  urgent care. Tdap was updated.   Some swelling in the foot but not bad. Not a lot of pain A/P: still some sensitivity after cut but wound itself appears to have healed- top layer with perhaps mild separation with pulling. I think its reasonable to continue bandaid when active- or wrapping for comfort if needed. Still want to avoid contaminants. Wash with soapy water at least once a day    #Hypertension S: Compliant with triamterene hydrochlorothiazide 37.5-25 mg. -avoiding phentermine and BP still running slightly high - home readings ranging from 140-150 BP Readings from Last 3  Encounters:  03/13/21 140/90  03/01/21 (!) 170/104  02/25/21 (!) 155/94  A/P: poor control of blood pressure at home on triamterene hydrochlorothiazide 37.5-25 mg daily, we opted to add low-dose amlodipine 2.5 mg.  She should remain off the phentermine.  Recommended follow-up within a month to recheck  #obesity- working with trainer from exercise perspective- has seen big transition in body fat percentage (less MS flares too!). Has bene on phentermine in past but affects BP. We discussed Dr. Marjorie Smolder diet and myfitnesspal- shes honestly done a great job overall- wants further help- refer to healthy weight to wellness Wt Readings from Last 3 Encounters:  03/13/21 201 lb 6.4 oz (91.4 kg)  02/25/21 199 lb 3.2 oz (90.4 kg)  08/27/20 199 lb (90.3 kg)   Recommended follow up: Return in about 1 month (around 04/12/2021) for follow up- or sooner if needed. Future Appointments  Date Time Provider [place]  08/27/2021  2:30 PM Lomax, Amy, NP [facility] None    Lab/Order associations:   ICD-10-CM   1. Essential hypertension  I10   2. Obesity (BMI 30-39.9)  E66.9 Amb Ref to Medical Weight Management  3. Laceration of right foot, initial encounter  S91.311A     Meds ordered this encounter  Medications  . amLODipine (NORVASC) 2.5 MG tablet    Sig: Take 1 tablet (2.5 mg total) by mouth daily.    Dispense:  90 tablet    Refill:  3   Return precautions advised.  Garret Reddish, MD

## 2021-03-13 ENCOUNTER — Encounter: Payer: Self-pay | Admitting: Family Medicine

## 2021-03-13 ENCOUNTER — Ambulatory Visit: Payer: 59 | Admitting: Family Medicine

## 2021-03-13 ENCOUNTER — Other Ambulatory Visit: Payer: Self-pay

## 2021-03-13 VITALS — BP 140/90 | HR 86 | Temp 98.1°F | Ht 65.0 in | Wt 201.4 lb

## 2021-03-13 DIAGNOSIS — I10 Essential (primary) hypertension: Secondary | ICD-10-CM | POA: Diagnosis not present

## 2021-03-13 DIAGNOSIS — E669 Obesity, unspecified: Secondary | ICD-10-CM | POA: Diagnosis not present

## 2021-03-13 DIAGNOSIS — S91311A Laceration without foreign body, right foot, initial encounter: Secondary | ICD-10-CM | POA: Diagnosis not present

## 2021-03-13 MED ORDER — AMLODIPINE BESYLATE 2.5 MG PO TABS
2.5000 mg | ORAL_TABLET | Freq: Every day | ORAL | 3 refills | Status: DC
Start: 2021-03-13 — End: 2021-05-26

## 2021-04-10 ENCOUNTER — Ambulatory Visit: Payer: 59 | Admitting: Family Medicine

## 2021-04-17 ENCOUNTER — Ambulatory Visit (INDEPENDENT_AMBULATORY_CARE_PROVIDER_SITE_OTHER): Payer: 59 | Admitting: Family Medicine

## 2021-04-17 ENCOUNTER — Other Ambulatory Visit: Payer: Self-pay

## 2021-04-17 ENCOUNTER — Encounter: Payer: Self-pay | Admitting: Family Medicine

## 2021-04-17 VITALS — BP 118/86 | HR 81 | Temp 98.1°F | Ht 65.0 in | Wt 200.6 lb

## 2021-04-17 DIAGNOSIS — I10 Essential (primary) hypertension: Secondary | ICD-10-CM | POA: Diagnosis not present

## 2021-04-17 DIAGNOSIS — D849 Immunodeficiency, unspecified: Secondary | ICD-10-CM | POA: Diagnosis not present

## 2021-04-17 NOTE — Patient Instructions (Addendum)
Blood pressure looks much better-continue amlodipine 2.5 mg in addition toTriamterene hydrochlorothiazide 37.5-25 mg  Hope you have a great summer!  Recommended follow up: 6-12 months for physical

## 2021-04-17 NOTE — Progress Notes (Signed)
Phone [phone] In person visit   Subjective:   [name] is a 35 y.o. year old very pleasant female patient who presents for/with See problem oriented charting Chief Complaint  Patient presents with  . Hypertension   This visit occurred during the SARS-CoV-2 public health emergency.  Safety protocols were in place, including screening questions prior to the visit, additional usage of staff PPE, and extensive cleaning of exam room while observing appropriate contact time as indicated for disinfecting solutions.   Past Medical History-  Patient Active Problem List   Diagnosis Date Noted  . Multiple sclerosis ([place]) 04/09/2016    Priority: High  . Anxiety 08/25/2018    Priority: Medium  . Essential hypertension 04/09/2016    Priority: Medium  . Migraine 03/29/2016    Priority: Medium  . High risk medication use 04/09/2016    Priority: Low  . Allergic rhinitis due to pollen 02/06/2010    Priority: Low  . Snoring 09/15/2020  . Daytime sleepiness 09/15/2020  . OSA (obstructive sleep apnea) 09/15/2020  . Immunosuppressed status ([place]) 07/24/2020  . Leiomyoma 02/21/2018  . Abnormal brain MRI 04/16/2016  . Numbness 04/09/2016  . Urinary frequency 04/09/2016  . Other fatigue 04/09/2016    Medications- reviewed and updated Current Outpatient Medications  Medication Sig Dispense Refill  . amLODipine (NORVASC) 2.5 MG tablet Take 1 tablet (2.5 mg total) by mouth daily. 90 tablet 3  . baclofen (LIORESAL) 10 MG tablet TAKE 1 TABLET BY MOUTH THREE TIMES A DAY (Patient taking differently: as needed.) 270 tablet 1  . busPIRone (BUSPAR) 5 MG tablet TAKE 1 TABLET (5 MG TOTAL) BY MOUTH 3 (THREE) TIMES DAILY AS NEEDED. 60 tablet 2  . cetirizine (ZYRTEC) 10 MG tablet TAKE 1 TABLET BY MOUTH EVERY DAY AS NEEDED FOR ALLERGY 90 tablet 0  . fluticasone (FLONASE) 50 MCG/ACT nasal spray TWO SPRAYS EACH NOSTRIL ONCE A DAY 1 g 0  . ibuprofen (ADVIL,MOTRIN) 800 MG tablet Take 1 tablet (800  mg total) by mouth every 8 (eight) hours as needed (mild pain). 30 tablet 0  . natalizumab (TYSABRI) 300 MG/15ML injection Inject 300 mg into the vein every 30 (thirty) days.    Marland Kitchen oxybutynin (DITROPAN-XL) 10 MG 24 hr tablet TAKE 1 TABLET BY MOUTH EVERYDAY AT BEDTIME 90 tablet 3  . rizatriptan (MAXALT-MLT) 10 MG disintegrating tablet TAKE 1 TABLET BY MOUTH AS NEEDED FOR MIGRAINE. MAY REPEAT IN 2 HOURS IF NEEDED 9 tablet 3  . triamterene-hydrochlorothiazide (MAXZIDE-25) 37.5-25 MG tablet TAKE 1 TABLET BY MOUTH EVERY DAY 30 tablet 2   No current facility-administered medications for this visit.   Facility-Administered Medications Ordered in Other Visits  Medication Dose Route Frequency Provider Last Rate Last Admin  . gadoteridol (PROHANCE) injection 13 mL  13 mL Intravenous Once PRN Melvenia Beam, MD         Objective:  BP 118/86   Pulse 81   Temp 98.1 F (36.7 C) (Temporal)   Ht 5\' 5"  (1.651 m)   Wt 200 lb 9.6 oz (91 kg)   SpO2 98%   BMI 33.38 kg/m  Gen: NAD, resting comfortably Foot: Slightly raised 1 cm area over prior laceration-well-healed scar-very mild tenderness to palpation-no surrounding erythema, warmth    Assessment and Plan  #SocialUpdates: Headed to [place] later today!  #Foot laceration/right foot pain- S: Patient dropped a piece of glass in her foot in mid April requiring 3 sutures.  Tdap was updated.  She was having some swelling  of her foot last visit but pain was minimal. Continues to have some sensitivity-but it is healed up  A/P: This is largely healed up-perhaps small sensitivity- she may keloid in this area unfortunately   #Hypertension S: Compliant with triamterene hydrochlorothiazide 37.5-25 mg with addition amlodipine 2.5 mg last visit-we also discussed remaining off phentermine. The first week she felt lightheadedness and nausea but this has resolved-no other issues in the past few weeks.  At Home BP #: Averages 140s/90s  BP Readings from Last 3  Encounters:  04/17/21 118/86  03/13/21 140/90  03/01/21 (!) 170/104  A/P: Stable problem. Continue current medications. Tolerating Amlodipine addition-we discussed with her healthy lifestyle changes if she starts to notice lightheadedness or decreasing blood pressures to let me know- could likely decrease to 1.25 mg amlodipine  # Obesity S: Patient has been on phentermine but we were concerned this could be raising her blood pressure.  Last visit we discussed that Dr. Darrall Dears diet and my fitness pal options.  We also did a referral to healthy weight to wellness-appears that left a voicemail to schedule. Continues to work out and maintain a healthy diet.  Wt Readings from Last 3 Encounters:  04/17/21 200 lb 9.6 oz (91 kg)  03/13/21 201 lb 6.4 oz (91.4 kg)  02/25/21 199 lb 3.2 oz (90.4 kg)  A/P: Patient has done a great job with healthy lifestyles changes overall-congratulated her on her efforts.  Down from peak weight of 214 in early 2021  % Multiple sclerosis-immunosuppressed status S: Patient is doing reasonably well on Tysabri. Continues to follow with Dr. Felecia Shelling. A/P: Patient continues to do well.  Immunosuppressed status noted on Tysabri-she recently received her third COVID-19 vaccination   Recommended follow up: 6 to 43-month physical recommended Future Appointments  Date Time Provider [place]  08/27/2021  2:30 PM Debbora Presto, NP [facility] None  10/23/2021 10:40 AM Yong Channel Brayton Mars, MD [facility]    Lab/Order associations:   ICD-10-CM   1. Essential hypertension  I10   2. Immunosuppressed status ([place]) Chronic D84.9    I,Jordan Kelly,acting as a scribe for Garret Reddish, MD.,have documented all relevant documentation on the behalf of Garret Reddish, MD,as directed by  Garret Reddish, MD while in the presence of Garret Reddish, MD.   I, Garret Reddish, MD, have reviewed all documentation for this visit. The documentation on 04/17/21 for the exam, diagnosis,  procedures, and orders are all accurate and complete.   Return precautions advised.  Garret Reddish, MD

## 2021-04-20 ENCOUNTER — Telehealth: Payer: Self-pay | Admitting: *Deleted

## 2021-04-20 NOTE — Telephone Encounter (Signed)
Faxed completed/signed Tysabri pt status report and reauth questionnaire to [facility] at [phone]. Received confirmation.   Received fax notification from [facility] prescribing program that pt re-auth for Tysabri from 04/20/2021-11/23/2021. Enrollment number: [id number]. Account: [facility]. Site auth number: [id number].

## 2021-04-28 ENCOUNTER — Other Ambulatory Visit: Payer: Self-pay | Admitting: Family Medicine

## 2021-05-12 ENCOUNTER — Other Ambulatory Visit: Payer: Self-pay | Admitting: Neurology

## 2021-05-26 ENCOUNTER — Other Ambulatory Visit: Payer: Self-pay

## 2021-05-26 MED ORDER — AMLODIPINE BESYLATE 2.5 MG PO TABS: 2.5000 mg | ORAL_TABLET | Freq: Every day | ORAL | 3 refills | Status: DC

## 2021-05-26 MED ORDER — TRIAMTERENE-HCTZ 37.5-25 MG PO TABS: 1.0000 | ORAL_TABLET | Freq: Every day | ORAL | 3 refills | Status: DC

## 2021-06-25 ENCOUNTER — Other Ambulatory Visit: Payer: Self-pay | Admitting: *Deleted

## 2021-06-25 MED ORDER — OXYBUTYNIN CHLORIDE ER 10 MG PO TB24: ORAL_TABLET | ORAL | 2 refills | Status: DC

## 2021-07-29 ENCOUNTER — Other Ambulatory Visit: Payer: Self-pay | Admitting: Neurology

## 2021-07-30 ENCOUNTER — Other Ambulatory Visit: Payer: Self-pay

## 2021-07-30 MED ORDER — BACLOFEN 10 MG PO TABS: 10.0000 mg | ORAL_TABLET | Freq: Three times a day (TID) | ORAL | 0 refills | Status: DC

## 2021-08-18 ENCOUNTER — Other Ambulatory Visit: Payer: Self-pay | Admitting: Family Medicine

## 2021-08-19 ENCOUNTER — Other Ambulatory Visit (INDEPENDENT_AMBULATORY_CARE_PROVIDER_SITE_OTHER): Payer: Self-pay

## 2021-08-19 ENCOUNTER — Telehealth: Payer: Self-pay | Admitting: Neurology

## 2021-08-19 ENCOUNTER — Other Ambulatory Visit: Payer: Self-pay | Admitting: Neurology

## 2021-08-19 DIAGNOSIS — Z79899 Other long term (current) drug therapy: Secondary | ICD-10-CM

## 2021-08-19 DIAGNOSIS — Z0289 Encounter for other administrative examinations: Secondary | ICD-10-CM

## 2021-08-19 DIAGNOSIS — G35 Multiple sclerosis: Secondary | ICD-10-CM

## 2021-08-19 NOTE — Telephone Encounter (Signed)
Placed JCV lab in [facility] lock box for routine lab pick up. Results pending. 

## 2021-08-20 LAB — CBC WITH DIFFERENTIAL/PLATELET
Basophils Absolute: 0.1 10*3/uL (ref 0.0–0.2)
Basos: 1 %
EOS (ABSOLUTE): 0.2 10*3/uL (ref 0.0–0.4)
Eos: 2 %
Hematocrit: 35.5 % (ref 34.0–46.6)
Hemoglobin: 12 g/dL (ref 11.1–15.9)
Immature Grans (Abs): 0.1 10*3/uL (ref 0.0–0.1)
Immature Granulocytes: 1 %
Lymphocytes Absolute: 5.1 10*3/uL — ABNORMAL HIGH (ref 0.7–3.1)
Lymphs: 44 %
MCH: 28.2 pg (ref 26.6–33.0)
MCHC: 33.8 g/dL (ref 31.5–35.7)
MCV: 83 fL (ref 79–97)
Monocytes Absolute: 1.1 10*3/uL — ABNORMAL HIGH (ref 0.1–0.9)
Monocytes: 10 %
NRBC: 1 % — ABNORMAL HIGH (ref 0–0)
Neutrophils Absolute: 4.7 10*3/uL (ref 1.4–7.0)
Neutrophils: 42 %
Platelets: 288 10*3/uL (ref 150–450)
RBC: 4.26 x10E6/uL (ref 3.77–5.28)
RDW: 14.4 % (ref 11.7–15.4)
WBC: 11.4 10*3/uL — ABNORMAL HIGH (ref 3.4–10.8)

## 2021-08-26 NOTE — Patient Instructions (Signed)
Below is our plan:  We will continue current treatment plan. Let me know if headaches worsen. We can consider adding topiramate or propranolol if needed.   Please make sure you are staying well hydrated. I recommend 50-60 ounces daily. Well balanced diet and regular exercise encouraged. Consistent sleep schedule with 6-8 hours recommended.   Please continue follow up with care team as directed.   Follow up with Dr Felecia Shelling in 6 months   You may receive a survey regarding today's visit. I encourage you to leave honest feed back as I do use this information to improve patient care. Thank you for seeing me today!

## 2021-08-26 NOTE — Progress Notes (Signed)
PATIENT: [name] DOB: 12-24-1985  REASON FOR VISIT: follow up HISTORY FROM: patient  Chief Complaint  Patient presents with   Follow-up    Rm 2, alone. Here for 6 month migraine and MS f/u, on Tysabri. Migraines have increased. Rizatriptan helps but reaches her max limit. R leg weakness for a week, last week.       HISTORY OF PRESENT ILLNESS: 08/27/21 ALL:  [name] is a 35 y.o. female here today for follow up for RRMS. She continues Tysabri infusions. Last infusion 10/5. MRI brain and cervical spine stable in 02/2020. Labs have been unremarkable, JCV from 10/5 pending.   She is doing fairly well, today. She denies changes in gait. She continues to work oit at the gym four days a week. She did have some left leg weakness last week but resolved with baclofen and rest. She usually does not need to take baclofen every day.  It helps with muscle spasms. No numbness/tingling. Mood seems fairly stable. She is sleeping well. Headaches have been a little more frequent over the past month. Unsure if this is related to weather changes. Rizatriptan works well but she had to use her whole prescription last month, usually only needs 4-5 per month.   Oxybutynin 10mg  daily helps with urinary frequency. No vision changes.   02/25/2021 RS: She feels her MS is stable and she denies any exacerbations or new symptoms.    She is on Tysabri and tolerates it well.  Her last MRI (brain and cervical spine) was 02/20/2020.  There were no new lesions.  JCV Ab was negative 02/02/2021.       Gait is doing well.   She can go up and down stairs well.   She can walk a couple miles.   She does feel more tired when she exercises.    Strength is doing well. THe right is mildly stronger and has better endurance.    Sometimes she gets muscle cramps in her calves.  She denies tingling or numbness.    Her urinary urgency is helped by oxybutynin.    She has no new vision changes.     She feels energy is  lower in the mornings than it was in the past.  She is sleeping well most night.    Phentermine is not helping fatigue like it used to.   She denies depression but has had more anxiety since returning to the office (works at [employer])   Migraines have done ok  -- 1-2 a week, helped most of the time by Maxalt  t.   She has done her vaccinations and will get the booster later this week.       MS History:    In May, 2017,  she had the onset of right arm numbness and weakness that developed over one day.    She went to the emergency room and an MRI of the brain on 03/28/2016 showed multiple T2/FLAIR hyperintense foci consistent with multiple sclerosis. She was admitted and received 5 days of IV steroids. While in the hospital she had a contrasted MRI of the brain that showed that 2 of those lesions enhanced consistent with more acute onset. Additionally, she had 2 lesions in the cervical spine, both enhancing. The one at C6 was towards the right and could explain her symptoms. Another one was adjacent to C3-C4, posteriorly. She also had a lumbar puncture. The cerebrospinal fluid shows that there were 9 oligoclonal bands  in the CSF which is also consistent with multiple sclerosis. Due to the aggressiveness of her MS, she was started on Tysabri. She is JCV antibody negative (0.25).   In retrospect, in 2011, she had headaches and tremors and had an MRI of the brain. It showed a single T2/FLAIR hyperintense focus in the right periventricular white matter of the parietal lobe.   Follow-up MRI in 2012 did not show any new lesions. The symptoms improved and she had no other symptoms until 2017   Imaging: MRI of the cervical spine 03/11/2020 shows T2 hyperintense foci within the spinal cord posteriorly adjacent to C3-C4 and posterolaterally to the right adjacent to C6.  Minimal disc degenerative changes at C3-C4.   MRI of the brain 03/11/2020 shows Multiple T2/FLAIR hyperintense foci in the hemispheres in a  pattern and configuration consistent with chronic demyelinating plaque associated with multiple sclerosis.  None of the foci appear to be acute and they were all present on the 08/29/2018 MRI.   MRI of the brain 08/29/2018 shows T2/FLAIR hyperintense foci in the hemispheres and left pons in a pattern and configuration consistent with chronic demyelinating plaque associated with multiple sclerosis.  None of the foci appear to be acute.  When compared to the MRI from May 2017, there are no new lesions.  Some of the foci that were acute with enhancement in 2017 are smaller on the current study and no longer enhance.   MRI of the thoracic spine 03/29/2016 was normal   REVIEW OF SYSTEMS: Out of a complete 14 system review of symptoms, the patient complains only of the following symptoms, fatigue, snoring, anxiety, and all other reviewed systems are negative.   ALLERGIES: No Known Allergies  HOME MEDICATIONS: Outpatient Medications Prior to Visit  Medication Sig Dispense Refill   amLODipine (NORVASC) 2.5 MG tablet Take 1 tablet (2.5 mg total) by mouth daily. 90 tablet 3   baclofen (LIORESAL) 10 MG tablet Take 1 tablet (10 mg total) by mouth 3 (three) times daily. 270 tablet 0   busPIRone (BUSPAR) 5 MG tablet TAKE 1 TABLET (5 MG TOTAL) BY MOUTH 3 (THREE) TIMES DAILY AS NEEDED. 60 tablet 2   cetirizine (ZYRTEC) 10 MG tablet TAKE 1 TABLET BY MOUTH EVERY DAY AS NEEDED FOR ALLERGY 30 tablet 2   fluticasone (FLONASE) 50 MCG/ACT nasal spray TWO SPRAYS EACH NOSTRIL ONCE A DAY 1 g 0   ibuprofen (ADVIL,MOTRIN) 800 MG tablet Take 1 tablet (800 mg total) by mouth every 8 (eight) hours as needed (mild pain). 30 tablet 0   natalizumab (TYSABRI) 300 MG/15ML injection Inject 300 mg into the vein every 30 (thirty) days.     oxybutynin (DITROPAN-XL) 10 MG 24 hr tablet TAKE 1 TABLET BY MOUTH EVERYDAY AT BEDTIME 90 tablet 2   rizatriptan (MAXALT-MLT) 10 MG disintegrating tablet TAKE 1 TABLET BY MOUTH AS NEEDED FOR  MIGRAINE. MAY REPEAT IN 2 HOURS IF NEEDED 9 tablet 3   triamterene-hydrochlorothiazide (MAXZIDE-25) 37.5-25 MG tablet Take 1 tablet by mouth daily. 90 tablet 3   Facility-Administered Medications Prior to Visit  Medication Dose Route Frequency Provider Last Rate Last Admin   gadoteridol (PROHANCE) injection 13 mL  13 mL Intravenous Once PRN Melvenia Beam, MD        PAST MEDICAL HISTORY: Past Medical History:  Diagnosis Date   Allergic rhinitis due to pollen 02/06/2010   Esophageal reflux 11/29/2008   HEADACHE, CLUSTER, CHRONIC 02/06/2010   patient reports migraines, not cluster headaches from problem list  Hypertension    Multiple sclerosis ([place])    Neuromuscular disorder ([place])    multiple sclerosis    PAST SURGICAL HISTORY: Past Surgical History:  Procedure Laterality Date   CESAREAN SECTION  06/29/2012   Procedure: CESAREAN SECTION;  Surgeon: Allena Katz, MD;  Location: [facility];  Service: Obstetrics;  Laterality: N/A;   IUD REMOVAL  2018   MYOMECTOMY N/A 02/21/2018   Procedure: MYOMECTOMY, ABDOMINAL;  Surgeon: Molli Posey, MD;  Location: [facility];  Service: Gynecology;  Laterality: N/A;    FAMILY HISTORY: Family History  Problem Relation Age of Onset   Lung cancer Maternal Grandfather    Hypertension Mother    Hypertension Father    Mental illness Maternal Grandmother    Diabetes Maternal Grandmother    Glaucoma Paternal Grandmother    Hypertension Paternal Grandfather     SOCIAL HISTORY: Social History   Socioeconomic History   Marital status: Single    Spouse name: Not on file   Number of children: Not on file   Years of education: Not on file   Highest education level: Not on file  Occupational History   Not on file  Tobacco Use   Smoking status: Never   Smokeless tobacco: Never  Vaping Use   Vaping Use: Never used  Substance and Sexual Activity   Alcohol use: Yes    Alcohol/week: 0.0 - 1.0 standard drinks    Comment: occ   Drug use: No    Sexual activity: Yes    Birth control/protection: I.U.D.  Other Topics Concern   Not on file  Social History Narrative   Lives at home with son (born 2013)  and son's father.       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    Prior  Advertising account executive (Pharmacist, community) Garment/textile technologist.        Wears glasses.   Caffeine none.   Social Determinants of Health   Financial Resource Strain: Not on file  Food Insecurity: Not on file  Transportation Needs: Not on file  Physical Activity: Not on file  Stress: Not on file  Social Connections: Not on file  Intimate Partner Violence: Not on file      PHYSICAL EXAM  Vitals:   08/27/21 1435  BP: 127/80  Pulse: 83  Weight: 209 lb 8 oz (95 kg)  Height: 5\' 5"  (1.651 m)    Body mass index is 34.86 kg/m.  Generalized: Well developed, in no acute distress  Cardiology: normal rate and rhythm, no murmur noted Respiratory: clear to auscultation bilaterally  Mallampati 3+ Neurological examination  Mentation: Alert oriented to time, place, history taking. Follows all commands speech and language fluent Cranial nerve II-XII: Pupils were equal round reactive to light. Extraocular movements were full, visual field were full on confrontational test. Facial sensation and strength were normal. Uvula tongue midline. Head turning and shoulder shrug  were normal and symmetric. Motor: The motor testing reveals 5 over 5 strength of all 4 extremities. Good symmetric motor tone is noted throughout.  Sensory: Sensory testing is intact to soft touch on all 4 extremities. No evidence of extinction is noted.  Coordination: Cerebellar testing reveals good finger-nose-finger and heel-to-shin bilaterally.  Gait and station: Gait is normal.  Reflexes: Deep tendon reflexes are symmetric and normal bilaterally.   DIAGNOSTIC DATA (LABS, IMAGING, TESTING) - I reviewed patient records, labs, notes, testing and imaging myself where available.  No  flowsheet data found.  Lab Results  Component Value Date   WBC 11.4 (H) 08/19/2021   HGB 12.0 08/19/2021   HCT 35.5 08/19/2021   MCV 83 08/19/2021   PLT 288 08/19/2021      Component Value Date/Time   NA 138 07/24/2020 1342   K 4.1 07/24/2020 1342   CL 106 07/24/2020 1342   CO2 24 07/24/2020 1342   GLUCOSE 77 07/24/2020 1342   BUN 11 07/24/2020 1342   CREATININE 0.87 07/24/2020 1342   CALCIUM 9.5 07/24/2020 1342   PROT 7.2 07/24/2020 1342   ALBUMIN 4.4 12/11/2018 0945   ALBUMIN 3,800 03/31/2016 1024   AST 35 (H) 07/24/2020 1342   ALT 62 (H) 07/24/2020 1342   ALKPHOS 68 12/11/2018 0945   BILITOT 0.5 07/24/2020 1342   GFRNONAA 88 07/24/2020 1342   GFRAA 101 07/24/2020 1342   Lab Results  Component Value Date   CHOL 157 12/11/2018   HDL 54.80 12/11/2018   LDLCALC 88 12/11/2018   TRIG 71.0 12/11/2018   CHOLHDL 3 12/11/2018   No results found for: HGBA1C No results found for: VITAMINB12 Lab Results  Component Value Date   TSH 1.05 07/24/2020       ASSESSMENT AND PLAN 35 y.o. year old female  has a past medical history of Allergic rhinitis due to pollen (02/06/2010), Esophageal reflux (11/29/2008), HEADACHE, CLUSTER, CHRONIC (02/06/2010), Hypertension, Multiple sclerosis ([employer]), and Neuromuscular disorder ([place]). here with     ICD-10-CM   1. Relapsing remitting multiple sclerosis ([place])  G35     2. High risk medication use  Z79.899     3. Migraine with aura and without status migrainosus, not intractable  G43.109     4. Urinary frequency  R35.0     5. Other fatigue  R53.83        Noreta is doing well, today. No new or exacerbating symptoms. She will continue Tysabri infusions monthly. Labs were stable in 08/19/2021. JCV pending. MRI stable 02/2020. She will continue baclofen and oxybutynin. Continue rizatriptan for migraines. May add topiramate if frequency worsens. She was encouraged to focus on healthy lifestyle habits and continue close follow up with PCP. She  will follow up in 6 months.    No orders of the defined types were placed in this encounter.    No orders of the defined types were placed in this encounter.     Debbora Presto, FNP-C 08/27/2021, 2:43 PM [facility] [address], [place], [place] [address] [phone]

## 2021-08-27 ENCOUNTER — Ambulatory Visit (INDEPENDENT_AMBULATORY_CARE_PROVIDER_SITE_OTHER): Payer: 59 | Admitting: Family Medicine

## 2021-08-27 ENCOUNTER — Encounter: Payer: Self-pay | Admitting: Family Medicine

## 2021-08-27 VITALS — BP 127/80 | HR 83 | Ht 65.0 in | Wt 209.5 lb

## 2021-08-27 DIAGNOSIS — G35 Multiple sclerosis: Secondary | ICD-10-CM | POA: Diagnosis not present

## 2021-08-27 DIAGNOSIS — R5383 Other fatigue: Secondary | ICD-10-CM

## 2021-08-27 DIAGNOSIS — Z79899 Other long term (current) drug therapy: Secondary | ICD-10-CM | POA: Diagnosis not present

## 2021-08-27 DIAGNOSIS — G43109 Migraine with aura, not intractable, without status migrainosus: Secondary | ICD-10-CM | POA: Diagnosis not present

## 2021-08-27 DIAGNOSIS — R350 Frequency of micturition: Secondary | ICD-10-CM | POA: Diagnosis not present

## 2021-09-21 ENCOUNTER — Encounter: Payer: Self-pay | Admitting: Family Medicine

## 2021-09-21 ENCOUNTER — Telehealth (INDEPENDENT_AMBULATORY_CARE_PROVIDER_SITE_OTHER): Payer: 59 | Admitting: Family Medicine

## 2021-09-21 VITALS — Temp 102.0°F | Ht 65.0 in | Wt 208.0 lb

## 2021-09-21 DIAGNOSIS — I10 Essential (primary) hypertension: Secondary | ICD-10-CM

## 2021-09-21 DIAGNOSIS — U071 COVID-19: Secondary | ICD-10-CM

## 2021-09-21 MED ORDER — MOLNUPIRAVIR EUA 200MG CAPSULE: 4.0000 | ORAL_CAPSULE | Freq: Two times a day (BID) | ORAL | 0 refills | Status: AC

## 2021-09-21 NOTE — Progress Notes (Signed)
Phone [phone] Virtual visit via Video note   Subjective:  Chief complaint: Chief Complaint  Patient presents with   Covid Positive    Pt states she tested positive for covid this morning. Pt c/o cough and chills with body aches and nasal congestion, severe headache. Pt has tried OTC robutussin and honey for the cough.    This visit type was conducted due to national recommendations for restrictions regarding the COVID-19 Pandemic (e.g. social distancing).  This format is felt to be most appropriate for this patient at this time balancing risks to patient and risks to population by having him in for in person visit.  No physical exam was performed (except for noted visual exam or audio findings with Telehealth visits).    Our team/I connected with Everlena Cooper at 11:40 AM EST by a video enabled telemedicine application (doxy.me or caregility through epic) and verified that I am speaking with the correct person using two identifiers.  Location patient: [facility] Location provider: [facility], office Persons participating in the virtual visit:  patient  Our team/I discussed the limitations of evaluation and management by telemedicine and the availability of in person appointments. In light of current covid-19 pandemic, patient also understands that we are trying to protect them by minimizing in office contact if at all possible.  The patient expressed consent for telemedicine visit and agreed to proceed. Patient understands insurance will be billed.   Past Medical History-  Patient Active Problem List   Diagnosis Date Noted   Relapsing remitting multiple sclerosis ([place]) 04/09/2016    Priority: High   Anxiety 08/25/2018    Priority: Medium    Essential hypertension 04/09/2016    Priority: Medium    Migraine 03/29/2016    Priority: Medium    High risk medication use 04/09/2016    Priority: Low   Allergic rhinitis due to pollen 02/06/2010    Priority: Low   Snoring  09/15/2020   Daytime sleepiness 09/15/2020   OSA (obstructive sleep apnea) 09/15/2020   Immunosuppressed status ([place]) 07/24/2020   Leiomyoma 02/21/2018   Abnormal brain MRI 04/16/2016   Numbness 04/09/2016   Urinary frequency 04/09/2016   Other fatigue 04/09/2016    Medications- reviewed and updated Current Outpatient Medications  Medication Sig Dispense Refill   amLODipine (NORVASC) 2.5 MG tablet Take 1 tablet (2.5 mg total) by mouth daily. 90 tablet 3   baclofen (LIORESAL) 10 MG tablet Take 1 tablet (10 mg total) by mouth 3 (three) times daily. 270 tablet 0   busPIRone (BUSPAR) 5 MG tablet TAKE 1 TABLET (5 MG TOTAL) BY MOUTH 3 (THREE) TIMES DAILY AS NEEDED. 60 tablet 2   cetirizine (ZYRTEC) 10 MG tablet TAKE 1 TABLET BY MOUTH EVERY DAY AS NEEDED FOR ALLERGY 30 tablet 2   fluticasone (FLONASE) 50 MCG/ACT nasal spray TWO SPRAYS EACH NOSTRIL ONCE A DAY 1 g 0   ibuprofen (ADVIL,MOTRIN) 800 MG tablet Take 1 tablet (800 mg total) by mouth every 8 (eight) hours as needed (mild pain). 30 tablet 0   molnupiravir EUA (LAGEVRIO) 200 mg CAPS capsule Take 4 capsules (800 mg total) by mouth 2 (two) times daily for 5 days. 40 capsule 0   natalizumab (TYSABRI) 300 MG/15ML injection Inject 300 mg into the vein every 30 (thirty) days.     oxybutynin (DITROPAN-XL) 10 MG 24 hr tablet TAKE 1 TABLET BY MOUTH EVERYDAY AT BEDTIME 90 tablet 2   rizatriptan (MAXALT-MLT) 10 MG disintegrating tablet TAKE 1 TABLET BY MOUTH  AS NEEDED FOR MIGRAINE. MAY REPEAT IN 2 HOURS IF NEEDED 9 tablet 3   triamterene-hydrochlorothiazide (MAXZIDE-25) 37.5-25 MG tablet Take 1 tablet by mouth daily. 90 tablet 3   No current facility-administered medications for this visit.   Facility-Administered Medications Ordered in Other Visits  Medication Dose Route Frequency Provider Last Rate Last Admin   gadoteridol (PROHANCE) injection 13 mL  13 mL Intravenous Once PRN Melvenia Beam, MD         Objective:  Temp (!) 102 F (38.9 C)    Ht 5\' 5"  (1.651 m)   Wt 208 lb (94.3 kg)   BMI 34.61 kg/m  self reported vitals Gen: NAD, resting comfortably at home Lungs: nonlabored, normal respiratory rate Skin: appears dry, no obvious rash     Assessment and Plan   # COVID + S:   Covid Positive    Pt states she tested positive for covid this morning. Started yesterday almost like allergies- some congestion and runny nose. Hit her harder around 3 Am this morning- body aches, short of breath, feels exhausted. Pt c/o cough and chills and nasal congestion, severe headache. Pt has tried OTC robutussin and honey for the cough. No asthma history. Fever up to 102 today fluctuates  Still on tysabri so immunosuppressed     A/P: Patient with testing confirming covid 19 with first day of covid 19 symptoms 09/20/21 Vaccination status:3 total with last vaccination in April. Has not had bivalent booster in last month or two.  Has not had covid yet.   Therefore: - recommended patient watch closely for shortness of breath or confusion or worsening symptoms and if those occur patient should contact us immediately or seek care in the emergency department -recommended patient consider purchasing pulse oximeter and if levels 94% or below persistently- seek care at the hospital - Patient needs to self isolate  for at least 10 days (potentially up to 20 and could use negative testing decision tree to end isolation)since first symptom AND at least 24 hours fever free without fever reducing medications AND have improvement in respiratory symptoms  due to being immunocompromised -Patient should inform close contacts about exposure (anyone patient been around unmasked for more than 15 minutes)   As she is high risk for complications as immunosuppressed on Tysabri-we discussed outpatient therapeutic options including paxlovid (and risk of rebound), molnupiravir, MAB infusion - patient opted for molnupiravir-lowest overlap with her current medications and  we are still seeing good outcomes on outpatient basis  #hypertension S: Compliant with triamterene hydrochlorothiazide 37.5-25 mg with addition amlodipine 2.5 mg last visit BP Readings from Last 3 Encounters:  08/27/21 127/80  04/17/21 118/86  03/13/21 140/90  A/P: Has been well controlled-slight risk of increasing blood pressure on paxlovid which also push [place] towards molnupiravir-continue current medication  Recommended follow up: planned December visit- can recheck BP at that time Future Appointments  Date Time Provider [place]  10/23/2021 10:40 AM Marin Olp, MD [facility]  02/25/2022  3:00 PM Sater, Nanine Means, MD [facility] None    Lab/Order associations:   ICD-10-CM   1. COVID-19  U07.1     2. Essential hypertension  I10       Meds ordered this encounter  Medications   molnupiravir EUA (LAGEVRIO) 200 mg CAPS capsule    Sig: Take 4 capsules (800 mg total) by mouth 2 (two) times daily for 5 days.    Dispense:  40 capsule    Refill:  0  I,Jada Bradford,acting as a scribe for Garret Reddish, MD.,have documented all relevant documentation on the behalf of Garret Reddish, MD,as directed by  Garret Reddish, MD while in the presence of Garret Reddish, MD.  I, Garret Reddish, MD, have reviewed all documentation for this visit. The documentation on 09/21/21 for the exam, diagnosis, procedures, and orders are all accurate and complete.  Return precautions advised.  Garret Reddish, MD

## 2021-10-16 NOTE — Progress Notes (Incomplete)
Phone [phone]   Subjective:  Patient presents today for their annual physical. Chief complaint-noted.   See problem oriented charting- ROS- full  review of systems was completed and negative except for: ***  The following were reviewed and entered/updated in epic: Past Medical History:  Diagnosis Date   Allergic rhinitis due to pollen 02/06/2010   Esophageal reflux 11/29/2008   HEADACHE, CLUSTER, CHRONIC 02/06/2010   patient reports migraines, not cluster headaches from problem list   Hypertension    Multiple sclerosis ([place])    Neuromuscular disorder ([place])    multiple sclerosis   Patient Active Problem List   Diagnosis Date Noted   Snoring 09/15/2020   Daytime sleepiness 09/15/2020   OSA (obstructive sleep apnea) 09/15/2020   Immunosuppressed status ([place]) 07/24/2020   Anxiety 08/25/2018   Leiomyoma 02/21/2018   Abnormal brain MRI 04/16/2016   Relapsing remitting multiple sclerosis ([place]) 04/09/2016   High risk medication use 04/09/2016   Numbness 04/09/2016   Urinary frequency 04/09/2016   Other fatigue 04/09/2016   Essential hypertension 04/09/2016   Migraine 03/29/2016   Allergic rhinitis due to pollen 02/06/2010   Past Surgical History:  Procedure Laterality Date   CESAREAN SECTION  06/29/2012   Procedure: CESAREAN SECTION;  Surgeon: Allena Katz, MD;  Location: [facility];  Service: Obstetrics;  Laterality: N/A;   IUD REMOVAL  2018   MYOMECTOMY N/A 02/21/2018   Procedure: MYOMECTOMY, ABDOMINAL;  Surgeon: Molli Posey, MD;  Location: [facility];  Service: Gynecology;  Laterality: N/A;    Family History  Problem Relation Age of Onset   Lung cancer Maternal Grandfather    Hypertension Mother    Hypertension Father    Mental illness Maternal Grandmother    Diabetes Maternal Grandmother    Glaucoma Paternal Grandmother    Hypertension Paternal Grandfather     Medications- reviewed and updated Current Outpatient Medications  Medication Sig Dispense  Refill   amLODipine (NORVASC) 2.5 MG tablet Take 1 tablet (2.5 mg total) by mouth daily. 90 tablet 3   baclofen (LIORESAL) 10 MG tablet Take 1 tablet (10 mg total) by mouth 3 (three) times daily. 270 tablet 0   busPIRone (BUSPAR) 5 MG tablet TAKE 1 TABLET (5 MG TOTAL) BY MOUTH 3 (THREE) TIMES DAILY AS NEEDED. 60 tablet 2   cetirizine (ZYRTEC) 10 MG tablet TAKE 1 TABLET BY MOUTH EVERY DAY AS NEEDED FOR ALLERGY 30 tablet 2   fluticasone (FLONASE) 50 MCG/ACT nasal spray TWO SPRAYS EACH NOSTRIL ONCE A DAY 1 g 0   ibuprofen (ADVIL,MOTRIN) 800 MG tablet Take 1 tablet (800 mg total) by mouth every 8 (eight) hours as needed (mild pain). 30 tablet 0   natalizumab (TYSABRI) 300 MG/15ML injection Inject 300 mg into the vein every 30 (thirty) days.     oxybutynin (DITROPAN-XL) 10 MG 24 hr tablet TAKE 1 TABLET BY MOUTH EVERYDAY AT BEDTIME 90 tablet 2   rizatriptan (MAXALT-MLT) 10 MG disintegrating tablet TAKE 1 TABLET BY MOUTH AS NEEDED FOR MIGRAINE. MAY REPEAT IN 2 HOURS IF NEEDED 9 tablet 3   triamterene-hydrochlorothiazide (MAXZIDE-25) 37.5-25 MG tablet Take 1 tablet by mouth daily. 90 tablet 3   No current facility-administered medications for this visit.   Facility-Administered Medications Ordered in Other Visits  Medication Dose Route Frequency Provider Last Rate Last Admin   gadoteridol (PROHANCE) injection 13 mL  13 mL Intravenous Once PRN Melvenia Beam, MD        Allergies-reviewed and updated No Known Allergies  Social History   Social History Narrative   Lives at home with son (born 2013)  and son's father.       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    Prior  Advertising account executive (Pharmacist, community) Garment/textile technologist.        Wears glasses.   Caffeine none.   Objective  Objective:  There were no vitals taken for this visit. Gen: NAD, resting comfortably HEENT: Mucous membranes are moist. Oropharynx normal Neck: no thyromegaly CV: RRR no murmurs rubs or  gallops Lungs: CTAB no crackles, wheeze, rhonchi Abdomen: soft/nontender/nondistended/normal bowel sounds. No rebound or guarding.  Ext: no edema Skin: warm, dry Neuro: grossly normal, moves all extremities, PERRLA***   Assessment and Plan   35 y.o. female presenting for annual physical.  Health Maintenance counseling: 1. Anticipatory guidance: Patient counseled regarding regular dental exams ***q6 months, eye exams ***,  avoiding smoking and second hand smoke*** , limiting alcohol to 1 beverage per day*** . No illicit drugs - ***  2. Risk factor reduction:  Advised patient of need for regular exercise and diet rich and fruits and vegetables to reduce risk of heart attack and stroke. Exercise- ***. Diet-***.  Wt Readings from Last 3 Encounters:  09/21/21 208 lb (94.3 kg)  08/27/21 209 lb 8 oz (95 kg)  04/17/21 200 lb 9.6 oz (91 kg)   3. Immunizations/screenings/ancillary studies DISCUSSED:  -COVID booster vaccine #4 - *** Immunization History  Administered Date(s) Administered   Influenza Split 08/30/2011   PFIZER(Purple Top)SARS-COV-2 Vaccination 02/22/2020, 03/17/2020, 02/26/2021   Tdap 03/01/2021   Health Maintenance Due  Topic Date Due   COVID-19 Vaccine (4 - Booster for [employer] series) 04/23/2021   4. Cervical cancer screening- pap smear 12/24/19 with 3 year repeat planned *** 5. Breast cancer screening-  breast exam *** and mammogram *** 6. Colon cancer screening - *** 7. Skin cancer screening- ***advised regular sunscreen use. Denies worrisome, changing, or new skin lesions.  8. Birth control/STD check- *** 9. Osteoporosis screening at 82- *** -*** smoker  Status of chronic or acute concerns   no flu shot since MS but mainly the tysabri shes been told ***  # Hx of COVID + - Tested positive the morning of 09/21/21 - some nasal congestion, cough and chills and runny nose and also body aches, sob and felt exhausted.   #Hypertension S: Compliant with triamterene  hydrochlorothiazide 37.5-25 mg. Home readings #s: *** BP Readings from Last 3 Encounters:  08/27/21 127/80  04/17/21 118/86  03/13/21 140/90  A/P: ***   #Multiple sclerosis S: Patient is doing reasonably well on Tysabri. Continued to follow with Dr. Felecia Shelling. A/P: ***   #Overactive bladder/urinary frequency- improved recently in 2020- using oxybutynin less. Thought moving BP medicine to night was helpful  #Anxiety  S: Patient was started on Lexapro 10 mg by Dr. Felecia Shelling, later transitioned to buspirone and doing ok. She stated primarily due to anxiety issues- she stated had done better on medication A/P: ***    Recommended follow up: No follow-ups on file. Future Appointments  Date Time Provider [place]  10/23/2021 10:40 AM Marin Olp, MD [facility]  02/25/2022  3:00 PM Sater, Nanine Means, MD [facility] None    No chief complaint on file.  Lab/Order associations:*** fasting No diagnosis found.  No orders of the defined types were placed in this encounter.  I,Jada Bradford,acting as a scribe for Garret Reddish, MD.,have documented all relevant documentation on the  behalf of Garret Reddish, MD,as directed by  Garret Reddish, MD while in the presence of Garret Reddish, MD.  ***  Return precautions advised.  Burnett Corrente

## 2021-10-23 ENCOUNTER — Encounter: Payer: 59 | Admitting: Family Medicine

## 2021-10-23 DIAGNOSIS — Z8616 Personal history of COVID-19: Secondary | ICD-10-CM

## 2021-10-23 DIAGNOSIS — G35 Multiple sclerosis: Secondary | ICD-10-CM

## 2021-10-23 DIAGNOSIS — Z0000 Encounter for general adult medical examination without abnormal findings: Secondary | ICD-10-CM

## 2021-10-23 DIAGNOSIS — F419 Anxiety disorder, unspecified: Secondary | ICD-10-CM

## 2021-10-23 DIAGNOSIS — I10 Essential (primary) hypertension: Secondary | ICD-10-CM

## 2021-11-20 ENCOUNTER — Other Ambulatory Visit: Payer: Self-pay | Admitting: Family Medicine

## 2021-12-09 ENCOUNTER — Encounter: Payer: Self-pay | Admitting: Family Medicine

## 2021-12-09 ENCOUNTER — Other Ambulatory Visit: Payer: Self-pay

## 2021-12-09 ENCOUNTER — Ambulatory Visit: Payer: 59 | Admitting: Family Medicine

## 2021-12-09 VITALS — BP 150/90 | HR 76 | Temp 98.1°F | Ht 65.0 in | Wt 217.1 lb

## 2021-12-09 DIAGNOSIS — J0100 Acute maxillary sinusitis, unspecified: Secondary | ICD-10-CM | POA: Diagnosis not present

## 2021-12-09 MED ORDER — AZITHROMYCIN 250 MG PO TABS: ORAL_TABLET | ORAL | 0 refills | Status: AC

## 2021-12-09 NOTE — Patient Instructions (Signed)
Meds have been sent the the pharmacy °You can take tylenol for pain/fevers °If worsening symptoms, let us know or go to the Emergency room  ° ° °

## 2021-12-09 NOTE — Progress Notes (Signed)
Subjective:     Patient ID: [name], female    DOB: [birth year], 36 y.o.   MRN: [id number]  Chief Complaint  Patient presents with   Cough    Dry cough Started 1 week ago   Nasal Congestion   Headache    HPI Chief complaint: cough/congestion Symptom onset: 1 wk Pertinent positives: dry cough, ha, nasal congestion.  Pertinent negatives: no f/c.  Covid neg sev x Treatments tried: zyrtec, mucinex, robitussin Vaccine status: yes covid and booster.  Had covid in Nov Sick exposure: none   Health Maintenance Due  Topic Date Due   COVID-19 Vaccine (4 - Booster for [employer] series) 04/23/2021    Past Medical History:  Diagnosis Date   Allergic rhinitis due to pollen 02/06/2010   Esophageal reflux 11/29/2008   HEADACHE, CLUSTER, CHRONIC 02/06/2010   patient reports migraines, not cluster headaches from problem list   Hypertension    Multiple sclerosis ([place])    Neuromuscular disorder ([place])    multiple sclerosis    Past Surgical History:  Procedure Laterality Date   CESAREAN SECTION  06/29/2012   Procedure: CESAREAN SECTION;  Surgeon: Allena Katz, MD;  Location: [facility];  Service: Obstetrics;  Laterality: N/A;   IUD REMOVAL  2018   MYOMECTOMY N/A 02/21/2018   Procedure: MYOMECTOMY, ABDOMINAL;  Surgeon: Molli Posey, MD;  Location: [facility];  Service: Gynecology;  Laterality: N/A;    Outpatient Medications Prior to Visit  Medication Sig Dispense Refill   ADVAIR DISKUS 250-50 MCG/ACT AEPB 1 puff 2 (two) times daily.     amLODipine (NORVASC) 2.5 MG tablet Take 1 tablet (2.5 mg total) by mouth daily. 90 tablet 3   baclofen (LIORESAL) 10 MG tablet Take 1 tablet (10 mg total) by mouth 3 (three) times daily. 270 tablet 0   busPIRone (BUSPAR) 5 MG tablet TAKE 1 TABLET (5 MG TOTAL) BY MOUTH 3 (THREE) TIMES DAILY AS NEEDED. 60 tablet 2   cetirizine (ZYRTEC) 10 MG tablet TAKE 1 TABLET BY MOUTH EVERY DAY AS NEEDED FOR ALLERGY 30 tablet 5   fluticasone (FLONASE) 50  MCG/ACT nasal spray TWO SPRAYS EACH NOSTRIL ONCE A DAY 1 g 0   ibuprofen (ADVIL,MOTRIN) 800 MG tablet Take 1 tablet (800 mg total) by mouth every 8 (eight) hours as needed (mild pain). 30 tablet 0   natalizumab (TYSABRI) 300 MG/15ML injection Inject 300 mg into the vein every 30 (thirty) days.     oxybutynin (DITROPAN-XL) 10 MG 24 hr tablet TAKE 1 TABLET BY MOUTH EVERYDAY AT BEDTIME 90 tablet 2   rizatriptan (MAXALT-MLT) 10 MG disintegrating tablet TAKE 1 TABLET BY MOUTH AS NEEDED FOR MIGRAINE. MAY REPEAT IN 2 HOURS IF NEEDED 9 tablet 3   triamterene-hydrochlorothiazide (MAXZIDE-25) 37.5-25 MG tablet Take 1 tablet by mouth daily. 90 tablet 3   Facility-Administered Medications Prior to Visit  Medication Dose Route Frequency Provider Last Rate Last Admin   gadoteridol (PROHANCE) injection 13 mL  13 mL Intravenous Once PRN Melvenia Beam, MD        No Known Allergies HBZ:JIRCVELF/YBOFBPZWCHENIDP except as noted in HPI      Objective:     BP (!) 150/90    Pulse 76    Temp 98.1 F (36.7 C) (Temporal)    Ht 5\' 5"  (1.651 m)    Wt 217 lb 2 oz (98.5 kg)    SpO2 99%    BMI 36.13 kg/m  Wt Readings from Last 3 Encounters:  12/09/21  217 lb 2 oz (98.5 kg)  09/21/21 208 lb (94.3 kg)  08/27/21 209 lb 8 oz (95 kg)        Gen: WDWN NAD OAAF HEENT: NCAT, conjunctiva not injected, sclera nonicteric TM WNL B, OP moist, no exudates  sinuses tender NECK:  supple, no thyromegaly, no nodes, no carotid bruits CARDIAC: RRR, S1S2+, no murmur. DP 2+B LUNGS: CTAB. No wheezes EXT:  no edema MSK: no gross abnormalities.  NEURO: A&O x3.  CN II-XII intact.  PSYCH: normal mood. Good eye contact  Assessment & Plan:   Problem List Items Addressed This Visit   None Visit Diagnoses     Acute non-recurrent maxillary sinusitis    -  Primary   Relevant Medications   azithromycin (ZITHROMAX) 250 MG tablet      Sinusitis-prob viral.  On immunosuppressive meds.  Will tx w/zpk.  Symptomatic tx.  Worse, new  symptoms, let us know  Meds ordered this encounter  Medications   azithromycin (ZITHROMAX) 250 MG tablet    Sig: Take 2 tablets on day 1, then 1 tablet daily on days 2 through 5    Dispense:  6 tablet    Refill:  0    Wellington Hampshire, MD

## 2022-01-13 NOTE — Progress Notes (Signed)
Phone [phone]   Subjective:  Patient presents today for their annual physical. Chief complaint-noted.   See problem oriented charting- ROS- full  review of systems was completed and negative per full ROS sheet  The following were reviewed and entered/updated in epic: Past Medical History:  Diagnosis Date   Allergic rhinitis due to pollen 02/06/2010   Esophageal reflux 11/29/2008   HEADACHE, CLUSTER, CHRONIC 02/06/2010   patient reports migraines, not cluster headaches from problem list   Hypertension    Multiple sclerosis ([place])    Neuromuscular disorder ([place])    multiple sclerosis   Patient Active Problem List   Diagnosis Date Noted   Relapsing remitting multiple sclerosis ([place]) 04/09/2016    Priority: High   Anxiety 08/25/2018    Priority: Medium    Essential hypertension 04/09/2016    Priority: Medium    Migraine 03/29/2016    Priority: Medium    High risk medication use 04/09/2016    Priority: Low   Allergic rhinitis due to pollen 02/06/2010    Priority: Low   Snoring 09/15/2020   Daytime sleepiness 09/15/2020   OSA (obstructive sleep apnea) 09/15/2020   Immunosuppressed status ([place]) 07/24/2020   Leiomyoma 02/21/2018   Abnormal brain MRI 04/16/2016   Numbness 04/09/2016   Urinary frequency 04/09/2016   Other fatigue 04/09/2016   Past Surgical History:  Procedure Laterality Date   CESAREAN SECTION  06/29/2012   Procedure: CESAREAN SECTION;  Surgeon: Allena Katz, MD;  Location: [facility];  Service: Obstetrics;  Laterality: N/A;   IUD REMOVAL  2018   MYOMECTOMY N/A 02/21/2018   Procedure: MYOMECTOMY, ABDOMINAL;  Surgeon: Molli Posey, MD;  Location: [facility];  Service: Gynecology;  Laterality: N/A;    Family History  Problem Relation Age of Onset   Lung cancer Maternal Grandfather    Hypertension Mother    Hypertension Father    Mental illness Maternal Grandmother    Diabetes Maternal Grandmother    Glaucoma Paternal Grandmother    Hypertension  Paternal Grandfather     Medications- reviewed and updated Current Outpatient Medications  Medication Sig Dispense Refill   amLODipine (NORVASC) 2.5 MG tablet Take 1 tablet (2.5 mg total) by mouth daily. 90 tablet 3   baclofen (LIORESAL) 10 MG tablet Take 1 tablet (10 mg total) by mouth 3 (three) times daily. 270 tablet 0   busPIRone (BUSPAR) 5 MG tablet TAKE 1 TABLET (5 MG TOTAL) BY MOUTH 3 (THREE) TIMES DAILY AS NEEDED. 60 tablet 2   cetirizine (ZYRTEC) 10 MG tablet TAKE 1 TABLET BY MOUTH EVERY DAY AS NEEDED FOR ALLERGY 30 tablet 5   fluticasone (FLONASE) 50 MCG/ACT nasal spray TWO SPRAYS EACH NOSTRIL ONCE A DAY 1 g 0   ibuprofen (ADVIL,MOTRIN) 800 MG tablet Take 1 tablet (800 mg total) by mouth every 8 (eight) hours as needed (mild pain). 30 tablet 0   natalizumab (TYSABRI) 300 MG/15ML injection Inject 300 mg into the vein every 30 (thirty) days.     oxybutynin (DITROPAN-XL) 10 MG 24 hr tablet TAKE 1 TABLET BY MOUTH EVERYDAY AT BEDTIME 90 tablet 2   rizatriptan (MAXALT-MLT) 10 MG disintegrating tablet TAKE 1 TABLET BY MOUTH AS NEEDED FOR MIGRAINE. MAY REPEAT IN 2 HOURS IF NEEDED 9 tablet 3   triamterene-hydrochlorothiazide (MAXZIDE-25) 37.5-25 MG tablet Take 1 tablet by mouth daily. 90 tablet 3   No current facility-administered medications for this visit.   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    Prior  Advertising account executive (Pharmacist, community) Garment/textile technologist.        Wears glasses.   Caffeine none.    Objective  Objective:  BP 118/72    Pulse 95    Temp 98.6 F (37 C)    Ht 5\' 5"  (1.651 m)    Wt 216 lb 3.2 oz (98.1 kg)    SpO2 95%    BMI 35.98 kg/m  Gen: NAD, resting comfortably HEENT: Mucous membranes are moist. Oropharynx normal Neck: no thyromegaly CV: RRR no murmurs rubs or gallops Lungs: CTAB no crackles, wheeze, rhonchi Abdomen: soft/nontender/nondistended/normal bowel sounds. No rebound or guarding.  Ext: no edema Skin: warm, dry Neuro: grossly normal, moves all extremities, PERRLA   Assessment and Plan   36 y.o. female presenting for annual physical.  Health Maintenance counseling: 1. Anticipatory guidance: Patient counseled regarding regular dental exams -q6 months, eye exams - yearly,  avoiding smoking and second hand smoke , limiting alcohol to 1 beverage per day . No illicit drugs    2. Risk factor reduction:  Advised patient of need for regular exercise and diet rich and fruits and vegetables to reduce risk of heart attack and stroke.  Exercise- still working with trainer- just started back after cough resolved- 4x a week.  Diet/Diet management-weight trended up some with recent illness- had gotten as low as 170 but trended up- working on a meal plan with trainer.  Wt Readings from Last 3 Encounters:  01/19/22 216 lb 3.2 oz (98.1 kg)  12/09/21 217 lb 2 oz (98.5 kg)  09/21/21 208 lb (94.3 kg)  3. Immunizations/screenings/ancillary studies DISCUSSED:  - no flu shot as has MS -COVID booster vaccination #4 - recommended at pharmacy  Immunization History  Administered Date(s) Administered   Influenza Split 08/30/2011   PFIZER(Purple Top)SARS-COV-2 Vaccination 02/22/2020, 03/17/2020, 02/26/2021   Tdap 03/01/2021  4. Cervical cancer screening-pap smear 12/24/2019 with 3 year repeat planned - had one last month as well- will request records- normal 5. Breast cancer screening-  breast exam with GYN and mammogram - plans to start at 41 6. Colon cancer screening - no  family history, start at age 59 7. Skin cancer screening- lower risk due to melanin content. advised regular sunscreen use. Denies worrisome, changing, or new skin lesions.  8. Birth control/STD check- nexplanon in place in arm- hasnt decided on next steps when removes this.  9. Osteoporosis screening at 48- will plan on this. Monogomous with son's father- no std screening needed- plus extra safe and had screened with Dr. Matthew Saras.  -never  smoker  Status of chronic or acute concerns   #Hypertension S: Compliant with triamterene hydrochlorothiazide 37.5-25 mg daily and amlodipine 2.5 mg Home readings #s: similar readings at home. Slight spikes with infusion BP Readings from Last 3 Encounters:  01/19/22 118/72  12/09/21 (!) 150/90  08/27/21 127/80  A/P: Controlled. Continue current medications.   # Multiple sclerosis S: Patient is doing reasonably well on Tysabri. Continued  to follow with Dr. Felecia Shelling. Last visit in October -baclofen still helps back pain A/P: Controlled. Continue current medications and neurology follow up    #Overactive bladder/urinary frequency- improved recently in 2020- using daily with reasonable control  #Migraines- may end up with MRI- has had some increase. Using rizatriptan right at twice a week- neurology is considering adding more medication for prevention  #Anxiety  S: Patient was started on Lexapro 10 mg by Dr. Felecia Shelling, later transitioned to buspirone 5 mf as needed and was doing okay. She stated primarily due to anxiety issues- she stated had done better on medication A/P: doing well- continue current meds   #hyperlipidemia S: Medication:none  Lab Results  Component Value Date   CHOL 157 12/11/2018   HDL 54.80 12/11/2018   LDLCALC 88 12/11/2018   TRIG 71.0 12/11/2018   CHOLHDL 3 12/11/2018   A/P: we discussed newer guidelines for LDL under 70 ideally- would not start meds but can work on lifestyle some  Recommended follow up:No follow-ups on  file. Future Appointments  Date Time Provider [place]  02/25/2022  3:00 PM Sater, Nanine Means, MD [facility] None   Lab/Order associations:NOT fasting   ICD-10-CM   1. Essential hypertension  I10     2. Preventative health care  Z00.00     3. Relapsing remitting multiple sclerosis ([place])  G35     4. Anxiety  F41.9      No orders of the defined types were placed in this encounter.  I,Jada Bradford,acting as a scribe for Garret Reddish, MD.,have documented all relevant documentation on the behalf of Garret Reddish, MD,as directed by  Garret Reddish, MD while in the presence of Garret Reddish, MD.  I, Garret Reddish, MD, have reviewed all documentation for this visit. The documentation on 01/19/22 for the exam, diagnosis, procedures, and orders are all accurate and complete.  Return precautions advised.  Garret Reddish, MD

## 2022-01-19 ENCOUNTER — Encounter: Payer: Self-pay | Admitting: Family Medicine

## 2022-01-19 ENCOUNTER — Other Ambulatory Visit: Payer: Self-pay

## 2022-01-19 ENCOUNTER — Ambulatory Visit (INDEPENDENT_AMBULATORY_CARE_PROVIDER_SITE_OTHER): Payer: 59 | Admitting: Family Medicine

## 2022-01-19 VITALS — BP 118/72 | HR 95 | Temp 98.6°F | Ht 65.0 in | Wt 216.2 lb

## 2022-01-19 DIAGNOSIS — G35 Multiple sclerosis: Secondary | ICD-10-CM

## 2022-01-19 DIAGNOSIS — I10 Essential (primary) hypertension: Secondary | ICD-10-CM | POA: Diagnosis not present

## 2022-01-19 DIAGNOSIS — Z0000 Encounter for general adult medical examination without abnormal findings: Secondary | ICD-10-CM | POA: Diagnosis not present

## 2022-01-19 DIAGNOSIS — F419 Anxiety disorder, unspecified: Secondary | ICD-10-CM

## 2022-01-19 DIAGNOSIS — D849 Immunodeficiency, unspecified: Secondary | ICD-10-CM

## 2022-01-19 LAB — COMPREHENSIVE METABOLIC PANEL WITH GFR
ALT: 15 U/L (ref 0–35)
AST: 19 U/L (ref 0–37)
Albumin: 4.4 g/dL (ref 3.5–5.2)
Alkaline Phosphatase: 60 U/L (ref 39–117)
BUN: 15 mg/dL (ref 6–23)
CO2: 24 meq/L (ref 19–32)
Calcium: 9.8 mg/dL (ref 8.4–10.5)
Chloride: 100 meq/L (ref 96–112)
Creatinine, Ser: 0.89 mg/dL (ref 0.40–1.20)
GFR: 84.17 mL/min
Glucose, Bld: 109 mg/dL — ABNORMAL HIGH (ref 70–99)
Potassium: 3.5 meq/L (ref 3.5–5.1)
Sodium: 134 meq/L — ABNORMAL LOW (ref 135–145)
Total Bilirubin: 0.4 mg/dL (ref 0.2–1.2)
Total Protein: 7.9 g/dL (ref 6.0–8.3)

## 2022-01-19 LAB — CBC WITH DIFFERENTIAL/PLATELET
Basophils Absolute: 0.1 10*3/uL (ref 0.0–0.1)
Basophils Relative: 0.4 % (ref 0.0–3.0)
Eosinophils Absolute: 0.3 10*3/uL (ref 0.0–0.7)
Eosinophils Relative: 2.2 % (ref 0.0–5.0)
HCT: 34.3 % — ABNORMAL LOW (ref 36.0–46.0)
Hemoglobin: 11.9 g/dL — ABNORMAL LOW (ref 12.0–15.0)
Lymphocytes Relative: 48.8 % — ABNORMAL HIGH (ref 12.0–46.0)
Lymphs Abs: 6.8 10*3/uL — ABNORMAL HIGH (ref 0.7–4.0)
MCHC: 34.6 g/dL (ref 30.0–36.0)
MCV: 83.7 fl (ref 78.0–100.0)
Monocytes Absolute: 1.3 10*3/uL — ABNORMAL HIGH (ref 0.1–1.0)
Monocytes Relative: 9.1 % (ref 3.0–12.0)
Neutro Abs: 5.5 10*3/uL (ref 1.4–7.7)
Neutrophils Relative %: 39.5 % — ABNORMAL LOW (ref 43.0–77.0)
Platelets: 284 10*3/uL (ref 150.0–400.0)
RBC: 4.09 Mil/uL (ref 3.87–5.11)
RDW: 14.9 % (ref 11.5–15.5)
WBC: 14 10*3/uL — ABNORMAL HIGH (ref 4.0–10.5)

## 2022-01-19 LAB — LIPID PANEL
Cholesterol: 133 mg/dL (ref 0–200)
HDL: 49.7 mg/dL (ref >39.00)
LDL Cholesterol: 52 mg/dL (ref 0–99)
NonHDL: 83.44
Total CHOL/HDL Ratio: 3
Triglycerides: 156 mg/dL — ABNORMAL HIGH (ref 0.0–149.0)
VLDL: 31.2 mg/dL (ref 0.0–40.0)

## 2022-01-19 NOTE — Patient Instructions (Addendum)
Please stop by lab before you go ?If you have mychart- we will send your results within 3 business days of [place] receiving them.  ?If you do not have mychart- we will call you about results within 5 business days of [place] receiving them.  ?*please also note that you will see labs on mychart as soon as they post. I will later go in and write notes on them- will say "notes from Dr. Yong Channel"  ? ?Sign release of information at the check out desk for last pap smear ? ?Recommended follow up: Return in about 6 months (around 07/22/2022) for follow up- or sooner if needed. ?

## 2022-02-01 ENCOUNTER — Other Ambulatory Visit: Payer: Self-pay | Admitting: Family Medicine

## 2022-02-05 ENCOUNTER — Other Ambulatory Visit: Payer: Self-pay | Admitting: Neurology

## 2022-02-24 ENCOUNTER — Other Ambulatory Visit: Payer: Self-pay

## 2022-02-24 MED ORDER — RIZATRIPTAN BENZOATE 10 MG PO TBDP
ORAL_TABLET | ORAL | 3 refills | Status: AC
Start: 2022-02-24 — End: ?

## 2022-02-25 ENCOUNTER — Encounter: Payer: Self-pay | Admitting: Neurology

## 2022-02-25 ENCOUNTER — Ambulatory Visit (INDEPENDENT_AMBULATORY_CARE_PROVIDER_SITE_OTHER): Payer: 59 | Admitting: Neurology

## 2022-02-25 VITALS — BP 139/92 | HR 83 | Ht 65.0 in | Wt 216.5 lb

## 2022-02-25 DIAGNOSIS — R350 Frequency of micturition: Secondary | ICD-10-CM

## 2022-02-25 DIAGNOSIS — G43109 Migraine with aura, not intractable, without status migrainosus: Secondary | ICD-10-CM | POA: Diagnosis not present

## 2022-02-25 DIAGNOSIS — R5383 Other fatigue: Secondary | ICD-10-CM

## 2022-02-25 DIAGNOSIS — G35 Multiple sclerosis: Secondary | ICD-10-CM | POA: Diagnosis not present

## 2022-02-25 DIAGNOSIS — Z79899 Other long term (current) drug therapy: Secondary | ICD-10-CM

## 2022-02-25 MED ORDER — BACLOFEN 10 MG PO TABS: 10.0000 mg | ORAL_TABLET | Freq: Three times a day (TID) | ORAL | 1 refills | Status: DC

## 2022-02-25 MED ORDER — TOPIRAMATE 50 MG PO TABS: 50.0000 mg | ORAL_TABLET | Freq: Two times a day (BID) | ORAL | 11 refills | Status: DC

## 2022-02-25 MED ORDER — ALPRAZOLAM 0.5 MG PO TABS: ORAL_TABLET | ORAL | 0 refills | Status: DC

## 2022-02-25 NOTE — Progress Notes (Signed)
? ?[facility] ? ?PATIENT: [name] ?DOB: 09/02/1986 ? ?REFERRING DOCTOR OR PCP:  Garret Reddish ?SOURCE: Patient, notes from [facility], lab and other results from [facility], MRI images personally reviewed on PACS ? ?_________________________________ ? ? ?HISTORICAL ? ?CHIEF COMPLAINT:  ?Chief Complaint  ?Patient presents with  ? Follow-up  ?  Rm 1, alone. Here for 35 month MS f/u, on Tysabri and tolerating well. MS stable. No new sx.   ? ? ?HISTORY OF PRESENT ILLNESS:  ?[name] is a 36 y.o. woman with relapsing remitting MS.      ? ?Update 02/25/2022: ?She feels her MS is stable and she denies any exacerbations or new symptoms.    She is on Tysabri and tolerates it well.  Her last MRI (brain and cervical spine) was 02/20/2020.  There were no new lesions.  JCV Ab was negative 09/23/2021 (0.21)     ? ?Gait is doing well.   Se has no falls.   She can walk 2 miles straight.    She can go up and down stairs well.   She can walk a couple miles.    She does daily exercises.    Strength is ok thought the right side is slightly weaker than her left (noted with working out).    She gets muscle cramps in her calves that she can break by walking on them but pain can last over a day..  She denies tingling or numbness.    Her urinary urgency is helped by oxybutynin.    She has no new vision changes.   ? ?She has some fatigue but this fluctuates and not related to amount of sleep.  She is sleeping well most night.   She does not take naps.   Mood is doing ok.   She is back n buspirone for anxiety.   It has helped.    ? ?Migraines occur 4/7 days a week.   They are helped by Maxalt  ? ?She has done her vaccinations and will get the booster later this week.       ? ?MS History:    ?In May, 2017,  she had the onset of right arm numbness and weakness that developed over one day.    She went to the emergency room and an MRI of the brain on 03/28/2016 showed multiple T2/FLAIR hyperintense foci consistent  with multiple sclerosis. She was admitted and received 5 days of IV steroids. While in the hospital she had a contrasted MRI of the brain that showed that 2 of those lesions enhanced consistent with more acute onset. Additionally, she had 2 lesions in the cervical spine, both enhancing. The one at C6 was towards the right and could explain her symptoms. Another one was adjacent to C3-C4, posteriorly. She also had a lumbar puncture. The cerebrospinal fluid shows that there were 9 oligoclonal bands in the CSF which is also consistent with multiple sclerosis. Due to the aggressiveness of her MS, she was started on Tysabri. She is JCV antibody negative (0.25).   In retrospect, in 2011, she had headaches and tremors and had an MRI of the brain. It showed a single T2/FLAIR hyperintense focus in the right periventricular white matter of the parietal lobe.   Follow-up MRI in 2012 did not show any new lesions. The symptoms improved and she had no other symptoms until 2017 ? ?Imaging: ?MRI of the cervical spine 03/11/2020 shows T2 hyperintense foci within the spinal cord posteriorly adjacent to C3-C4  and posterolaterally to the right adjacent to C6.  Minimal disc degenerative changes at C3-C4. ? ?MRI of the brain 03/11/2020 shows Multiple T2/FLAIR hyperintense foci in the hemispheres in a pattern and configuration consistent with chronic demyelinating plaque associated with multiple sclerosis.  None of the foci appear to be acute and they were all present on the 08/29/2018 MRI. ? ?MRI of the brain 08/29/2018 shows T2/FLAIR hyperintense foci in the hemispheres and left pons in a pattern and configuration consistent with chronic demyelinating plaque associated with multiple sclerosis.  None of the foci appear to be acute.  When compared to the MRI from May 2017, there are no new lesions.  Some of the foci that were acute with enhancement in 2017 are smaller on the current study and no longer enhance. ? ?MRI of the thoracic spine  03/29/2016 was normal ? ? ? ?REVIEW OF SYSTEMS: ?Review of Systems  ?Constitutional:  Positive for malaise/fatigue.  ?HENT:  Negative for congestion and hearing loss.   ?Eyes: Negative.  Negative for blurred vision, double vision, photophobia and pain.  ?Respiratory:  Negative for cough.   ?Cardiovascular:  Negative for chest pain.  ?Gastrointestinal:  Negative for abdominal pain, nausea and vomiting.  ?Genitourinary:  Positive for frequency and urgency. Negative for dysuria.  ?Musculoskeletal:  Negative for back pain and neck pain.  ?Skin:  Negative for itching and rash.  ?Neurological: Negative.   ?Endo/Heme/Allergies: Negative.   ?Psychiatric/Behavioral:  Negative for depression. The patient has insomnia.   ? ?ALLERGIES: ?No Known Allergies ? ?HOME MEDICATIONS: ? ?Current Outpatient Medications:  ?  amLODipine (NORVASC) 2.5 MG tablet, Take 1 tablet (2.5 mg total) by mouth daily., Disp: 90 tablet, Rfl: 3 ?  busPIRone (BUSPAR) 5 MG tablet, TAKE 1 TABLET BY MOUTH 3 TIMES DAILY AS NEEDED., Disp: 60 tablet, Rfl: 2 ?  cetirizine (ZYRTEC) 10 MG tablet, TAKE 1 TABLET BY MOUTH EVERY DAY AS NEEDED FOR ALLERGY, Disp: 30 tablet, Rfl: 5 ?  fluticasone (FLONASE) 50 MCG/ACT nasal spray, TWO SPRAYS EACH NOSTRIL ONCE A DAY, Disp: 1 g, Rfl: 0 ?  ibuprofen (ADVIL,MOTRIN) 800 MG tablet, Take 1 tablet (800 mg total) by mouth every 8 (eight) hours as needed (mild pain)., Disp: 30 tablet, Rfl: 0 ?  natalizumab (TYSABRI) 300 MG/15ML injection, Inject 300 mg into the vein every 30 (thirty) days., Disp: , Rfl:  ?  oxybutynin (DITROPAN-XL) 10 MG 24 hr tablet, TAKE 1 TABLET BY MOUTH  DAILY AT BEDTIME, Disp: 90 tablet, Rfl: 1 ?  rizatriptan (MAXALT-MLT) 10 MG disintegrating tablet, TAKE 1 TABLET BY MOUTH AS NEEDED FOR MIGRAINE. MAY REPEAT IN 2 HOURS IF NEEDED, Disp: 9 tablet, Rfl: 3 ?  topiramate (TOPAMAX) 50 MG tablet, Take 1 tablet (50 mg total) by mouth 2 (two) times daily., Disp: 30 tablet, Rfl: 11 ?  triamterene-hydrochlorothiazide  (MAXZIDE-25) 37.5-25 MG tablet, Take 1 tablet by mouth daily., Disp: 90 tablet, Rfl: 3 ?  ALPRAZolam (XANAX) 0.5 MG tablet, Take 2 tablets 30 min. prior to MRI.  May take one more if needed immediately before MRI.  You must not drink alcohol, drive, or operate dangerous equipment after taking this medication., Disp: 3 tablet, Rfl: 0 ?  baclofen (LIORESAL) 10 MG tablet, Take 1 tablet (10 mg total) by mouth 3 (three) times daily., Disp: 270 tablet, Rfl: 1 ?No current facility-administered medications for this visit. ? ?Facility-Administered Medications Ordered in Other Visits:  ?  gadoteridol (PROHANCE) injection 13 mL, 13 mL, Intravenous, Once PRN, Melvenia Beam, MD ? ?  PAST MEDICAL HISTORY: ?Past Medical History:  ?Diagnosis Date  ? Allergic rhinitis due to pollen 02/06/2010  ? Esophageal reflux 11/29/2008  ? HEADACHE, CLUSTER, CHRONIC 02/06/2010  ? patient reports migraines, not cluster headaches from problem list  ? Hypertension   ? Multiple sclerosis ([place])   ? Neuromuscular disorder ([place])   ? multiple sclerosis  ? ? ?PAST SURGICAL HISTORY: ?Past Surgical History:  ?Procedure Laterality Date  ? CESAREAN SECTION  06/29/2012  ? Procedure: CESAREAN SECTION;  Surgeon: Allena Katz, MD;  Location: [facility];  Service: Obstetrics;  Laterality: N/A;  ? IUD REMOVAL  2018  ? MYOMECTOMY N/A 02/21/2018  ? Procedure: MYOMECTOMY, ABDOMINAL;  Surgeon: Molli Posey, MD;  Location: [facility];  Service: Gynecology;  Laterality: N/A;  ? ? ?FAMILY HISTORY: ?Family History  ?Problem Relation Age of Onset  ? Lung cancer Maternal Grandfather   ? Hypertension Mother   ? Hypertension Father   ? Mental illness Maternal Grandmother   ? Diabetes Maternal Grandmother   ? Glaucoma Paternal Grandmother   ? Hypertension Paternal Grandfather   ? ? ?SOCIAL HISTORY: ? ?Social History  ? ?Socioeconomic History  ? Marital status: Single  ?  Spouse name: Not on file  ? Number of children: 1  ? Years of education: Not on file  ? Highest education  level: Not on file  ?Occupational History  ? Not on file  ?Tobacco Use  ? Smoking status: Never  ? Smokeless tobacco: Never  ?Vaping Use  ? Vaping Use: Never used  ?Substance and Sexual Activity  ? Alcohol use: Darreld Mclean

## 2022-03-02 ENCOUNTER — Telehealth: Payer: Self-pay | Admitting: Neurology

## 2022-03-02 NOTE — Telephone Encounter (Signed)
LVM for pt to call back to schedule  ? ?[employer] auth: [employer] via [employer] website  ?

## 2022-03-03 NOTE — Telephone Encounter (Signed)
X2 lvm for pt to call back.  ?

## 2022-03-03 NOTE — Telephone Encounter (Signed)
Patient returned my call she is scheduled at [facility] for 03/16/22.  ?

## 2022-03-09 ENCOUNTER — Telehealth: Payer: Self-pay | Admitting: *Deleted

## 2022-03-09 ENCOUNTER — Other Ambulatory Visit (INDEPENDENT_AMBULATORY_CARE_PROVIDER_SITE_OTHER): Payer: Self-pay

## 2022-03-09 DIAGNOSIS — Z0289 Encounter for other administrative examinations: Secondary | ICD-10-CM

## 2022-03-09 DIAGNOSIS — G35 Multiple sclerosis: Secondary | ICD-10-CM

## 2022-03-09 DIAGNOSIS — Z79899 Other long term (current) drug therapy: Secondary | ICD-10-CM

## 2022-03-09 NOTE — Telephone Encounter (Signed)
Placed JCV lab in [facility] lock box for routine lab pick up. Results pending. 

## 2022-03-10 ENCOUNTER — Telehealth: Payer: Self-pay | Admitting: Neurology

## 2022-03-10 DIAGNOSIS — G35 Multiple sclerosis: Secondary | ICD-10-CM

## 2022-03-10 LAB — CBC WITH DIFFERENTIAL/PLATELET
Basophils Absolute: 0.1 10*3/uL (ref 0.0–0.2)
Basos: 1 %
EOS (ABSOLUTE): 0.2 10*3/uL (ref 0.0–0.4)
Eos: 2 %
Hematocrit: 38.3 % (ref 34.0–46.6)
Hemoglobin: 13.1 g/dL (ref 11.1–15.9)
Immature Grans (Abs): 0 10*3/uL (ref 0.0–0.1)
Immature Granulocytes: 0 %
Lymphocytes Absolute: 5.8 10*3/uL — ABNORMAL HIGH (ref 0.7–3.1)
Lymphs: 55 %
MCH: 28.4 pg (ref 26.6–33.0)
MCHC: 34.2 g/dL (ref 31.5–35.7)
MCV: 83 fL (ref 79–97)
Monocytes Absolute: 1 10*3/uL — ABNORMAL HIGH (ref 0.1–0.9)
Monocytes: 10 %
NRBC: 1 % — ABNORMAL HIGH (ref 0–0)
Neutrophils Absolute: 3.3 10*3/uL (ref 1.4–7.0)
Neutrophils: 32 %
Platelets: 332 10*3/uL (ref 150–450)
RBC: 4.62 x10E6/uL (ref 3.77–5.28)
RDW: 14.2 % (ref 11.7–15.4)
WBC: 10.4 10*3/uL (ref 3.4–10.8)

## 2022-03-10 NOTE — Telephone Encounter (Signed)
Pt is having numbness in right hand, states she can still move her hand but wanted to inform the office.  ?Pt has a MRI scheduled 03/16/2022.  ?Would like a call back. ?[phone]  ?

## 2022-03-10 NOTE — Telephone Encounter (Signed)
Called the patient and advised that per Dr Felecia Shelling he would like to add a MRI of the cervical spine to make sure there are no new changes. Pt made aware that if these symptoms worsen to let us know. She was appreciative for the call back.  ? ?Per Dr Felecia Shelling- "go ahead and also add an MRI of the cervical spine to make sure that either an MS plaque in the spinal cord or a pinched nerve is not causing her symptoms.  If symptoms stay in the hand I would not do anything different but if they increase I would like her to come in for a visit" ?

## 2022-03-10 NOTE — Telephone Encounter (Signed)
Called the patient back. She states that she was working and she had numbness in right hand to come on suddenly. She states it is only in the hand but she wanted to make [place] aware. Advised since this is localized to that area we would at this point continue to monitor. Advised I would make Dr. Felecia Shelling aware of this information in case there is something else he would recommend but otherwise she should call us if things worsen and we will wait and see what the MRI shows.  ?

## 2022-03-16 ENCOUNTER — Other Ambulatory Visit: Payer: 59

## 2022-03-16 ENCOUNTER — Ambulatory Visit (INDEPENDENT_AMBULATORY_CARE_PROVIDER_SITE_OTHER): Payer: 59

## 2022-03-16 DIAGNOSIS — G35 Multiple sclerosis: Secondary | ICD-10-CM

## 2022-03-16 MED ORDER — GADOBENATE DIMEGLUMINE 529 MG/ML IV SOLN
20.0000 mL | Freq: Once | INTRAVENOUS | Status: AC | PRN
  Administered 2022-03-16: 20 mL via INTRAVENOUS

## 2022-03-22 NOTE — Telephone Encounter (Signed)
JCV ab indeterminate, index: 0.26. Inhibition assay: negative.  ?

## 2022-03-29 ENCOUNTER — Other Ambulatory Visit: Payer: Self-pay | Admitting: Family Medicine

## 2022-04-06 ENCOUNTER — Other Ambulatory Visit: Payer: Self-pay | Admitting: Family Medicine

## 2022-05-28 ENCOUNTER — Other Ambulatory Visit: Payer: Self-pay

## 2022-05-28 MED ORDER — AMLODIPINE BESYLATE 2.5 MG PO TABS: 2.5000 mg | ORAL_TABLET | Freq: Every day | ORAL | 3 refills | Status: DC

## 2022-07-27 ENCOUNTER — Encounter: Payer: Self-pay | Admitting: Family Medicine

## 2022-07-27 ENCOUNTER — Ambulatory Visit (INDEPENDENT_AMBULATORY_CARE_PROVIDER_SITE_OTHER): Payer: 59 | Admitting: Family Medicine

## 2022-07-27 VITALS — BP 120/78 | HR 81 | Temp 98.7°F | Ht 65.0 in | Wt 222.4 lb

## 2022-07-27 DIAGNOSIS — N3281 Overactive bladder: Secondary | ICD-10-CM

## 2022-07-27 DIAGNOSIS — G4733 Obstructive sleep apnea (adult) (pediatric): Secondary | ICD-10-CM

## 2022-07-27 DIAGNOSIS — G43109 Migraine with aura, not intractable, without status migrainosus: Secondary | ICD-10-CM | POA: Diagnosis not present

## 2022-07-27 DIAGNOSIS — I10 Essential (primary) hypertension: Secondary | ICD-10-CM

## 2022-07-27 NOTE — Progress Notes (Signed)
Phone [phone] In person visit   Subjective:   [name] is a 36 y.o. year old very pleasant female patient who presents for/with See problem oriented charting Chief Complaint  Patient presents with   Follow-up   Hypertension    Past Medical History-  Patient Active Problem List   Diagnosis Date Noted   Immunosuppressed status ([place]) 07/24/2020    Priority: High   Relapsing remitting multiple sclerosis ([place]) 04/09/2016    Priority: High   Anxiety 08/25/2018    Priority: Medium    Essential hypertension 04/09/2016    Priority: Medium    Migraine 03/29/2016    Priority: Medium    Leiomyoma 02/21/2018    Priority: Low   High risk medication use 04/09/2016    Priority: Low   Allergic rhinitis due to pollen 02/06/2010    Priority: Low   Snoring 09/15/2020   Daytime sleepiness 09/15/2020   OSA (obstructive sleep apnea) 09/15/2020   Abnormal brain MRI 04/16/2016   Numbness 04/09/2016   Urinary frequency 04/09/2016   Other fatigue 04/09/2016    Medications- reviewed and updated Current Outpatient Medications  Medication Sig Dispense Refill   ALPRAZolam (XANAX) 0.5 MG tablet Take 2 tablets 30 min. prior to MRI.  May take one more if needed immediately before MRI.  You must not drink alcohol, drive, or operate dangerous equipment after taking this medication. 3 tablet 0   amLODipine (NORVASC) 2.5 MG tablet Take 1 tablet (2.5 mg total) by mouth daily. 90 tablet 3   baclofen (LIORESAL) 10 MG tablet Take 1 tablet (10 mg total) by mouth 3 (three) times daily. 270 tablet 1   busPIRone (BUSPAR) 5 MG tablet TAKE 1 TABLET BY MOUTH 3 TIMES DAILY AS NEEDED. 60 tablet 2   cetirizine (ZYRTEC) 10 MG tablet TAKE 1 TABLET BY MOUTH EVERY DAY AS NEEDED FOR ALLERGY 30 tablet 5   fluticasone (FLONASE) 50 MCG/ACT nasal spray TWO SPRAYS EACH NOSTRIL ONCE A DAY 1 g 0   ibuprofen (ADVIL,MOTRIN) 800 MG tablet Take 1 tablet (800 mg total) by mouth every 8 (eight) hours as needed (mild  pain). 30 tablet 0   natalizumab (TYSABRI) 300 MG/15ML injection Inject 300 mg into the vein every 30 (thirty) days.     oxybutynin (DITROPAN-XL) 10 MG 24 hr tablet TAKE 1 TABLET BY MOUTH  DAILY AT BEDTIME 90 tablet 1   rizatriptan (MAXALT-MLT) 10 MG disintegrating tablet TAKE 1 TABLET BY MOUTH AS NEEDED FOR MIGRAINE. MAY REPEAT IN 2 HOURS IF NEEDED 9 tablet 3   topiramate (TOPAMAX) 50 MG tablet Take 1 tablet (50 mg total) by mouth 2 (two) times daily. 30 tablet 11   triamterene-hydrochlorothiazide (MAXZIDE-25) 37.5-25 MG tablet TAKE 1 TABLET BY MOUTH  DAILY 90 tablet 3   No current facility-administered medications for this visit.     Objective:  BP 120/78   Pulse 81   Temp 98.7 F (37.1 C)   Ht '5\' 5"'$  (1.651 m)   Wt 222 lb 6.4 oz (100.9 kg)   SpO2 97%   BMI 37.01 kg/m  Gen: NAD, resting comfortably CV: RRR no murmurs rubs or gallops Lungs: CTAB no crackles, wheeze, rhonchi Ext: no edema Skin: warm, dry    Assessment and Plan   #HM -no flu shot since MS but mainly the tysabri shes been told  -had covid shot in may- considering update when new information released on upcoming vaccination   #Hypertension S: Compliant with triamterene hydrochlorothiazide 37.5-25 mg, amlodipine 2.5 mg -  still working with trainer 4x a week- feels a lot stronger BP Readings from Last 3 Encounters:  07/27/22 120/78  02/25/22 (!) 139/92  01/19/22 118/72  A/P: much improved with addition of amlodipine- excellent control- continue current blood pressure medicines    % Multiple sclerosis S: Patient is doing reasonably well on Tysabri.  Continues to follow with Dr. Felecia Shelling. A/P: overall stable- continue close follow up and Tysabri    %Overactive bladder/urinary frequency- using oxybutynin nightly- helpful  #Migraines- poor control. reports was started on topamax but was using as needed at first and not using rizatriptan- did not know could use together. Encouraged considstent use of topamax per Dr.  Garth Bigness instructions and can in fact use rizatriptan as needed- hopeful this will help her.   #stress/anxiety - Patient was started on Lexapro 10 mg by Dr. Felecia Shelling, later transitioned to buspirone just as needed and doing well on this  Recommended follow up: Return in about 6 months (around 01/25/2023) for physical or sooner if needed.Schedule b4 you leave. Future Appointments  Date Time Provider [place]  09/22/2022  4:00 PM Sater, Nanine Means, MD [id number] None   Lab/Order associations:   ICD-10-CM   1. Essential hypertension  I10     2. Migraine with aura and without status migrainosus, not intractable  G43.109       Return precautions advised.  Garret Reddish, MD

## 2022-07-27 NOTE — Patient Instructions (Addendum)
much improved with addition of amlodipine- excellent control- continue current blood pressure medicines   If you could send [place] date of last covid shot that would be great so we can add to notes    Recommended follow up: Return in about 6 months (around 01/25/2023) for physical or sooner if needed.Schedule b4 you leave.

## 2022-07-29 ENCOUNTER — Other Ambulatory Visit: Payer: Self-pay | Admitting: *Deleted

## 2022-07-29 MED ORDER — TOPIRAMATE 50 MG PO TABS: 50.0000 mg | ORAL_TABLET | Freq: Two times a day (BID) | ORAL | 1 refills | Status: DC

## 2022-09-06 ENCOUNTER — Other Ambulatory Visit (INDEPENDENT_AMBULATORY_CARE_PROVIDER_SITE_OTHER): Payer: Self-pay

## 2022-09-06 ENCOUNTER — Telehealth: Payer: Self-pay | Admitting: *Deleted

## 2022-09-06 ENCOUNTER — Other Ambulatory Visit: Payer: Self-pay | Admitting: *Deleted

## 2022-09-06 DIAGNOSIS — Z0289 Encounter for other administrative examinations: Secondary | ICD-10-CM

## 2022-09-06 DIAGNOSIS — Z79899 Other long term (current) drug therapy: Secondary | ICD-10-CM

## 2022-09-06 DIAGNOSIS — G35 Multiple sclerosis: Secondary | ICD-10-CM

## 2022-09-06 NOTE — Telephone Encounter (Signed)
Placed JCV lab in [facility] lock box for routine lab pick up. Results pending. 

## 2022-09-07 LAB — CBC WITH DIFFERENTIAL/PLATELET
Basophils Absolute: 0.1 10*3/uL (ref 0.0–0.2)
Basos: 1 %
EOS (ABSOLUTE): 0.3 10*3/uL (ref 0.0–0.4)
Eos: 3 %
Hematocrit: 36.7 % (ref 34.0–46.6)
Hemoglobin: 12.7 g/dL (ref 11.1–15.9)
Immature Grans (Abs): 0 10*3/uL (ref 0.0–0.1)
Immature Granulocytes: 0 %
Lymphocytes Absolute: 6.2 10*3/uL — ABNORMAL HIGH (ref 0.7–3.1)
Lymphs: 61 %
MCH: 29.1 pg (ref 26.6–33.0)
MCHC: 34.6 g/dL (ref 31.5–35.7)
MCV: 84 fL (ref 79–97)
Monocytes Absolute: 0.9 10*3/uL (ref 0.1–0.9)
Monocytes: 8 %
NRBC: 1 % — ABNORMAL HIGH (ref 0–0)
Neutrophils Absolute: 2.8 10*3/uL (ref 1.4–7.0)
Neutrophils: 27 %
Platelets: 347 10*3/uL (ref 150–450)
RBC: 4.37 x10E6/uL (ref 3.77–5.28)
RDW: 15.1 % (ref 11.7–15.4)
WBC: 10.2 10*3/uL (ref 3.4–10.8)

## 2022-09-13 ENCOUNTER — Other Ambulatory Visit: Payer: Self-pay | Admitting: Neurology

## 2022-09-20 NOTE — Telephone Encounter (Signed)
JCV ab drawn on 09/06/22 indeterminate, index: 0.27. Inhibition assay: negative.

## 2022-09-22 ENCOUNTER — Encounter: Payer: Self-pay | Admitting: Neurology

## 2022-09-22 ENCOUNTER — Ambulatory Visit (INDEPENDENT_AMBULATORY_CARE_PROVIDER_SITE_OTHER): Payer: 59 | Admitting: Neurology

## 2022-09-22 VITALS — BP 148/93 | HR 70 | Ht 65.0 in | Wt 227.5 lb

## 2022-09-22 DIAGNOSIS — G35 Multiple sclerosis: Secondary | ICD-10-CM

## 2022-09-22 DIAGNOSIS — Z79899 Other long term (current) drug therapy: Secondary | ICD-10-CM | POA: Diagnosis not present

## 2022-09-22 DIAGNOSIS — G4733 Obstructive sleep apnea (adult) (pediatric): Secondary | ICD-10-CM

## 2022-09-22 DIAGNOSIS — R350 Frequency of micturition: Secondary | ICD-10-CM

## 2022-09-22 DIAGNOSIS — G43109 Migraine with aura, not intractable, without status migrainosus: Secondary | ICD-10-CM

## 2022-09-22 NOTE — Progress Notes (Signed)
[facility]  PATIENT: [name] DOB: [birth year]  REFERRING DOCTOR OR PCP:  Garret Reddish SOURCE: Patient, notes from [facility], lab and other results from [facility], MRI images personally reviewed on PACS  _________________________________   HISTORICAL  CHIEF COMPLAINT:  Chief Complaint  Patient presents with   Follow-up    RM 10, alone. Doing well, no issues.    HISTORY OF PRESENT ILLNESS:  [name] is a 36 y.o. woman with relapsing remitting MS.       Update 09/22/2022: She feels her MS is stable and she denies any exacerbations or new symptoms.    She is on Tysabri and tolerates it well.  Her last MRI (brain and cervical spine) was 02/20/2020.  There were no new lesions.  JCV Ab was negative 09/23/2021 (0.27)      Gait is doing well.   Se has no falls.   She can walk 2 miles straight.    She can go up and down stairs well. And only uses a bannister sometimes.     She does daily exercises.    Strength is stable and the right side is slightly weaker than her left (noted with working out).  Muscle cramps are better.  She denies tingling or numbness.    Her urinary urgency is helped by oxybutynin.    She has no new vision changes.    She has some fatigue  more than last year.  She is sleeping well most night (6-8 hours) but does wake up 1-2 times nightly   She takes naps.  She snores.   She had a sleep study in 2021 showing mild OSA=12.2.  A dental appliance was recmmended but insurance did not cover..   Mood is doing ok.   She is back n buspirone for anxiety.   It has helped.     Migraines occur 1/7 days a week down form 4/7 since starting TPM.   They are helped by Maxalt     EPWORTH SLEEPINESS SCALE  On a scale of 0 - 3 what is the chance of dozing:  Sitting and Reading:   3 Watching TV:     3 Sitting inactive in a public place: 0 Passenger in car for one hour: 3 Lying down to rest in the afternoon: 3 Sitting and talking to  someone: 0 Sitting quietly after lunch:  2 In a car, stopped in traffic:  0  Total (out of 24):   14/24 (mild to moderate EDS)   MS History:    In May, 2017,  she had the onset of right arm numbness and weakness that developed over one day.    She went to the emergency room and an MRI of the brain on 03/28/2016 showed multiple T2/FLAIR hyperintense foci consistent with multiple sclerosis. She was admitted and received 5 days of IV steroids. While in the hospital she had a contrasted MRI of the brain that showed that 2 of those lesions enhanced consistent with more acute onset. Additionally, she had 2 lesions in the cervical spine, both enhancing. The one at C6 was towards the right and could explain her symptoms. Another one was adjacent to C3-C4, posteriorly. She also had a lumbar puncture. The cerebrospinal fluid shows that there were 9 oligoclonal bands in the CSF which is also consistent with multiple sclerosis. Due to the aggressiveness of her MS, she was started on Tysabri. She is JCV antibody negative (0.25).   In retrospect, in 2011, she had headaches  and tremors and had an MRI of the brain. It showed a single T2/FLAIR hyperintense focus in the right periventricular white matter of the parietal lobe.   Follow-up MRI in 2012 did not show any new lesions. The symptoms improved and she had no other symptoms until 2017  Imaging: MRI of the brain 03/16/2022 showed Multiple T2/FLAIR hyperintense foci in the hemispheres in a pattern consistent with chronic demyelinating plaque associated with multiple sclerosis.  None of the foci enhanced.  They do not appear to be acute.  Compared to the MRI from 03/11/2020, there were no new lesions.   MRI of the cervical spine 03/16/2022 Two small foci within the spinal cord adjacent to C3-C4 and C6 consistent with chronic demyelinating plaque associated with multiple sclerosis.  2.   Minimal disc degenerative changes at C3-C4 and C4-C5 not leading to spinal stenosis  or nerve root compression MRI of the cervical spine 03/11/2020 shows T2 hyperintense foci within the spinal cord posteriorly adjacent to C3-C4 and posterolaterally to the right adjacent to C6.  Minimal disc degenerative changes at C3-C4.  MRI of the brain 03/11/2020 shows Multiple T2/FLAIR hyperintense foci in the hemispheres in a pattern and configuration consistent with chronic demyelinating plaque associated with multiple sclerosis.  None of the foci appear to be acute and they were all present on the 08/29/2018 MRI.  MRI of the brain 08/29/2018 shows T2/FLAIR hyperintense foci in the hemispheres and left pons in a pattern and configuration consistent with chronic demyelinating plaque associated with multiple sclerosis.  None of the foci appear to be acute.  When compared to the MRI from May 2017, there are no new lesions.  Some of the foci that were acute with enhancement in 2017 are smaller on the current study and no longer enhance.  MRI of the thoracic spine 03/29/2016 was normal    REVIEW OF SYSTEMS: Review of Systems  Constitutional:  Positive for malaise/fatigue.  HENT:  Negative for congestion and hearing loss.   Eyes: Negative.  Negative for blurred vision, double vision, photophobia and pain.  Respiratory:  Negative for cough.   Cardiovascular:  Negative for chest pain.  Gastrointestinal:  Negative for abdominal pain, nausea and vomiting.  Genitourinary:  Positive for frequency and urgency. Negative for dysuria.  Musculoskeletal:  Negative for back pain and neck pain.  Skin:  Negative for itching and rash.  Neurological: Negative.   Endo/Heme/Allergies: Negative.   Psychiatric/Behavioral:  Negative for depression. The patient has insomnia.     ALLERGIES: No Known Allergies  HOME MEDICATIONS:  Current Outpatient Medications:    ALPRAZolam (XANAX) 0.5 MG tablet, Take 2 tablets 30 min. prior to MRI.  May take one more if needed immediately before MRI.  You must not drink  alcohol, drive, or operate dangerous equipment after taking this medication., Disp: 3 tablet, Rfl: 0   amLODipine (NORVASC) 2.5 MG tablet, Take 1 tablet (2.5 mg total) by mouth daily., Disp: 90 tablet, Rfl: 3   baclofen (LIORESAL) 10 MG tablet, Take 1 tablet (10 mg total) by mouth 3 (three) times daily., Disp: 270 tablet, Rfl: 1   busPIRone (BUSPAR) 5 MG tablet, TAKE 1 TABLET BY MOUTH 3 TIMES DAILY AS NEEDED., Disp: 60 tablet, Rfl: 2   cetirizine (ZYRTEC) 10 MG tablet, TAKE 1 TABLET BY MOUTH EVERY DAY AS NEEDED FOR ALLERGY, Disp: 30 tablet, Rfl: 5   fluticasone (FLONASE) 50 MCG/ACT nasal spray, TWO SPRAYS EACH NOSTRIL ONCE A DAY, Disp: 1 g, Rfl: 0   ibuprofen (  ADVIL,MOTRIN) 800 MG tablet, Take 1 tablet (800 mg total) by mouth every 8 (eight) hours as needed (mild pain)., Disp: 30 tablet, Rfl: 0   natalizumab (TYSABRI) 300 MG/15ML injection, Inject 300 mg into the vein every 30 (thirty) days., Disp: , Rfl:    oxybutynin (DITROPAN-XL) 10 MG 24 hr tablet, TAKE 1 TABLET BY MOUTH  DAILY AT BEDTIME, Disp: 90 tablet, Rfl: 1   rizatriptan (MAXALT-MLT) 10 MG disintegrating tablet, TAKE 1 TABLET BY MOUTH AS NEEDED FOR MIGRAINE. MAY REPEAT IN 2 HOURS IF NEEDED, Disp: 9 tablet, Rfl: 3   topiramate (TOPAMAX) 50 MG tablet, Take 1 tablet (50 mg total) by mouth 2 (two) times daily., Disp: 90 tablet, Rfl: 1   triamterene-hydrochlorothiazide (MAXZIDE-25) 37.5-25 MG tablet, TAKE 1 TABLET BY MOUTH  DAILY, Disp: 90 tablet, Rfl: 3  PAST MEDICAL HISTORY: Past Medical History:  Diagnosis Date   Allergic rhinitis due to pollen 02/06/2010   Esophageal reflux 11/29/2008   HEADACHE, CLUSTER, CHRONIC 02/06/2010   patient reports migraines, not cluster headaches from problem list   Hypertension    Multiple sclerosis ([place])    Neuromuscular disorder ([place])    multiple sclerosis    PAST SURGICAL HISTORY: Past Surgical History:  Procedure Laterality Date   CESAREAN SECTION  06/29/2012   Procedure: CESAREAN SECTION;  Surgeon:  Allena Katz, MD;  Location: [facility];  Service: Obstetrics;  Laterality: N/A;   IUD REMOVAL  2018   MYOMECTOMY N/A 02/21/2018   Procedure: MYOMECTOMY, ABDOMINAL;  Surgeon: Molli Posey, MD;  Location: [facility];  Service: Gynecology;  Laterality: N/A;    FAMILY HISTORY: Family History  Problem Relation Age of Onset   Lung cancer Maternal Grandfather    Hypertension Mother    Hypertension Father    Mental illness Maternal Grandmother    Diabetes Maternal Grandmother    Glaucoma Paternal Grandmother    Hypertension Paternal Grandfather     SOCIAL HISTORY:  Social History   Socioeconomic History   Marital status: Single    Spouse name: Not on file   Number of children: 1   Years of education: Not on file   Highest education level: Not on file  Occupational History   Not on file  Tobacco Use   Smoking status: Never   Smokeless tobacco: Never  Vaping Use   Vaping Use: Never used  Substance and Sexual Activity   Alcohol use: Yes    Alcohol/week: 0.0 - 1.0 standard drinks of alcohol    Comment: occ   Drug use: No   Sexual activity: Yes    Birth control/protection: Implant  Other Topics Concern   Not on file  Social History Narrative   Lives at home with son (born 2013)     Father of son lives in [place]- long term relationship      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    Prior  Advertising account executive (Pharmacist, community) Garment/textile technologist.        Wears glasses.   Caffeine none.   Social Determinants of Health   Financial Resource Strain: Not on file  Food Insecurity: Not on file  Transportation Needs: Not on file  Physical Activity: Not on file  Stress: Not on file  Social Connections: Not on file  Intimate Partner Violence: Not on file     PHYSICAL EXAM  Vitals:   09/22/22 1619  BP: (!) 148/93  Pulse: 70  Weight: 227 lb 8 oz (103.2 kg)  Height: '5\' 5"'$  (1.651 m)    Body mass index is  37.86 kg/m.   General: The patient is well-developed and well-nourished and in no acute distress  Extremities:    No rash or edema.  Neurologic Exam  Mental status: The patient is alert and oriented x 3 at the time of the examination. The patient has apparent normal recent and remote memory, with an apparently normal attention span and concentration ability.   Speech is normal.  Cranial nerves: Extraocular movements are full..  Facial strength is normal... No obvious hearing deficits are noted.  Motor: Muscle bulk, tone and strength are normal.  Sensory: Sensory testing is intact to touch and vibration sensation in the arms and legs.   Coordination: Cerebellar testing reveals good finger-nose-finger and heel-to-shin bilaterally.  Gait and station: The station is normal and the gait is normal.  The tandem gait is minimally wide .  Romberg is negative.    Reflexes: Deep tendon reflexes are normal and symmetric bilaterally     DIAGNOSTIC DATA (LABS, IMAGING, TESTING) - I reviewed patient records, labs, notes, testing and imaging myself where available.  Lab Results  Component Value Date   WBC 10.2 09/06/2022   HGB 12.7 09/06/2022   HCT 36.7 09/06/2022   MCV 84 09/06/2022   PLT 347 09/06/2022      Component Value Date/Time   NA 134 (L) 01/19/2022 1153   K 3.5 01/19/2022 1153   CL 100 01/19/2022 1153   CO2 24 01/19/2022 1153   GLUCOSE 109 (H) 01/19/2022 1153   BUN 15 01/19/2022 1153   CREATININE 0.89 01/19/2022 1153   CREATININE 0.87 07/24/2020 1342   CALCIUM 9.8 01/19/2022 1153   PROT 7.9 01/19/2022 1153   ALBUMIN 4.4 01/19/2022 1153   ALBUMIN 3,800 03/31/2016 1024   AST 19 01/19/2022 1153   ALT 15 01/19/2022 1153   ALKPHOS 60 01/19/2022 1153   BILITOT 0.4 01/19/2022 1153   GFRNONAA 88 07/24/2020 1342   GFRAA 101 07/24/2020 1342    Lab Results  Component Value Date   TSH 1.05 07/24/2020    _________________________________________________________________ Assessment and Plan:  Relapsing remitting multiple sclerosis (HCC)  Migraine with aura and without status migrainosus, not intractable  High risk medication use  OSA (obstructive sleep apnea)  Urinary frequency   1.   Continue Tysabri.  She is JCV Ab negative 2.  Stay active and exercise as tolerated. 3.   Topiramate 50 mg nightly for migraines.   4.   Continue baclofen as needed.  Continue oxybutynin for bladder.  5.   She has OSA (in 2021 AHI = 12.2).  At that time ESS was 7 but now she has more sleepiness (AHI = 14) and we can start APAP 5-16 cm with H/H and d/l in 30-90 days. 6.  She will return to see [place]  in 6 months or sooner if there are new or worsening neurologic symptoms.   Emarie Paul A. Felecia Shelling, MD, PhD 64/02/346, 4:25 PM Certified in Neurology, Clinical Neurophysiology, Sleep Medicine, Pain Medicine and Neuroimaging  [facility] [address] [place], [place] [address] [phone]

## 2022-09-23 ENCOUNTER — Telehealth: Payer: Self-pay | Admitting: *Deleted

## 2022-09-23 DIAGNOSIS — G4733 Obstructive sleep apnea (adult) (pediatric): Secondary | ICD-10-CM

## 2022-09-23 DIAGNOSIS — R5383 Other fatigue: Secondary | ICD-10-CM

## 2022-09-23 NOTE — Telephone Encounter (Signed)
-----   Message from Britt Bottom, MD sent at 09/22/2022  4:57 PM EST ----- Regarding: APAP She has OSA (in 2021 AHI = 12.2).  At that time ESS was 7 but now she has more sleepiness (AHI = 14) and we can start APAP 5-16 cm with H/H and d/l in 30-90 days.  The PSG is under notes (not encounters)09/14/2020

## 2022-09-23 NOTE — Telephone Encounter (Signed)
Called pt. Relayed Dr. Garth Bigness message. Pt is agreeable to starting a CPAP. I advised pt that an order will be sent to a DME, [facility], and [facility] will call the pt within about one week after they file with the pt's insurance. [facility] will show the pt how to use the machine, fit for masks, and troubleshoot the CPAP if needed. Provided pt [facility] phone# [phone] in case they do not reach out in the next week and she can call. A follow up appt was made for insurance purposes with Dr. Felecia Shelling 12/06/22 at 1:00pm. Pt verbalized understanding to arrive 15 minutes early and bring their CPAP. Pt had no questions at this time but was encouraged to call back if questions arise. I have sent the order to [facility] and have received confirmation that they have received the order.

## 2022-09-28 NOTE — Addendum Note (Signed)
Addended by: Roberts Gaudy L on: 09/28/2022 01:09 PM   Modules accepted: Orders

## 2022-09-28 NOTE — Telephone Encounter (Signed)
LVM at [phone] for pt to call.

## 2022-10-19 ENCOUNTER — Telehealth: Payer: Self-pay | Admitting: Neurology

## 2022-10-19 NOTE — Telephone Encounter (Signed)
HST- [employer] no auth req.  Patient is scheduled at [facility] for 11/09/22 at 2 pm.  Mailed packet to the patient.

## 2022-10-20 ENCOUNTER — Ambulatory Visit (INDEPENDENT_AMBULATORY_CARE_PROVIDER_SITE_OTHER): Payer: 59 | Admitting: Physician Assistant

## 2022-10-20 ENCOUNTER — Encounter: Payer: Self-pay | Admitting: Physician Assistant

## 2022-10-20 ENCOUNTER — Ambulatory Visit (INDEPENDENT_AMBULATORY_CARE_PROVIDER_SITE_OTHER)
Admission: RE | Admit: 2022-10-20 | Discharge: 2022-10-20 | Disposition: A | Discharge status: Home / Self Care | Payer: 59 | Source: Ambulatory Visit | Attending: Physician Assistant | Admitting: Physician Assistant

## 2022-10-20 VITALS — BP 114/76 | HR 88 | Temp 97.5°F | Ht 65.0 in | Wt 221.2 lb

## 2022-10-20 DIAGNOSIS — R051 Acute cough: Secondary | ICD-10-CM

## 2022-10-20 MED ORDER — BENZONATATE 100 MG PO CAPS: 100.0000 mg | ORAL_CAPSULE | Freq: Three times a day (TID) | ORAL | 0 refills | Status: AC | PRN

## 2022-10-20 NOTE — Progress Notes (Signed)
Subjective:    Patient ID: [name], female    DOB: 11/27/1985, 36 y.o.   MRN: [id number]  Chief Complaint  Patient presents with   Cough    Pt in office for persistent cough, and SOB from simply walking and simple activity, tested negative for Covid; cough started beginning of Nov and got a little better but then came back a few weeks later tested for Covid again still negative, then back again this week same thing still negative for Covid.     Cough   Patient is in today for cough x 1 month. Thought it was going away, but lingering. Sometimes more short of breath than other times. Started initially with some sinus drainage, but no major sickness. OTC medications not helping. No sick contacts. No travel. No asthma or COPD. No smoking or vaping.   Past Medical History:  Diagnosis Date   Allergic rhinitis due to pollen 02/06/2010   Esophageal reflux 11/29/2008   HEADACHE, CLUSTER, CHRONIC 02/06/2010   patient reports migraines, not cluster headaches from problem list   Hypertension    Multiple sclerosis ([place])    Neuromuscular disorder ([place])    multiple sclerosis    Past Surgical History:  Procedure Laterality Date   CESAREAN SECTION  06/29/2012   Procedure: CESAREAN SECTION;  Surgeon: Allena Katz, MD;  Location: [facility];  Service: Obstetrics;  Laterality: N/A;   IUD REMOVAL  2018   MYOMECTOMY N/A 02/21/2018   Procedure: MYOMECTOMY, ABDOMINAL;  Surgeon: Molli Posey, MD;  Location: [facility];  Service: Gynecology;  Laterality: N/A;    Family History  Problem Relation Age of Onset   Lung cancer Maternal Grandfather    Hypertension Mother    Hypertension Father    Mental illness Maternal Grandmother    Diabetes Maternal Grandmother    Glaucoma Paternal Grandmother    Hypertension Paternal Grandfather     Social History   Tobacco Use   Smoking status: Never   Smokeless tobacco: Never  Vaping Use   Vaping Use: Never used  Substance Use Topics   Alcohol  use: Yes    Alcohol/week: 0.0 - 1.0 standard drinks of alcohol    Comment: occ   Drug use: No     Allergies  Allergen Reactions   Amoxicillin Rash    Review of Systems  Respiratory:  Positive for cough.    NEGATIVE UNLESS OTHERWISE INDICATED IN HPI      Objective:     BP 114/76 (BP Location: Right Arm)   Pulse 88   Temp (!) 97.5 F (36.4 C) (Temporal)   Ht '5\' 5"'$  (1.651 m)   Wt 221 lb 3.2 oz (100.3 kg)   SpO2 99%   BMI 36.81 kg/m   Wt Readings from Last 3 Encounters:  10/20/22 221 lb 3.2 oz (100.3 kg)  09/22/22 227 lb 8 oz (103.2 kg)  07/27/22 222 lb 6.4 oz (100.9 kg)    BP Readings from Last 3 Encounters:  10/20/22 114/76  09/22/22 (!) 148/93  07/27/22 120/78     Physical Exam Vitals and nursing note reviewed.  Constitutional:      Appearance: Normal appearance. She is normal weight. She is not toxic-appearing.  HENT:     Head: Normocephalic and atraumatic.     Right Ear: Tympanic membrane, ear canal and external ear normal.     Left Ear: Tympanic membrane, ear canal and external ear normal.     Nose: Nose normal.  Eyes:  Extraocular Movements: Extraocular movements intact.     Conjunctiva/sclera: Conjunctivae normal.     Pupils: Pupils are equal, round, and reactive to light.  Cardiovascular:     Rate and Rhythm: Normal rate and regular rhythm.     Pulses: Normal pulses.     Heart sounds: Normal heart sounds.  Pulmonary:     Effort: Pulmonary effort is normal.     Breath sounds: Normal breath sounds.  Musculoskeletal:     Cervical back: Normal range of motion and neck supple.  Skin:    General: Skin is warm and dry.  Neurological:     General: No focal deficit present.     Mental Status: She is alert and oriented to person, place, and time.  Psychiatric:        Mood and Affect: Mood normal.        Behavior: Behavior normal.        Assessment & Plan:  Acute cough -     DG Chest 2 View; Future  Other orders -     Benzonatate; Take 1  capsule (100 mg total) by mouth 3 (three) times daily as needed for up to 7 days for cough.  Dispense: 30 capsule; Refill: 0  Cough x 1 month. Intermittent SOB, pulse ox and vitals normal on exam today. Suspect initial viral etiology and ongoing postviral cough, but will plan for CXR to r/o underlying pathology, treat pending results. Rx for tessalon perles to help with cough symptoms.     Return if symptoms worsen or fail to improve.     Tedric Leeth M Adriona Kaney, PA-C

## 2022-11-09 ENCOUNTER — Ambulatory Visit: Payer: 59 | Admitting: Neurology

## 2022-11-09 DIAGNOSIS — R5383 Other fatigue: Secondary | ICD-10-CM

## 2022-11-09 DIAGNOSIS — G4733 Obstructive sleep apnea (adult) (pediatric): Secondary | ICD-10-CM | POA: Diagnosis not present

## 2022-11-09 NOTE — Progress Notes (Signed)
[facility]  HOME SLEEP STUDY  STUDY DATE: 11/09/2022 PATIENT NAME: [name] DOB: 10-Jul-1986 MRN: [id number]  ORDERING CLINICIAN: Ugochi Henzler A. Epimenio Foot, MD, PhD REFERRING CLINICIAN: Shavone Nevers A. Epimenio Foot, MD. PhD   CLINICAL INFORMATION: 36 year old woman with obstructive sleep apnea   IMPRESSION:  Normal sleep efficiency. Moderate OSA with a 3% pAHI of 19.9/h.  OSA was severe during REM with a REM pAHI 3% of 48/h Negligible nocturnal hypoxemia with 3.6 minutes below 88%   RECOMMENDATION: AutoPap 5 to 15 cm H2O with heated humidifier Download in 30 to 90 days Follow-up with Dr. Epimenio Foot   INTERPRETING PHYSICIAN:   Pearletha Furl. Epimenio Foot, MD, PhD, [facility] Certified in Neurology, Clinical Neurophysiology, Sleep Medicine, Pain Medicine and Neuroimaging  [facility] [address], [address] [place], [place] [address] [phone]                           [facility]  PATIENT: [name] DOB: [birth year]  REFERRING DOCTOR OR PCP:  Tana Conch SOURCE: Patient, notes from Hospital, lab and other results from Hospital, MRI images personally reviewed on PACS  _________________________________   HISTORICAL  CHIEF COMPLAINT:  No chief complaint on file.   HISTORY OF PRESENT ILLNESS:  [name] is a 36 y.o. woman with relapsing remitting MS.       Update 09/22/2022: She feels her MS is stable and she denies any exacerbations or new symptoms.    She is on Tysabri and tolerates it well.  Her last MRI (brain and cervical spine) was 02/20/2020.  There were no new lesions.  JCV Ab was negative 09/23/2021 (0.27)      Gait is doing well.   Se has no falls.   She can walk 2 miles straight.    She can go up and down stairs well. And only uses a bannister sometimes.     She does daily exercises.    Strength is stable and the right side is slightly weaker than her left (noted with working out).   Muscle cramps are better.  She denies tingling or numbness.    Her urinary urgency is helped by oxybutynin.    She has no new vision changes.    She has some fatigue  more than last year.  She is sleeping well most night (6-8 hours) but does wake up 1-2 times nightly   She takes naps.  She snores.   She had a sleep study in 2021 showing mild OSA=12.2.  A dental appliance was recmmended but insurance did not cover..   Mood is doing ok.   She is back n buspirone for anxiety.   It has helped.     Migraines occur 1/7 days a week down form 4/7 since starting TPM.   They are helped by Maxalt     EPWORTH SLEEPINESS SCALE  On a scale of 0 - 3 what is the chance of dozing:  Sitting and Reading:   3 Watching TV:     3 Sitting inactive in a public place: 0 Passenger in car for one hour: 3 Lying down to rest in the afternoon: 3 Sitting and talking to someone: 0 Sitting quietly after lunch:  2 In a car, stopped in traffic:  0  Total (out of 24):   14/24 (mild to moderate EDS)   MS History:    In May, 2017,  she had the onset of right arm numbness and  weakness that developed over one day.    She went to the emergency room and an MRI of the brain on 03/28/2016 showed multiple T2/FLAIR hyperintense foci consistent with multiple sclerosis. She was admitted and received 5 days of IV steroids. While in the hospital she had a contrasted MRI of the brain that showed that 2 of those lesions enhanced consistent with more acute onset. Additionally, she had 2 lesions in the cervical spine, both enhancing. The one at C6 was towards the right and could explain her symptoms. Another one was adjacent to C3-C4, posteriorly. She also had a lumbar puncture. The cerebrospinal fluid shows that there were 9 oligoclonal bands in the CSF which is also consistent with multiple sclerosis. Due to the aggressiveness of her MS, she was started on Tysabri. She is JCV antibody negative (0.25).   In retrospect, in 2011, she had  headaches and tremors and had an MRI of the brain. It showed a single T2/FLAIR hyperintense focus in the right periventricular white matter of the parietal lobe.   Follow-up MRI in 2012 did not show any new lesions. The symptoms improved and she had no other symptoms until 2017  Imaging: MRI of the brain 03/16/2022 showed Multiple T2/FLAIR hyperintense foci in the hemispheres in a pattern consistent with chronic demyelinating plaque associated with multiple sclerosis.  None of the foci enhanced.  They do not appear to be acute.  Compared to the MRI from 03/11/2020, there were no new lesions.   MRI of the cervical spine 03/16/2022 Two small foci within the spinal cord adjacent to C3-C4 and C6 consistent with chronic demyelinating plaque associated with multiple sclerosis.  2.   Minimal disc degenerative changes at C3-C4 and C4-C5 not leading to spinal stenosis or nerve root compression MRI of the cervical spine 03/11/2020 shows T2 hyperintense foci within the spinal cord posteriorly adjacent to C3-C4 and posterolaterally to the right adjacent to C6.  Minimal disc degenerative changes at C3-C4.  MRI of the brain 03/11/2020 shows Multiple T2/FLAIR hyperintense foci in the hemispheres in a pattern and configuration consistent with chronic demyelinating plaque associated with multiple sclerosis.  None of the foci appear to be acute and they were all present on the 08/29/2018 MRI.  MRI of the brain 08/29/2018 shows T2/FLAIR hyperintense foci in the hemispheres and left pons in a pattern and configuration consistent with chronic demyelinating plaque associated with multiple sclerosis.  None of the foci appear to be acute.  When compared to the MRI from May 2017, there are no new lesions.  Some of the foci that were acute with enhancement in 2017 are smaller on the current study and no longer enhance.  MRI of the thoracic spine 03/29/2016 was normal    REVIEW OF SYSTEMS: Review of Systems  Constitutional:   Positive for malaise/fatigue.  HENT:  Negative for congestion and hearing loss.   Eyes: Negative.  Negative for blurred vision, double vision, photophobia and pain.  Respiratory:  Negative for cough.   Cardiovascular:  Negative for chest pain.  Gastrointestinal:  Negative for abdominal pain, nausea and vomiting.  Genitourinary:  Positive for frequency and urgency. Negative for dysuria.  Musculoskeletal:  Negative for back pain and neck pain.  Skin:  Negative for itching and rash.  Neurological: Negative.   Endo/Heme/Allergies: Negative.   Psychiatric/Behavioral:  Negative for depression. The patient has insomnia.     ALLERGIES: Allergies  Allergen Reactions   Amoxicillin Rash    HOME MEDICATIONS:  Current Outpatient Medications:  ALPRAZolam (XANAX) 0.5 MG tablet, Take 2 tablets 30 min. prior to MRI.  May take one more if needed immediately before MRI.  You must not drink alcohol, drive, or operate dangerous equipment after taking this medication., Disp: 3 tablet, Rfl: 0   amLODipine (NORVASC) 2.5 MG tablet, Take 1 tablet (2.5 mg total) by mouth daily., Disp: 90 tablet, Rfl: 3   baclofen (LIORESAL) 10 MG tablet, Take 1 tablet (10 mg total) by mouth 3 (three) times daily., Disp: 270 tablet, Rfl: 1   busPIRone (BUSPAR) 5 MG tablet, TAKE 1 TABLET BY MOUTH 3 TIMES DAILY AS NEEDED., Disp: 60 tablet, Rfl: 2   cetirizine (ZYRTEC) 10 MG tablet, TAKE 1 TABLET BY MOUTH EVERY DAY AS NEEDED FOR ALLERGY, Disp: 30 tablet, Rfl: 5   fluticasone (FLONASE) 50 MCG/ACT nasal spray, TWO SPRAYS EACH NOSTRIL ONCE A DAY, Disp: 1 g, Rfl: 0   ibuprofen (ADVIL,MOTRIN) 800 MG tablet, Take 1 tablet (800 mg total) by mouth every 8 (eight) hours as needed (mild pain)., Disp: 30 tablet, Rfl: 0   natalizumab (TYSABRI) 300 MG/15ML injection, Inject 300 mg into the vein every 30 (thirty) days., Disp: , Rfl:    oxybutynin (DITROPAN-XL) 10 MG 24 hr tablet, TAKE 1 TABLET BY MOUTH  DAILY AT BEDTIME, Disp: 90 tablet, Rfl:  1   rizatriptan (MAXALT-MLT) 10 MG disintegrating tablet, TAKE 1 TABLET BY MOUTH AS NEEDED FOR MIGRAINE. MAY REPEAT IN 2 HOURS IF NEEDED, Disp: 9 tablet, Rfl: 3   topiramate (TOPAMAX) 50 MG tablet, Take 1 tablet (50 mg total) by mouth 2 (two) times daily., Disp: 90 tablet, Rfl: 1   triamterene-hydrochlorothiazide (MAXZIDE-25) 37.5-25 MG tablet, TAKE 1 TABLET BY MOUTH  DAILY, Disp: 90 tablet, Rfl: 3  PAST MEDICAL HISTORY: Past Medical History:  Diagnosis Date   Allergic rhinitis due to pollen 02/06/2010   Esophageal reflux 11/29/2008   HEADACHE, CLUSTER, CHRONIC 02/06/2010   patient reports migraines, not cluster headaches from problem list   Hypertension    Multiple sclerosis (HCC)    Neuromuscular disorder (HCC)    multiple sclerosis    PAST SURGICAL HISTORY: Past Surgical History:  Procedure Laterality Date   CESAREAN SECTION  06/29/2012   Procedure: CESAREAN SECTION;  Surgeon: Leslie Andrea, MD;  Location: [facility];  Service: Obstetrics;  Laterality: N/A;   IUD REMOVAL  2018   MYOMECTOMY N/A 02/21/2018   Procedure: MYOMECTOMY, ABDOMINAL;  Surgeon: Richarda Overlie, MD;  Location: [facility];  Service: Gynecology;  Laterality: N/A;    FAMILY HISTORY: Family History  Problem Relation Age of Onset   Lung cancer Maternal Grandfather    Hypertension Mother    Hypertension Father    Mental illness Maternal Grandmother    Diabetes Maternal Grandmother    Glaucoma Paternal Grandmother    Hypertension Paternal Grandfather     SOCIAL HISTORY:  Social History   Socioeconomic History   Marital status: Single    Spouse name: Not on file   Number of children: 1   Years of education: Not on file   Highest education level: Not on file  Occupational History   Not on file  Tobacco Use   Smoking status: Never   Smokeless tobacco: Never  Vaping Use   Vaping Use: Never used  Substance and Sexual Activity   Alcohol use: Yes    Alcohol/week: 0.0 - 1.0 standard drinks of alcohol     Comment: occ   Drug use: No   Sexual activity: Yes  Birth control/protection: Implant  Other Topics Concern   Not on file  Social History Narrative   Lives at home with son (born 2013)     Father of son lives in [place]- long term relationship      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    Prior  Forensic psychologist (Research officer, political party) Scientist, physiological.        Wears glasses.   Caffeine none.   Social Determinants of Health   Financial Resource Strain: Not on file  Food Insecurity: Not on file  Transportation Needs: Not on file  Physical Activity: Not on file  Stress: Not on file  Social Connections: Not on file  Intimate Partner Violence: Not on file     PHYSICAL EXAM  There were no vitals filed for this visit.   There is no height or weight on file to calculate BMI.   General: The patient is well-developed and well-nourished and in no acute distress  Extremities:    No rash or edema.  Neurologic Exam  Mental status: The patient is alert and oriented x 3 at the time of the examination. The patient has apparent normal recent and remote memory, with an apparently normal attention span and concentration ability.   Speech is normal.  Cranial nerves: Extraocular movements are full..  Facial strength is normal... No obvious hearing deficits are noted.  Motor: Muscle bulk, tone and strength are normal.  Sensory: Sensory testing is intact to touch and vibration sensation in the arms and legs.   Coordination: Cerebellar testing reveals good finger-nose-finger and heel-to-shin bilaterally.  Gait and station: The station is normal and the gait is normal.  The tandem gait is minimally wide .  Romberg is negative.    Reflexes: Deep tendon reflexes are normal and symmetric bilaterally     DIAGNOSTIC DATA (LABS, IMAGING, TESTING) - I reviewed patient records, labs, notes, testing and imaging myself where  available.  Lab Results  Component Value Date   WBC 10.2 09/06/2022   HGB 12.7 09/06/2022   HCT 36.7 09/06/2022   MCV 84 09/06/2022   PLT 347 09/06/2022      Component Value Date/Time   NA 134 (L) 01/19/2022 1153   K 3.5 01/19/2022 1153   CL 100 01/19/2022 1153   CO2 24 01/19/2022 1153   GLUCOSE 109 (H) 01/19/2022 1153   BUN 15 01/19/2022 1153   CREATININE 0.89 01/19/2022 1153   CREATININE 0.87 07/24/2020 1342   CALCIUM 9.8 01/19/2022 1153   PROT 7.9 01/19/2022 1153   ALBUMIN 4.4 01/19/2022 1153   ALBUMIN 3,800 03/31/2016 1024   AST 19 01/19/2022 1153   ALT 15 01/19/2022 1153   ALKPHOS 60 01/19/2022 1153   BILITOT 0.4 01/19/2022 1153   GFRNONAA 88 07/24/2020 1342   GFRAA 101 07/24/2020 1342    Lab Results  Component Value Date   TSH 1.05 07/24/2020   _________________________________________________________________ Assessment and Plan:  OSA (obstructive sleep apnea) - Plan: Home sleep test  Other fatigue - Plan: Home sleep test   1.   Continue Tysabri.  She is JCV Ab negative 2.  Stay active and exercise as tolerated. 3.   Topiramate 50 mg nightly for migraines.   4.   Continue baclofen as needed.  Continue oxybutynin for bladder.  5.   She has OSA (in 2021 AHI = 12.2).  At that time ESS was 7 but now she has more  sleepiness (AHI = 14) and we can start APAP 5-16 cm with H/H and d/l in 30-90 days. 6.  She will return to see [place]  in 6 months or sooner if there are new or worsening neurologic symptoms.   Anam Bobby A. Epimenio Foot, MD, PhD 11/09/2022, 2:26 PM Certified in Neurology, Clinical Neurophysiology, Sleep Medicine, Pain Medicine and Neuroimaging  [facility] [address] [place], [place] [address] [phone]

## 2022-11-16 NOTE — Telephone Encounter (Signed)
Noted  

## 2022-11-16 NOTE — Addendum Note (Signed)
Addended by: Wyvonnia Lora on: 11/16/2022 11:49 AM   Modules accepted: Orders

## 2022-11-16 NOTE — Telephone Encounter (Signed)
Pt initial cpap appt was r/s to late March.

## 2022-11-16 NOTE — Telephone Encounter (Signed)
Sent mychart to pt about results. Faxed order to [place] at [phone]. Received fax confirmation.

## 2022-11-19 ENCOUNTER — Other Ambulatory Visit: Payer: Self-pay | Admitting: Neurology

## 2022-12-06 ENCOUNTER — Ambulatory Visit: Payer: 59 | Admitting: Neurology

## 2023-01-14 ENCOUNTER — Other Ambulatory Visit: Payer: Self-pay | Admitting: Family Medicine

## 2023-01-25 ENCOUNTER — Encounter: Payer: Self-pay | Admitting: Family Medicine

## 2023-01-25 ENCOUNTER — Ambulatory Visit (INDEPENDENT_AMBULATORY_CARE_PROVIDER_SITE_OTHER): Payer: 59 | Admitting: Family Medicine

## 2023-01-25 VITALS — BP 120/70 | HR 87 | Temp 98.3°F | Ht 65.0 in | Wt 220.4 lb

## 2023-01-25 DIAGNOSIS — D849 Immunodeficiency, unspecified: Secondary | ICD-10-CM

## 2023-01-25 DIAGNOSIS — G35 Multiple sclerosis: Secondary | ICD-10-CM

## 2023-01-25 DIAGNOSIS — F419 Anxiety disorder, unspecified: Secondary | ICD-10-CM | POA: Diagnosis not present

## 2023-01-25 DIAGNOSIS — I10 Essential (primary) hypertension: Secondary | ICD-10-CM

## 2023-01-25 NOTE — Patient Instructions (Addendum)
Let me know if any big changes- we have a plan! Would stop buspirone and likely switch to methyldopa before trying for pregnancy.   Recommended follow up: Return in about 6 months (around 07/28/2023) for physical or sooner if needed.Schedule b4 you leave.

## 2023-01-25 NOTE — Progress Notes (Signed)
Phone [phone] In person visit   Subjective:   [name] is a 37 y.o. year old very pleasant female patient who presents for/with See problem oriented charting Chief Complaint  Patient presents with   Follow-up    (No mask)   Hypertension    Past Medical History-  Patient Active Problem List   Diagnosis Date Noted   Immunosuppressed status ([place]) 07/24/2020    Priority: High   Relapsing remitting multiple sclerosis ([place]) 04/09/2016    Priority: High   OAB (overactive bladder) 07/27/2022    Priority: Medium    OSA (obstructive sleep apnea) 09/15/2020    Priority: Medium    Anxiety 08/25/2018    Priority: Medium    Urinary frequency 04/09/2016    Priority: Medium    Essential hypertension 04/09/2016    Priority: Medium    Migraine 03/29/2016    Priority: Medium    Leiomyoma 02/21/2018    Priority: Low   High risk medication use 04/09/2016    Priority: Low   Allergic rhinitis due to pollen 02/06/2010    Priority: Low    Medications- reviewed and updated Current Outpatient Medications  Medication Sig Dispense Refill   ALPRAZolam (XANAX) 0.5 MG tablet Take 2 tablets 30 min. prior to MRI.  May take one more if needed immediately before MRI.  You must not drink alcohol, drive, or operate dangerous equipment after taking this medication. 3 tablet 0   amLODipine (NORVASC) 2.5 MG tablet Take 1 tablet (2.5 mg total) by mouth daily. 90 tablet 3   baclofen (LIORESAL) 10 MG tablet Take 1 tablet (10 mg total) by mouth 3 (three) times daily. 270 tablet 1   busPIRone (BUSPAR) 5 MG tablet TAKE 1 TABLET BY MOUTH 3 TIMES DAILY AS NEEDED. 60 tablet 2   cetirizine (ZYRTEC) 10 MG tablet TAKE 1 TABLET BY MOUTH EVERY DAY AS NEEDED FOR ALLERGY 90 tablet 1   fluticasone (FLONASE) 50 MCG/ACT nasal spray TWO SPRAYS EACH NOSTRIL ONCE A DAY 1 g 0   ibuprofen (ADVIL,MOTRIN) 800 MG tablet Take 1 tablet (800 mg total) by mouth every 8 (eight) hours as needed (mild pain). 30 tablet 0    natalizumab (TYSABRI) 300 MG/15ML injection Inject 300 mg into the vein every 30 (thirty) days.     oxybutynin (DITROPAN-XL) 10 MG 24 hr tablet TAKE 1 TABLET BY MOUTH  DAILY AT BEDTIME 90 tablet 1   rizatriptan (MAXALT-MLT) 10 MG disintegrating tablet TAKE 1 TABLET BY MOUTH AS NEEDED FOR MIGRAINE. MAY REPEAT IN 2 HOURS IF NEEDED 9 tablet 3   topiramate (TOPAMAX) 50 MG tablet Take 1 tablet (50 mg total) by mouth 2 (two) times daily. 90 tablet 1   triamterene-hydrochlorothiazide (MAXZIDE-25) 37.5-25 MG tablet TAKE 1 TABLET BY MOUTH  DAILY 90 tablet 3   No current facility-administered medications for this visit.     Objective:  BP 120/70   Pulse 87   Temp 98.3 F (36.8 C)   Ht '5\' 5"'$  (1.651 m)   Wt 220 lb 6.4 oz (100 kg)   BMI 36.68 kg/m  Gen: NAD, resting comfortably CV: RRR no murmurs rubs or gallops Lungs: CTAB no crackles, wheeze, rhonchi Ext: no edema Skin: warm, dry     Assessment and Plan   #social update- will be relocating to [place] at end of school year- excited. Will be for work.  Considering trying for pregnancy   #Hypertension S: Compliant with triamterene hydrochlorothiazide 37.5-25 mg, amlodipine 2.5 mg -lookgin back was on  methyldopa '250mg'$  twice daily with 1st pregnancy and she repots did very well with that- blood pressure actually lower than post pregnancy A/P: Blood pressure very well-controlled-continue current medication.  We discussed if she does decide to pursue pregnancy I would like to try her on methyldopa before she gets pregnant and monitor blood pressure  % Multiple sclerosis S: Patient is doing reasonably well on Tysabri.  Continues to follow with Dr. Felecia Shelling. A/P: Controlled. Continue current medications.  Labs have been stable with CBC and JCV antibody and has upcoming visit with neurology. - We discussed she needs to discuss medications from neurology with them including Tysabri, Topamax (for migraines) if she were to become pregnant  #OSA on  cpap- started in early 2024   %Overactive bladder/urinary frequency- oxybutynin-prescribed by neurology-recommended discussing with implant if she were to become pregnant  #Anxiety-well-controlled with very sparingly using busprione- if chooses to get pregnant would likely hold medication.  With such sparing use could certainly hold off of medication if becomes pregnant-if anxiety worsens would look more at SSRIs  Recommended follow up: Return in about 6 months (around 07/28/2023) for physical or sooner if needed.Schedule b4 you leave. Future Appointments  Date Time Provider [place]  02/07/2023  9:00 AM Debbora Presto, NP [facility] None  08/01/2023  3:00 PM Marin Olp, MD [id number]    Lab/Order associations:   ICD-10-CM   1. Essential hypertension  I10     2. Relapsing remitting multiple sclerosis ([place])  G35     3. Immunosuppressed status ([place])  D84.9     4. Anxiety  F41.9      Return precautions advised.  Garret Reddish, MD

## 2023-02-03 NOTE — Progress Notes (Signed)
PATIENT: [name] DOB: 06/17/1986  REASON FOR VISIT: follow up HISTORY FROM: patient  Chief Complaint  Patient presents with   Room 16    Pt is here Alone. Pt states that things have been going good. Pt states that she has some headaches. Pt states that she has some fatigue throughout the day. Pt states no dry mouth or soar throat with CPAP Machine.    HISTORY OF PRESENT ILLNESS:  02/07/23 ALL:  [name] is a 37 y.o. female here today for follow up for RRMS. She continues Tysabri infusions. MRI brain and cervical spine stable in 03/2022. Labs have been unremarkable, JCV from 09/06/2022 0.27, negative.   She is doing fairly well, today. She denies changes in gait. She continues to work out at the gym 4-5 days a week. She does cardio and HIT workouts. She usually does not need to take baclofen every day. It helps with muscle spasms. No numbness/tingling. Mood seems fairly stable. She continues Buspar 5mg  TID per PCP. She is sleeping well.   Headaches have settled down a bit. She continues topiramate 50mg  BID. Rizatriptan works well, usually only needs 1-2 per month.   Oxybutynin 10mg  daily helps with urinary frequency. No incontinence. No vision changes. Last eye exam in 2023 was unremarkable.   She was recently set up on CPAP therapy. She is adjusting fairly well. She admits she is not using nightly but is trying to improve compliance. She does feel it may be helping with headaches and fatigue.     History (copied from Dr Garth Bigness previous note):  [name] is a 37 y.o. woman with relapsing remitting MS.        Update 09/22/2022: She feels her MS is stable and she denies any exacerbations or new symptoms.    She is on Tysabri and tolerates it well.  Her last MRI (brain and cervical spine) was 02/20/2020.  There were no new lesions.  JCV Ab was negative 09/23/2021 (0.27)       Gait is doing well.   Se has no falls.   She can walk 2 miles straight.    She can  go up and down stairs well. And only uses a bannister sometimes.     She does daily exercises.    Strength is stable and the right side is slightly weaker than her left (noted with working out).  Muscle cramps are better.  She denies tingling or numbness.    Her urinary urgency is helped by oxybutynin.    She has no new vision changes.     She has some fatigue  more than last year.  She is sleeping well most night (6-8 hours) but does wake up 1-2 times nightly   She takes naps.  She snores.   She had a sleep study in 2021 showing mild OSA=12.2.  A dental appliance was recmmended but insurance did not cover..   Mood is doing ok.   She is back n buspirone for anxiety.   It has helped.      Migraines occur 1/7 days a week down form 4/7 since starting TPM.   They are helped by Maxalt      EPWORTH SLEEPINESS SCALE   On a scale of 0 - 3 what is the chance of dozing:   Sitting and Reading:  3 Watching TV:                                                  3 Sitting inactive in a public place:        0 Passenger in car for one hour:           3 Lying down to rest in the afternoon:   3 Sitting and talking to someone:          0 Sitting quietly after lunch:                   2 In a car, stopped in traffic:                  0   Total (out of 24):   14/24 (mild to moderate EDS)     MS History:    In May, 2017,  she had the onset of right arm numbness and weakness that developed over one day.    She went to the emergency room and an MRI of the brain on 03/28/2016 showed multiple T2/FLAIR hyperintense foci consistent with multiple sclerosis. She was admitted and received 5 days of IV steroids. While in the hospital she had a contrasted MRI of the brain that showed that 2 of those lesions enhanced consistent with more acute onset. Additionally, she had 2 lesions in the cervical spine, both enhancing. The one at C6 was towards the right and could explain her symptoms. Another one was  adjacent to C3-C4, posteriorly. She also had a lumbar puncture. The cerebrospinal fluid shows that there were 9 oligoclonal bands in the CSF which is also consistent with multiple sclerosis. Due to the aggressiveness of her MS, she was started on Tysabri. She is JCV antibody negative (0.25).   In retrospect, in 2011, she had headaches and tremors and had an MRI of the brain. It showed a single T2/FLAIR hyperintense focus in the right periventricular white matter of the parietal lobe.   Follow-up MRI in 2012 did not show any new lesions. The symptoms improved and she had no other symptoms until 2017   Imaging: MRI of the brain 03/16/2022 showed Multiple T2/FLAIR hyperintense foci in the hemispheres in a pattern consistent with chronic demyelinating plaque associated with multiple sclerosis.  None of the foci enhanced.  They do not appear to be acute.  Compared to the MRI from 03/11/2020, there were no new lesions.    MRI of the cervical spine 03/16/2022 Two small foci within the spinal cord adjacent to C3-C4 and C6 consistent with chronic demyelinating plaque associated with multiple sclerosis.   2.   Minimal disc degenerative changes at C3-C4 and C4-C5 not leading to spinal stenosis or nerve root compression MRI of the cervical spine 03/11/2020 shows T2 hyperintense foci within the spinal cord posteriorly adjacent to C3-C4 and posterolaterally to the right adjacent to C6.  Minimal disc degenerative changes at C3-C4.   MRI of the brain 03/11/2020 shows Multiple T2/FLAIR hyperintense foci in the hemispheres in a pattern and configuration consistent with chronic demyelinating plaque associated with multiple sclerosis.  None of the foci appear to be acute and they were all present on the 08/29/2018 MRI.   MRI of the brain 08/29/2018 shows T2/FLAIR hyperintense foci in the hemispheres and left pons in a pattern and configuration  consistent with chronic demyelinating plaque associated with multiple sclerosis.  None  of the foci appear to be acute.  When compared to the MRI from May 2017, there are no new lesions.  Some of the foci that were acute with enhancement in 2017 are smaller on the current study and no longer enhance.   MRI of the thoracic spine 03/29/2016 was normal  REVIEW OF SYSTEMS: Out of a complete 14 system review of symptoms, the patient complains only of the following symptoms, fatigue, snoring, anxiety, and all other reviewed systems are negative.  ESS: 5/24. Previously 14/24  ALLERGIES: Allergies  Allergen Reactions   Amoxicillin Rash    HOME MEDICATIONS: Outpatient Medications Prior to Visit  Medication Sig Dispense Refill   ALPRAZolam (XANAX) 0.5 MG tablet Take 2 tablets 30 min. prior to MRI.  May take one more if needed immediately before MRI.  You must not drink alcohol, drive, or operate dangerous equipment after taking this medication. 3 tablet 0   amLODipine (NORVASC) 2.5 MG tablet Take 1 tablet (2.5 mg total) by mouth daily. 90 tablet 3   baclofen (LIORESAL) 10 MG tablet Take 1 tablet (10 mg total) by mouth 3 (three) times daily. 270 tablet 1   busPIRone (BUSPAR) 5 MG tablet TAKE 1 TABLET BY MOUTH 3 TIMES DAILY AS NEEDED. 60 tablet 2   cetirizine (ZYRTEC) 10 MG tablet TAKE 1 TABLET BY MOUTH EVERY DAY AS NEEDED FOR ALLERGY 90 tablet 1   fluticasone (FLONASE) 50 MCG/ACT nasal spray TWO SPRAYS EACH NOSTRIL ONCE A DAY 1 g 0   ibuprofen (ADVIL,MOTRIN) 800 MG tablet Take 1 tablet (800 mg total) by mouth every 8 (eight) hours as needed (mild pain). 30 tablet 0   natalizumab (TYSABRI) 300 MG/15ML injection Inject 300 mg into the vein every 30 (thirty) days.     oxybutynin (DITROPAN-XL) 10 MG 24 hr tablet TAKE 1 TABLET BY MOUTH  DAILY AT BEDTIME 90 tablet 1   rizatriptan (MAXALT-MLT) 10 MG disintegrating tablet TAKE 1 TABLET BY MOUTH AS NEEDED FOR MIGRAINE. MAY REPEAT IN 2 HOURS IF NEEDED 9 tablet 3   topiramate (TOPAMAX) 50 MG tablet Take 1 tablet (50 mg total) by mouth 2 (two) times  daily. 90 tablet 1   triamterene-hydrochlorothiazide (MAXZIDE-25) 37.5-25 MG tablet TAKE 1 TABLET BY MOUTH  DAILY 90 tablet 3   No facility-administered medications prior to visit.    PAST MEDICAL HISTORY: Past Medical History:  Diagnosis Date   Allergic rhinitis due to pollen 02/06/2010   Esophageal reflux 11/29/2008   HEADACHE, CLUSTER, CHRONIC 02/06/2010   patient reports migraines, not cluster headaches from problem list   Hypertension    Multiple sclerosis ([place])    Neuromuscular disorder ([place])    multiple sclerosis    PAST SURGICAL HISTORY: Past Surgical History:  Procedure Laterality Date   CESAREAN SECTION  06/29/2012   Procedure: CESAREAN SECTION;  Surgeon: Allena Katz, MD;  Location: [facility];  Service: Obstetrics;  Laterality: N/A;   IUD REMOVAL  2018   MYOMECTOMY N/A 02/21/2018   Procedure: MYOMECTOMY, ABDOMINAL;  Surgeon: Molli Posey, MD;  Location: [facility];  Service: Gynecology;  Laterality: N/A;    FAMILY HISTORY: Family History  Problem Relation Age of Onset   Lung cancer Maternal Grandfather    Hypertension Mother    Hypertension Father    Mental illness Maternal Grandmother    Diabetes Maternal Grandmother    Glaucoma Paternal Grandmother    Hypertension Paternal Grandfather  SOCIAL HISTORY: Social History   Socioeconomic History   Marital status: Single    Spouse name: Not on file   Number of children: 1   Years of education: Not on file   Highest education level: Not on file  Occupational History   Not on file  Tobacco Use   Smoking status: Never   Smokeless tobacco: Never  Vaping Use   Vaping Use: Never used  Substance and Sexual Activity   Alcohol use: Yes    Alcohol/week: 0.0 - 1.0 standard drinks of alcohol    Comment: occ   Drug use: No   Sexual activity: Yes    Birth control/protection: Implant  Other Topics Concern   Not on file  Social History Narrative   Lives at home with son (born 2013)  - moving to [place]  spring 2024 to live with Father of son lives in [place]- long term relationship      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    Prior  Advertising account executive (Pharmacist, community) Garment/textile technologist.        Wears glasses.   Caffeine none.   Social Determinants of Health   Financial Resource Strain: Not on file  Food Insecurity: Not on file  Transportation Needs: Not on file  Physical Activity: Not on file  Stress: Not on file  Social Connections: Not on file  Intimate Partner Violence: Not on file      PHYSICAL EXAM  Vitals:   02/07/23 0907  BP: (!) 142/86  Pulse: 75  Weight: 220 lb 9.6 oz (100.1 kg)  Height: 5\' 5"  (1.651 m)     Body mass index is 36.71 kg/m.  Generalized: Well developed, in no acute distress  Cardiology: normal rate and rhythm, no murmur noted Respiratory: clear to auscultation bilaterally  Mallampati 3+ Neurological examination  Mentation: Alert oriented to time, place, history taking. Follows all commands speech and language fluent Cranial nerve II-XII: Pupils were equal round reactive to light. Extraocular movements were full, visual field were full on confrontational test. Facial sensation and strength were normal. Uvula tongue midline. Head turning and shoulder shrug  were normal and symmetric. Motor: The motor testing reveals 5 over 5 strength of all 4 extremities. Good symmetric motor tone is noted throughout.  Sensory: Sensory testing is intact to soft touch on all 4 extremities. No evidence of extinction is noted.  Coordination: Cerebellar testing reveals good finger-nose-finger and heel-to-shin bilaterally.  Gait and station: Gait is normal.  Reflexes: Deep tendon reflexes are symmetric and normal bilaterally.   DIAGNOSTIC DATA (LABS, IMAGING, TESTING) - I reviewed patient records, labs, notes, testing and imaging myself where available.      No data to display           Lab  Results  Component Value Date   WBC 10.2 09/06/2022   HGB 12.7 09/06/2022   HCT 36.7 09/06/2022   MCV 84 09/06/2022   PLT 347 09/06/2022      Component Value Date/Time   NA 134 (L) 01/19/2022 1153   K 3.5 01/19/2022 1153   CL 100 01/19/2022 1153   CO2 24 01/19/2022 1153   GLUCOSE 109 (H) 01/19/2022 1153   BUN 15 01/19/2022 1153   CREATININE 0.89 01/19/2022 1153   CREATININE 0.87 07/24/2020 1342   CALCIUM 9.8 01/19/2022 1153   PROT 7.9 01/19/2022 1153   ALBUMIN 4.4 01/19/2022 1153   ALBUMIN 3,800 03/31/2016  1024   AST 19 01/19/2022 1153   ALT 15 01/19/2022 1153   ALKPHOS 60 01/19/2022 1153   BILITOT 0.4 01/19/2022 1153   GFRNONAA 88 07/24/2020 1342   GFRAA 101 07/24/2020 1342   Lab Results  Component Value Date   CHOL 133 01/19/2022   HDL 49.70 01/19/2022   LDLCALC 52 01/19/2022   TRIG 156.0 (H) 01/19/2022   CHOLHDL 3 01/19/2022   No results found for: "HGBA1C" No results found for: "VITAMINB12" Lab Results  Component Value Date   TSH 1.05 07/24/2020       ASSESSMENT AND PLAN 37 y.o. year old female  has a past medical history of Allergic rhinitis due to pollen (02/06/2010), Esophageal reflux (11/29/2008), HEADACHE, CLUSTER, CHRONIC (02/06/2010), Hypertension, Multiple sclerosis ([place]), and Neuromuscular disorder ([place]). here with     ICD-10-CM   1. Relapsing remitting multiple sclerosis ([place])  G35 CBC with Differential/Platelets    Stratify JCV Ab (w/ Index) w/ Rflx    2. High risk medication use  Z79.899     3. OSA (obstructive sleep apnea)  G47.33     4. Migraine with aura and without status migrainosus, not intractable  G43.109     5. Other fatigue  R53.83     6. Urinary frequency  R35.0         [name] is doing well, today. No new or exacerbating symptoms. She will continue Tysabri infusions monthly. We will update labs. JCV negative 08/2022. MRI stable 03/2022. She will continue baclofen, topiramate and oxybutynin. Continue rizatriptan as needed for  migraines. Continue CPAP nightly for at least 4 hours. She was encouraged to focus on healthy lifestyle habits and continue close follow up with PCP. She will follow up in 6 months.    Orders Placed This Encounter  Procedures   CBC with Differential/Platelets   Stratify JCV Ab (w/ Index) w/ Rflx      No orders of the defined types were placed in this encounter.      Debbora Presto, FNP-C 02/07/2023, 9:57 AM [facility] [address] [place], [place] [address] [phone]

## 2023-02-03 NOTE — Patient Instructions (Signed)
Below is our plan: ? ?We will continue current treatment plan  ? ?Please make sure you are staying well hydrated. I recommend 50-60 ounces daily. Well balanced diet and regular exercise encouraged. Consistent sleep schedule with 6-8 hours recommended.  ? ?Please continue follow up with care team as directed.  ? ?Follow up with me in 6 months ? ?You may receive a survey regarding today's visit. I encourage you to leave honest feed back as I do use this information to improve patient care. Thank you for seeing me today!  ? ? ?

## 2023-02-07 ENCOUNTER — Encounter: Payer: Self-pay | Admitting: Family Medicine

## 2023-02-07 ENCOUNTER — Ambulatory Visit (INDEPENDENT_AMBULATORY_CARE_PROVIDER_SITE_OTHER): Payer: 59 | Admitting: Family Medicine

## 2023-02-07 VITALS — BP 142/86 | HR 75 | Ht 65.0 in | Wt 220.6 lb

## 2023-02-07 DIAGNOSIS — R5383 Other fatigue: Secondary | ICD-10-CM

## 2023-02-07 DIAGNOSIS — G43109 Migraine with aura, not intractable, without status migrainosus: Secondary | ICD-10-CM | POA: Diagnosis not present

## 2023-02-07 DIAGNOSIS — G35 Multiple sclerosis: Secondary | ICD-10-CM

## 2023-02-07 DIAGNOSIS — G4733 Obstructive sleep apnea (adult) (pediatric): Secondary | ICD-10-CM

## 2023-02-07 DIAGNOSIS — Z79899 Other long term (current) drug therapy: Secondary | ICD-10-CM

## 2023-02-07 DIAGNOSIS — R350 Frequency of micturition: Secondary | ICD-10-CM

## 2023-02-11 ENCOUNTER — Other Ambulatory Visit: Payer: Self-pay | Admitting: Neurology

## 2023-02-14 NOTE — Telephone Encounter (Signed)
Last seen on 02/07/23 per note "Oxybutynin 10mg  daily helps with urinary frequency." Follow up scheduled on 08/23/23

## 2023-02-15 ENCOUNTER — Encounter: Payer: Self-pay | Admitting: Family Medicine

## 2023-02-23 ENCOUNTER — Telehealth: Payer: Self-pay

## 2023-02-23 NOTE — Telephone Encounter (Signed)
Tried to call pt. LVM telling pt to come get her labs drawn here at the office Monday-Thursday.

## 2023-02-23 NOTE — Telephone Encounter (Signed)
-----   Message from Shawnie Dapper, NP sent at 02/23/2023  2:25 PM EDT ----- Can you guys call her about updating labs. I tried reaching her on Mychart but she ahs not read message. She can swing by to have drawn when lab is open.  ----- Message ----- From: SYSTEM Sent: 02/12/2023  12:12 AM EDT To: Shawnie Dapper, NP

## 2023-03-01 ENCOUNTER — Encounter: Payer: Self-pay | Admitting: Family Medicine

## 2023-03-01 NOTE — Telephone Encounter (Signed)
Amy - FYI

## 2023-03-07 ENCOUNTER — Telehealth: Payer: Self-pay

## 2023-03-07 ENCOUNTER — Other Ambulatory Visit: Payer: Self-pay

## 2023-03-07 DIAGNOSIS — G35 Multiple sclerosis: Secondary | ICD-10-CM

## 2023-03-07 NOTE — Telephone Encounter (Signed)
Placed JCV AB Lab in [facility] for Pick-up. Results pending.   

## 2023-03-08 LAB — CBC WITH DIFFERENTIAL/PLATELET
Basophils Absolute: 0.1 10*3/uL (ref 0.0–0.2)
Basos: 1 %
EOS (ABSOLUTE): 0.6 10*3/uL — ABNORMAL HIGH (ref 0.0–0.4)
Eos: 5 %
Hematocrit: 37.4 % (ref 34.0–46.6)
Hemoglobin: 12.5 g/dL (ref 11.1–15.9)
Immature Grans (Abs): 0.1 10*3/uL (ref 0.0–0.1)
Immature Granulocytes: 1 %
Lymphocytes Absolute: 6.7 10*3/uL — ABNORMAL HIGH (ref 0.7–3.1)
Lymphs: 51 %
MCH: 27.7 pg (ref 26.6–33.0)
MCHC: 33.4 g/dL (ref 31.5–35.7)
MCV: 83 fL (ref 79–97)
Monocytes Absolute: 1.2 10*3/uL — ABNORMAL HIGH (ref 0.1–0.9)
Monocytes: 9 %
NRBC: 1 % — ABNORMAL HIGH (ref 0–0)
Neutrophils Absolute: 4.3 10*3/uL (ref 1.4–7.0)
Neutrophils: 33 %
Platelets: 299 10*3/uL (ref 150–450)
RBC: 4.52 x10E6/uL (ref 3.77–5.28)
RDW: 14.7 % (ref 11.7–15.4)
WBC: 12.9 10*3/uL — ABNORMAL HIGH (ref 3.4–10.8)

## 2023-03-09 ENCOUNTER — Encounter: Payer: Self-pay | Admitting: Family Medicine

## 2023-03-15 ENCOUNTER — Other Ambulatory Visit: Payer: Self-pay | Admitting: Family Medicine

## 2023-03-29 ENCOUNTER — Ambulatory Visit: Payer: 59 | Admitting: Family Medicine

## 2023-06-30 ENCOUNTER — Encounter (INDEPENDENT_AMBULATORY_CARE_PROVIDER_SITE_OTHER): Payer: Self-pay

## 2023-07-14 ENCOUNTER — Other Ambulatory Visit: Payer: Self-pay | Admitting: Neurology

## 2023-07-15 NOTE — Telephone Encounter (Signed)
Last seen on 02/07/23 Follow up scheduled on 08/23/23

## 2023-07-22 ENCOUNTER — Encounter: Payer: Self-pay | Admitting: Pharmacist

## 2023-08-01 ENCOUNTER — Encounter: Payer: 59 | Admitting: Family Medicine

## 2023-08-18 NOTE — Progress Notes (Signed)
PATIENT: [name] DOB: Apr 13, 1986  REASON FOR VISIT: follow up HISTORY FROM: patient  Chief Complaint  Patient presents with   Follow-up    Pt in room 1 alone, here for MS follow up on Tysabri. Pt said right leg has been cramping, x 1 month, last for about 10-15 minutes from calf up to upper thigh. Pt said cramps comes and goes. Pt blood pressure elevated x 2.    HISTORY OF PRESENT ILLNESS:  08/23/23 ALL:  [name] is a 37 y.o. female here today for follow up for RRMS. She continues Tysabri infusions. MRI brain and cervical spine stable in 03/2022. Labs have been unremarkable, JCV from 01/2023 0.23, negative.   She is doing fairly well, today. She denies changes in gait. She continues to work out at the gym 4-5 days a week. She does cardio and HIT workouts. She usually does not need to take baclofen every day but has recently taken every night. Muscle cramps seem to be a little more frequent over the past couple weeks. No weakness, numbness or tingling. She is drinking 160-200 ounces of water daily. She takes a MVI. BP is usually normal at home. Elevated in the office, today. She is now taking triamterene-hydrochlorothiazide and amlodipine.   Mood seems fairly stable. She continues Buspar 5mg  TID per PCP. She is sleeping well. She continues to work full time for [employer].   Headaches have settled down a bit. She continues topiramate 50mg  BID. Rizatriptan works well, usually only needs 1-2 per month.   Oxybutynin 10mg  daily helps with urinary frequency. No incontinence. No vision changes. Last eye exam in 2023 was unremarkable.   She is doing well on CPAP therapy. Compliance has improved. She does feel it may be helping with headaches and fatigue.      History (copied from Dr Bonnita Hollow previous note):  [name] is a 37 y.o. woman with relapsing remitting MS.        Update 09/22/2022: She feels her MS is stable and she denies any exacerbations or new symptoms.     She is on Tysabri and tolerates it well.  Her last MRI (brain and cervical spine) was 02/20/2020.  There were no new lesions.  JCV Ab was negative 09/23/2021 (0.27)       Gait is doing well.   Se has no falls.   She can walk 2 miles straight.    She can go up and down stairs well. And only uses a bannister sometimes.     She does daily exercises.    Strength is stable and the right side is slightly weaker than her left (noted with working out).  Muscle cramps are better.  She denies tingling or numbness.    Her urinary urgency is helped by oxybutynin.    She has no new vision changes.     She has some fatigue  more than last year.  She is sleeping well most night (6-8 hours) but does wake up 1-2 times nightly   She takes naps.  She snores.   She had a sleep study in 2021 showing mild OSA=12.2.  A dental appliance was recmmended but insurance did not cover..   Mood is doing ok.   She is back n buspirone for anxiety.   It has helped.      Migraines occur 1/7 days a week down form 4/7 since starting TPM.   They are helped by [facility]      [facility]  SLEEPINESS SCALE   On a scale of 0 - 3 what is the chance of dozing:   Sitting and Reading:                           3 Watching TV:                                                  3 Sitting inactive in a public place:        0 Passenger in car for one hour:           3 Lying down to rest in the afternoon:   3 Sitting and talking to someone:          0 Sitting quietly after lunch:                   2 In a car, stopped in traffic:                  0   Total (out of 24):   14/24 (mild to moderate EDS)     MS History:    In May, 2017,  she had the onset of right arm numbness and weakness that developed over one day.    She went to the emergency room and an MRI of the brain on 03/28/2016 showed multiple T2/FLAIR hyperintense foci consistent with multiple sclerosis. She was admitted and received 5 days of IV steroids. While in the hospital she had a  contrasted MRI of the brain that showed that 2 of those lesions enhanced consistent with more acute onset. Additionally, she had 2 lesions in the cervical spine, both enhancing. The one at C6 was towards the right and could explain her symptoms. Another one was adjacent to C3-C4, posteriorly. She also had a lumbar puncture. The cerebrospinal fluid shows that there were 9 oligoclonal bands in the CSF which is also consistent with multiple sclerosis. Due to the aggressiveness of her MS, she was started on Tysabri. She is JCV antibody negative (0.25).   In retrospect, in 2011, she had headaches and tremors and had an MRI of the brain. It showed a single T2/FLAIR hyperintense focus in the right periventricular white matter of the parietal lobe.   Follow-up MRI in 2012 did not show any new lesions. The symptoms improved and she had no other symptoms until 2017   Imaging: MRI of the brain 03/16/2022 showed Multiple T2/FLAIR hyperintense foci in the hemispheres in a pattern consistent with chronic demyelinating plaque associated with multiple sclerosis.  None of the foci enhanced.  They do not appear to be acute.  Compared to the MRI from 03/11/2020, there were no new lesions.    MRI of the cervical spine 03/16/2022 Two small foci within the spinal cord adjacent to C3-C4 and C6 consistent with chronic demyelinating plaque associated with multiple sclerosis.   2.   Minimal disc degenerative changes at C3-C4 and C4-C5 not leading to spinal stenosis or nerve root compression MRI of the cervical spine 03/11/2020 shows T2 hyperintense foci within the spinal cord posteriorly adjacent to C3-C4 and posterolaterally to the right adjacent to C6.  Minimal disc degenerative changes at C3-C4.   MRI of the brain 03/11/2020 shows Multiple T2/FLAIR hyperintense foci in the hemispheres in a pattern and configuration consistent  with chronic demyelinating plaque associated with multiple sclerosis.  None of the foci appear to be acute and  they were all present on the 08/29/2018 MRI.   MRI of the brain 08/29/2018 shows T2/FLAIR hyperintense foci in the hemispheres and left pons in a pattern and configuration consistent with chronic demyelinating plaque associated with multiple sclerosis.  None of the foci appear to be acute.  When compared to the MRI from May 2017, there are no new lesions.  Some of the foci that were acute with enhancement in 2017 are smaller on the current study and no longer enhance.   MRI of the thoracic spine 03/29/2016 was normal  REVIEW OF SYSTEMS: Out of a complete 14 system review of symptoms, the patient complains only of the following symptoms, muscle cramps, fatigue, snoring, anxiety, and all other reviewed systems are negative.  ESS: 5/24. Previously 14/24  ALLERGIES: Allergies  Allergen Reactions   Amoxicillin Rash    HOME MEDICATIONS: Outpatient Medications Prior to Visit  Medication Sig Dispense Refill   amLODipine (NORVASC) 2.5 MG tablet TAKE 1 TABLET BY MOUTH DAILY 90 tablet 3   baclofen (LIORESAL) 10 MG tablet Take 1 tablet (10 mg total) by mouth 3 (three) times daily. 270 tablet 1   busPIRone (BUSPAR) 5 MG tablet TAKE 1 TABLET BY MOUTH 3 TIMES DAILY AS NEEDED. 60 tablet 2   cetirizine (ZYRTEC) 10 MG tablet TAKE 1 TABLET BY MOUTH EVERY DAY AS NEEDED FOR ALLERGY 90 tablet 1   fluticasone (FLONASE) 50 MCG/ACT nasal spray TWO SPRAYS EACH NOSTRIL ONCE A DAY 1 g 0   ibuprofen (ADVIL,MOTRIN) 800 MG tablet Take 1 tablet (800 mg total) by mouth every 8 (eight) hours as needed (mild pain). 30 tablet 0   natalizumab (TYSABRI) 300 MG/15ML injection Inject 300 mg into the vein every 30 (thirty) days.     oxybutynin (DITROPAN-XL) 10 MG 24 hr tablet TAKE 1 TABLET BY MOUTH DAILY AT  BEDTIME 90 tablet 1   rizatriptan (MAXALT-MLT) 10 MG disintegrating tablet TAKE 1 TABLET BY MOUTH AS NEEDED FOR MIGRAINE. MAY REPEAT IN 2 HOURS IF NEEDED 9 tablet 3   topiramate (TOPAMAX) 50 MG tablet TAKE 1 TABLET BY MOUTH  TWICE  DAILY 180 tablet 0   triamterene-hydrochlorothiazide (MAXZIDE-25) 37.5-25 MG tablet TAKE 1 TABLET BY MOUTH  DAILY 90 tablet 3   ALPRAZolam (XANAX) 0.5 MG tablet Take 2 tablets 30 min. prior to MRI.  May take one more if needed immediately before MRI.  You must not drink alcohol, drive, or operate dangerous equipment after taking this medication. 3 tablet 0   No facility-administered medications prior to visit.    PAST MEDICAL HISTORY: Past Medical History:  Diagnosis Date   Allergic rhinitis due to pollen 02/06/2010   Esophageal reflux 11/29/2008   HEADACHE, CLUSTER, CHRONIC 02/06/2010   patient reports migraines, not cluster headaches from problem list   Hypertension    Multiple sclerosis (HCC)    Neuromuscular disorder (HCC)    multiple sclerosis    PAST SURGICAL HISTORY: Past Surgical History:  Procedure Laterality Date   CESAREAN SECTION  06/29/2012   Procedure: CESAREAN SECTION;  Surgeon: Leslie Andrea, MD;  Location: [facility];  Service: Obstetrics;  Laterality: N/A;   IUD REMOVAL  2018   MYOMECTOMY N/A 02/21/2018   Procedure: MYOMECTOMY, ABDOMINAL;  Surgeon: Richarda Overlie, MD;  Location: [facility];  Service: Gynecology;  Laterality: N/A;    FAMILY HISTORY: Family History  Problem Relation Age of Onset  Lung cancer Maternal Grandfather    Hypertension Mother    Hypertension Father    Mental illness Maternal Grandmother    Diabetes Maternal Grandmother    Glaucoma Paternal Grandmother    Hypertension Paternal Grandfather     SOCIAL HISTORY: Social History   Socioeconomic History   Marital status: Single    Spouse name: Not on file   Number of children: 1   Years of education: Not on file   Highest education level: Not on file  Occupational History   Not on file  Tobacco Use   Smoking status: Never   Smokeless tobacco: Never  Vaping Use   Vaping status: Never Used  Substance and Sexual Activity   Alcohol use: Yes    Alcohol/week: 0.0 - 1.0 standard  drinks of alcohol    Comment: occ   Drug use: No   Sexual activity: Yes    Birth control/protection: Implant  Other Topics Concern   Not on file  Social History Narrative   Lives at home with son (born 2013)  - moving to [place] spring 2024 to live with Father of son lives in [place]- long term relationship      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    Prior  Forensic psychologist (Research officer, political party) Scientist, physiological.        Wears glasses.   Caffeine none.   Social Determinants of Health   Financial Resource Strain: Not on file  Food Insecurity: Not on file  Transportation Needs: Not on file  Physical Activity: Not on file  Stress: Not on file  Social Connections: Not on file  Intimate Partner Violence: Not on file    PHYSICAL EXAM  Vitals:   08/23/23 0843 08/23/23 0846  BP: (!) 145/82 (!) 147/75  Pulse: 70 70  Weight: 217 lb (98.4 kg)   Height: 5\' 5"  (1.651 m)     Body mass index is 36.11 kg/m.  Generalized: Well developed, in no acute distress  Cardiology: normal rate and rhythm, no murmur noted Respiratory: clear to auscultation bilaterally  Mallampati 3+ Neurological examination  Mentation: Alert oriented to time, place, history taking. Follows all commands speech and language fluent Cranial nerve II-XII: Pupils were equal round reactive to light. Extraocular movements were full, visual field were full on confrontational test. Facial sensation and strength were normal. Uvula tongue midline. Head turning and shoulder shrug  were normal and symmetric. Motor: The motor testing reveals 5 over 5 strength of all 4 extremities. Good symmetric motor tone is noted throughout.  Sensory: Sensory testing is intact to soft touch on all 4 extremities. No evidence of extinction is noted.  Coordination: Cerebellar testing reveals good finger-nose-finger and heel-to-shin bilaterally.  Gait and station: Gait is  normal.  Reflexes: Deep tendon reflexes are symmetric and normal bilaterally.    DIAGNOSTIC DATA (LABS, IMAGING, TESTING) - I reviewed patient records, labs, notes, testing and imaging myself where available.      No data to display           Lab Results  Component Value Date   WBC 12.9 (H) 03/07/2023   HGB 12.5 03/07/2023   HCT 37.4 03/07/2023   MCV 83 03/07/2023   PLT 299 03/07/2023      Component Value Date/Time   NA 134 (L) 01/19/2022 1153   K 3.5 01/19/2022 1153   CL 100 01/19/2022 1153   CO2 24 01/19/2022 1153  GLUCOSE 109 (H) 01/19/2022 1153   BUN 15 01/19/2022 1153   CREATININE 0.89 01/19/2022 1153   CREATININE 0.87 07/24/2020 1342   CALCIUM 9.8 01/19/2022 1153   PROT 7.9 01/19/2022 1153   ALBUMIN 4.4 01/19/2022 1153   ALBUMIN 3,800 03/31/2016 1024   AST 19 01/19/2022 1153   ALT 15 01/19/2022 1153   ALKPHOS 60 01/19/2022 1153   BILITOT 0.4 01/19/2022 1153   GFRNONAA 88 07/24/2020 1342   GFRAA 101 07/24/2020 1342   Lab Results  Component Value Date   CHOL 133 01/19/2022   HDL 49.70 01/19/2022   LDLCALC 52 01/19/2022   TRIG 156.0 (H) 01/19/2022   CHOLHDL 3 01/19/2022   No results found for: "HGBA1C" No results found for: "VITAMINB12" Lab Results  Component Value Date   TSH 1.05 07/24/2020       ASSESSMENT AND PLAN 37 y.o. year old female  has a past medical history of Allergic rhinitis due to pollen (02/06/2010), Esophageal reflux (11/29/2008), HEADACHE, CLUSTER, CHRONIC (02/06/2010), Hypertension, Multiple sclerosis (HCC), and Neuromuscular disorder (HCC). here with     ICD-10-CM   1. Relapsing remitting multiple sclerosis (HCC)  G35 CBC with Differential/Platelets    Stratify JCV Ab (w/ Index) w/ Rflx    CMP    2. High risk medication use  Z79.899     3. OSA (obstructive sleep apnea)  G47.33     4. Migraine with aura and without status migrainosus, not intractable  G43.109     5. Other fatigue  R53.83     6. Urinary frequency  R35.0      7. Muscle cramps  R25.2 CMP    Magnesium       Terrin is doing well, today. No new or exacerbating symptoms. She will continue Tysabri infusions monthly. We will update labs. JCV negative 01/2023. MRI stable 03/2022. Could consider updating but she declines at this time. She will continue baclofen, topiramate and oxybutynin. We discussed increasing baclofen dose if muscle cramps do not improve. Continue rizatriptan as needed for migraines. Continue CPAP nightly for at least 4 hours. She will keep a close eye on her BP. I have asked her to monitor water intake and aim for about 100 ounces daily. She was encouraged to focus on healthy lifestyle habits and continue close follow up with PCP. She will follow up in 6 months.    Orders Placed This Encounter  Procedures   CBC with Differential/Platelets   Stratify JCV Ab (w/ Index) w/ Rflx   CMP   Magnesium      No orders of the defined types were placed in this encounter.      Shawnie Dapper, FNP-C 08/23/2023, 9:27 AM [facility] [address] [place], [place] [address] [phone]

## 2023-08-18 NOTE — Patient Instructions (Addendum)
Below is our plan:  We will continue current treatment plan. Keep a close eye on your blood pressure. Try adding 1/2 tablet of baclofen during the day to see if this helps with cramps. I will update blood work. Aim for about 100 ounces of water daily.   Please make sure you are staying well hydrated. I recommend 50-60 ounces daily. Well balanced diet and regular exercise encouraged. Consistent sleep schedule with 6-8 hours recommended.   Please continue follow up with care team as directed.   Follow up with Dr Epimenio Foot in 6 months   You may receive a survey regarding today's visit. I encourage you to leave honest feed back as I do use this information to improve patient care. Thank you for seeing me today!

## 2023-08-23 ENCOUNTER — Ambulatory Visit (INDEPENDENT_AMBULATORY_CARE_PROVIDER_SITE_OTHER): Payer: 59 | Admitting: Family Medicine

## 2023-08-23 ENCOUNTER — Encounter: Payer: Self-pay | Admitting: Family Medicine

## 2023-08-23 ENCOUNTER — Telehealth: Payer: Self-pay

## 2023-08-23 VITALS — BP 147/75 | HR 70 | Ht 65.0 in | Wt 217.0 lb

## 2023-08-23 DIAGNOSIS — G4733 Obstructive sleep apnea (adult) (pediatric): Secondary | ICD-10-CM | POA: Diagnosis not present

## 2023-08-23 DIAGNOSIS — G43109 Migraine with aura, not intractable, without status migrainosus: Secondary | ICD-10-CM

## 2023-08-23 DIAGNOSIS — R5383 Other fatigue: Secondary | ICD-10-CM

## 2023-08-23 DIAGNOSIS — G35 Multiple sclerosis: Secondary | ICD-10-CM

## 2023-08-23 DIAGNOSIS — R252 Cramp and spasm: Secondary | ICD-10-CM

## 2023-08-23 DIAGNOSIS — Z79899 Other long term (current) drug therapy: Secondary | ICD-10-CM

## 2023-08-23 DIAGNOSIS — R350 Frequency of micturition: Secondary | ICD-10-CM

## 2023-08-23 NOTE — Telephone Encounter (Signed)
Placed JCV in [facility] box 08/23/2023

## 2023-08-24 LAB — CBC WITH DIFFERENTIAL/PLATELET
Basophils Absolute: 0.1 10*3/uL (ref 0.0–0.2)
Basos: 1 %
EOS (ABSOLUTE): 0.2 10*3/uL (ref 0.0–0.4)
Eos: 3 %
Hematocrit: 38.7 % (ref 34.0–46.6)
Hemoglobin: 12.6 g/dL (ref 11.1–15.9)
Immature Grans (Abs): 0 10*3/uL (ref 0.0–0.1)
Immature Granulocytes: 0 %
Lymphocytes Absolute: 6.9 10*3/uL — ABNORMAL HIGH (ref 0.7–3.1)
Lymphs: 70 %
MCH: 27.9 pg (ref 26.6–33.0)
MCHC: 32.6 g/dL (ref 31.5–35.7)
MCV: 86 fL (ref 79–97)
Monocytes Absolute: 0.9 10*3/uL (ref 0.1–0.9)
Monocytes: 9 %
Neutrophils Absolute: 1.6 10*3/uL (ref 1.4–7.0)
Neutrophils: 17 %
Platelets: 271 10*3/uL (ref 150–450)
RBC: 4.51 x10E6/uL (ref 3.77–5.28)
RDW: 15.2 % (ref 11.7–15.4)
WBC: 9.7 10*3/uL (ref 3.4–10.8)

## 2023-08-24 LAB — COMPREHENSIVE METABOLIC PANEL
ALT: 23 [IU]/L (ref 0–32)
AST: 19 [IU]/L (ref 0–40)
Albumin: 4.5 g/dL (ref 3.9–4.9)
Alkaline Phosphatase: 70 [IU]/L (ref 44–121)
BUN/Creatinine Ratio: 9 (ref 9–23)
BUN: 9 mg/dL (ref 6–20)
Bilirubin Total: 0.4 mg/dL (ref 0.0–1.2)
CO2: 21 mmol/L (ref 20–29)
Calcium: 9.5 mg/dL (ref 8.7–10.2)
Chloride: 102 mmol/L (ref 96–106)
Creatinine, Ser: 1 mg/dL (ref 0.57–1.00)
Globulin, Total: 3.4 g/dL (ref 1.5–4.5)
Glucose: 102 mg/dL — ABNORMAL HIGH (ref 70–99)
Potassium: 4.2 mmol/L (ref 3.5–5.2)
Sodium: 136 mmol/L (ref 134–144)
Total Protein: 7.9 g/dL (ref 6.0–8.5)
eGFR: 75 mL/min/{1.73_m2} (ref >59)

## 2023-08-24 LAB — MAGNESIUM: Magnesium: 2 mg/dL (ref 1.6–2.3)

## 2023-08-26 ENCOUNTER — Encounter: Payer: Self-pay | Admitting: Family Medicine

## 2023-08-29 ENCOUNTER — Other Ambulatory Visit: Payer: Self-pay

## 2023-08-29 MED ORDER — TRIAMTERENE-HCTZ 37.5-25 MG PO TABS: 1.0000 | ORAL_TABLET | Freq: Every day | ORAL | 3 refills | Status: DC

## 2023-08-31 LAB — STRATIFY JCV AB (W/ INDEX) W/ RFLX
Index Value: 0.21 — ABNORMAL HIGH
Stratify JCV (TM) Ab w/Reflex Inhibition: UNDETERMINED — AB

## 2023-08-31 LAB — RFLX STRATIFY JCV (TM) AB INHIBITION: JCV Antibody by Inhibition: NEGATIVE

## 2023-09-09 ENCOUNTER — Other Ambulatory Visit: Payer: Self-pay | Admitting: Neurology

## 2023-09-12 NOTE — Telephone Encounter (Signed)
Last seen on 108/24 Follow up scheduled on 03/06/24 Refill not due to end of November

## 2023-09-23 ENCOUNTER — Other Ambulatory Visit: Payer: Self-pay | Admitting: Neurology

## 2023-09-26 ENCOUNTER — Other Ambulatory Visit: Payer: Self-pay

## 2023-10-25 ENCOUNTER — Encounter: Payer: Self-pay | Admitting: Internal Medicine

## 2023-10-25 ENCOUNTER — Ambulatory Visit: Payer: 59 | Admitting: Internal Medicine

## 2023-10-25 VITALS — BP 167/105 | HR 72 | Temp 98.1°F | Ht 65.0 in | Wt 219.8 lb

## 2023-10-25 DIAGNOSIS — J020 Streptococcal pharyngitis: Secondary | ICD-10-CM | POA: Diagnosis not present

## 2023-10-25 DIAGNOSIS — J988 Other specified respiratory disorders: Secondary | ICD-10-CM | POA: Diagnosis not present

## 2023-10-25 DIAGNOSIS — G35 Multiple sclerosis: Secondary | ICD-10-CM

## 2023-10-25 DIAGNOSIS — R030 Elevated blood-pressure reading, without diagnosis of hypertension: Secondary | ICD-10-CM

## 2023-10-25 DIAGNOSIS — J029 Acute pharyngitis, unspecified: Secondary | ICD-10-CM

## 2023-10-25 LAB — POCT RAPID STREP A (OFFICE): Rapid Strep A Screen: POSITIVE — AB

## 2023-10-25 MED ORDER — BENZONATATE 200 MG PO CAPS: 200.0000 mg | ORAL_CAPSULE | Freq: Three times a day (TID) | ORAL | 1 refills | Status: DC | PRN

## 2023-10-25 MED ORDER — DOXYCYCLINE HYCLATE 100 MG PO TABS: 100.0000 mg | ORAL_TABLET | Freq: Two times a day (BID) | ORAL | 0 refills | Status: DC

## 2023-10-25 NOTE — Progress Notes (Signed)
[facility]: [phone]   -- Medical Office Visit --  Patient:  [name]      Age: 37 y.o.       Sex:  female  Date:   10/25/2023 Today's Healthcare Provider: Lula Olszewski, MD  ==========================================================================    Assessment & Plan Respiratory infection Lower Respiratory Tract Infection/Walking Pneumonia (ICD-10: J18.9) suspected  Assessment: Patient presents with 2-week history of respiratory symptoms including cough and nasal congestion. Physical exam reveals bilateral basilar crackles. Given patient's immunocompromised status due to MS and natalizumab therapy, bacterial pneumonia including atypical organisms must be considered, though viral etiology remains possible. Clinical presentation and exam findings warrant empiric antibiotic therapy despite diagnostic uncertainty. Plan:  Doxycycline 100mg  PO BID x 7 days - covers typical and atypical organisms including potential Legionella Continue current MS medications including natalizumab Symptomatic treatment with benzonatate 200mg  PO TID PRN cough Consider OTC Robitussin for additional cough relief Work from home during acute illness if possible Wear mask if must go to workplace Return to clinic in 1 week if not improving Seek immediate care if developing severe symptoms including: Worsening shortness of breath High fever Chest pain Inability to maintain oral intake   Upper Respiratory Tract Infection/Sinusitis (ICD-10: J01.90) seems to be initial source Assessment: Concurrent upper respiratory symptoms including nasal congestion and sinus pressure headache for >7 days. While viral etiology possible, duration of symptoms and immunocompromised status support empiric antibiotic coverage. Plan:  Covered by doxycycline prescribed above Continue nasal steroid spray as prescribed Saline nasal irrigation may provide symptom relief Monitor  for worsening of symptoms Sore throat  Acute streptococcal pharyngitis Very faintly positive test, doubt significance, doxycycline should cover Relapsing remitting multiple sclerosis (HCC) Multiple Sclerosis (ICD-10: G35) Assessment: Stable on monthly natalizumab. Current infection may be more severe/prolonged due to immunosuppression. Plan:  Continue all MS medications including natalizumab Monitor for any exacerbation of MS symptoms during infection Maintain close communication with MS specialist if condition worsens  Preventive Care:  Advised regarding importance of infection prevention measures given immunocompromised status Emphasized need for reliable contraception while on doxycycline Recommended mask use in public during acute illness  Follow-up:  Return to clinic in 1 week if symptoms not improving Seek emergency care if developing severe symptoms Continue routine MS care as scheduled Elevated blood pressure reading Likely secondary to infection(s) will share to Primary Care Provider (PCP)      Orders Placed During this Encounter:   ED Discharge Orders          Ordered    POCT rapid strep A        10/25/23 1019    doxycycline (VIBRA-TABS) 100 MG tablet  2 times daily        10/25/23 1038    benzonatate (TESSALON) 200 MG capsule  3 times daily PRN        10/25/23 1038          Diagnoses and all orders for this visit: Sore throat -     POCT rapid strep A Acute streptococcal pharyngitis -     doxycycline (VIBRA-TABS) 100 MG tablet; Take 1 tablet (100 mg total) by mouth 2 (two) times daily. Take with full glass of water. Respiratory infection -     doxycycline (VIBRA-TABS) 100 MG tablet; Take 1 tablet (100 mg total) by mouth 2 (two) times daily. Take with full glass of water. -     benzonatate (TESSALON) 200 MG capsule; Take 1 capsule (200 mg  total) by mouth 3 (three) times daily as needed.  Recommended follow-up: No follow-ups on file. Future Appointments   Date Time Provider Department Center  02/07/2024  2:00 PM Shelva Majestic, MD [facility]  03/06/2024 10:00 AM Sater, Pearletha Furl, MD [facility] None  Patient Care Team: Shelva Majestic, MD as PCP - General (Family Medicine) Richarda Overlie, MD as Consulting Physician (Obstetrics and Gynecology)    SUBJECTIVE: 37 y.o. female who has Allergic rhinitis due to pollen; Migraine; Relapsing remitting multiple sclerosis (HCC); High risk medication use; Urinary frequency; Essential hypertension; Leiomyoma; Anxiety; Immunosuppressed status (HCC); OSA (obstructive sleep apnea); and OAB (overactive bladder) on their problem list.  Main reasons for visit/main concerns/chief complaint: Possible sinus infection, Negative COVID test (At home on 12/5.), Nasal Congestion, and Sore Throat (Symptoms since 11/27,  took OTC with some temporary relief. No fever or body aches.)    AI-Extracted: Discussed the use of AI scribe software for clinical note transcription with the patient, who gave verbal consent to proceed. HISTORY OF PRESENT ILLNESS:  The patient is a 37 year old female with multiple sclerosis on natalizumab therapy who presents with a respiratory illness that began on October 12, 2023. Initial symptoms included nasal congestion and significant sinus pressure headache. Over the subsequent two weeks, symptoms progressed to include persistent cough and ongoing nasal congestion despite self-treatment with Tylenol Sinus Severe. Maximum recorded temperature during this period was 100.35F. Patient reports mild body aches and minimal shortness of breath without chest pain.  Prior to this visit, patient consulted with [facility] who prescribed a steroidal nasal spray, which provided only temporary symptomatic relief. Patient completed home COVID and influenza testing on December 5, with both tests reported as negative. Patient notes that due to her MS and immunosuppressive therapy, she typically experiences more  prolonged and severe courses with respiratory infections.  Current symptoms include persistent cough, nasal congestion, and sinus pressure headache. Patient describes mild dyspnea with no chest pain. Patient reports ability to work from home during illness. Patient denies recent fevers, severe body aches, or other systemic symptoms suggestive of influenza-like illness.  Of note, patient receives monthly natalizumab infusions for MS management and takes multiple other medications including antihypertensives and medications for migraine prophylaxis. No recent changes to baseline medication regimen.  Physical exam reveals normal oropharynx without tonsillar exudate or erythema. Neck is warm to touch without lymphadenopathy. Lung exam demonstrates faint basilar crackles bilaterally. Rapid strep testing shows faint positive result, though clinical presentation is not typical for streptococcal pharyngitis.   Note that patient  has a past medical history of Allergic rhinitis due to pollen (02/06/2010), Esophageal reflux (11/29/2008), HEADACHE, CLUSTER, CHRONIC (02/06/2010), Hypertension, Multiple sclerosis (HCC), and Neuromuscular disorder (HCC).  Problem list overviews that were updated at today's visit:No problems updated.  Med reconciliation: Current Outpatient Medications on File Prior to Visit  Medication Sig   amLODipine (NORVASC) 2.5 MG tablet TAKE 1 TABLET BY MOUTH DAILY   Azelastine-Fluticasone 137-50 MCG/ACT SUSP SMARTSIG:1 Spray(s) Both Nares Twice Daily PRN   baclofen (LIORESAL) 10 MG tablet Take 1 tablet (10 mg total) by mouth 3 (three) times daily.   busPIRone (BUSPAR) 5 MG tablet TAKE 1 TABLET BY MOUTH 3 TIMES DAILY AS NEEDED.   cetirizine (ZYRTEC) 10 MG tablet TAKE 1 TABLET BY MOUTH EVERY DAY AS NEEDED FOR ALLERGY   fluticasone (FLONASE) 50 MCG/ACT nasal spray TWO SPRAYS EACH NOSTRIL ONCE A DAY   ibuprofen (ADVIL,MOTRIN) 800 MG tablet Take 1 tablet (800 mg total) by mouth every  8 (eight)  hours as needed (mild pain).   natalizumab (TYSABRI) 300 MG/15ML injection Inject 300 mg into the vein every 30 (thirty) days.   oxybutynin (DITROPAN-XL) 10 MG 24 hr tablet TAKE 1 TABLET BY MOUTH DAILY AT  BEDTIME   rizatriptan (MAXALT-MLT) 10 MG disintegrating tablet TAKE 1 TABLET BY MOUTH AS NEEDED FOR MIGRAINE. MAY REPEAT IN 2 HOURS IF NEEDED   topiramate (TOPAMAX) 50 MG tablet TAKE 1 TABLET BY MOUTH TWICE  DAILY   triamterene-hydrochlorothiazide (MAXZIDE-25) 37.5-25 MG tablet Take 1 tablet by mouth daily.   No current facility-administered medications on file prior to visit.   Medications Discontinued During This Encounter  Medication Reason   benzonatate (TESSALON) 200 MG capsule Reorder     Objective   Physical Exam     10/25/2023   10:15 AM 10/25/2023   10:08 AM 08/23/2023    8:46 AM  Vitals with BMI  Height  5\' 5"    Weight  219 lbs 13 oz   BMI  36.58   Systolic 167 165 161  Diastolic 105 115 75  Pulse  72 70   Wt Readings from Last 10 Encounters:  10/25/23 219 lb 12.8 oz (99.7 kg)  08/23/23 217 lb (98.4 kg)  02/07/23 220 lb 9.6 oz (100.1 kg)  01/25/23 220 lb 6.4 oz (100 kg)  10/20/22 221 lb 3.2 oz (100.3 kg)  09/22/22 227 lb 8 oz (103.2 kg)  07/27/22 222 lb 6.4 oz (100.9 kg)  02/25/22 216 lb 8 oz (98.2 kg)  01/19/22 216 lb 3.2 oz (98.1 kg)  12/09/21 217 lb 2 oz (98.5 kg)   Vital signs reviewed.  Nursing notes reviewed. Weight trend reviewed. Abnormalities and Problem-Specific physical exam findings:  no LAD, tonsils normal sized, crackles at lung bases, normal cardiovascular sounds. High blood pressure noted.  General Appearance:  No acute distress appreciable.   Well-groomed, healthy-appearing female.  Well proportioned with no abnormal fat distribution.  Good muscle tone. Pulmonary:  Normal work of breathing at rest, no respiratory distress apparent. SpO2: 98 %  Musculoskeletal: All extremities are intact.  Neurological:  Awake, alert, oriented, and engaged.  No  obvious focal neurological deficits or cognitive impairments.  Sensorium seems unclouded.   Speech is clear and coherent with logical content. Psychiatric:  Appropriate mood, pleasant and cooperative demeanor, thoughtful and engaged during the exam  Results            No results found for any visits on 10/25/23.  Office Visit on 08/23/2023  Component Date Value   WBC 08/23/2023 9.7    RBC 08/23/2023 4.51    Hemoglobin 08/23/2023 12.6    Hematocrit 08/23/2023 38.7    MCV 08/23/2023 86    MCH 08/23/2023 27.9    MCHC 08/23/2023 32.6    RDW 08/23/2023 15.2    Platelets 08/23/2023 271    Neutrophils 08/23/2023 17    Lymphs 08/23/2023 70    Monocytes 08/23/2023 9    Eos 08/23/2023 3    Basos 08/23/2023 1    Neutrophils Absolute 08/23/2023 1.6    Lymphocytes Absolute 08/23/2023 6.9 (H)    Monocytes Absolute 08/23/2023 0.9    EOS (ABSOLUTE) 08/23/2023 0.2    Basophils Absolute 08/23/2023 0.1    Immature Granulocytes 08/23/2023 0    Immature Grans (Abs) 08/23/2023 0.0    Index Value 08/23/2023 0.21 (H)    Stratify JCV (TM) Ab w/R* 08/23/2023 INDETERMINATE (A)    Glucose 08/23/2023 102 (H)    BUN 08/23/2023 9  Creatinine, Ser 08/23/2023 1.00    eGFR 08/23/2023 75    BUN/Creatinine Ratio 08/23/2023 9    Sodium 08/23/2023 136    Potassium 08/23/2023 4.2    Chloride 08/23/2023 102    CO2 08/23/2023 21    Calcium 08/23/2023 9.5    Total Protein 08/23/2023 7.9    Albumin 08/23/2023 4.5    Globulin, Total 08/23/2023 3.4    Bilirubin Total 08/23/2023 0.4    Alkaline Phosphatase 08/23/2023 70    AST 08/23/2023 19    ALT 08/23/2023 23    Magnesium 08/23/2023 2.0    JCV Antibody by Inhibiti* 08/23/2023 FINAL RSLT: NEGATIVE   Office Visit on 02/07/2023  Component Date Value   WBC 03/07/2023 12.9 (H)    RBC 03/07/2023 4.52    Hemoglobin 03/07/2023 12.5    Hematocrit 03/07/2023 37.4    MCV 03/07/2023 83    MCH 03/07/2023 27.7    MCHC 03/07/2023 33.4    RDW 03/07/2023 14.7     Platelets 03/07/2023 299    Neutrophils 03/07/2023 33    Lymphs 03/07/2023 51    Monocytes 03/07/2023 9    Eos 03/07/2023 5    Basos 03/07/2023 1    Neutrophils Absolute 03/07/2023 4.3    Lymphocytes Absolute 03/07/2023 6.7 (H)    Monocytes Absolute 03/07/2023 1.2 (H)    EOS (ABSOLUTE) 03/07/2023 0.6 (H)    Basophils Absolute 03/07/2023 0.1    Immature Granulocytes 03/07/2023 1    Immature Grans (Abs) 03/07/2023 0.1    NRBC 03/07/2023 1 (H)    No image results found.   No results found.  DG Chest 2 View  Result Date: 10/20/2022 CLINICAL DATA:  Cough, shortness of breath. EXAM: CHEST - 2 VIEW COMPARISON:  December 03, 2008. FINDINGS: The heart size and mediastinal contours are within normal limits. Both lungs are clear. The visualized skeletal structures are unremarkable. IMPRESSION: No active cardiopulmonary disease. Electronically Signed   By: Lupita Raider M.D.   On: 10/20/2022 14:51         Additional Info: This encounter employed real-time, collaborative documentation. The patient actively reviewed and updated their medical record on a shared screen, ensuring transparency and facilitating joint problem-solving for the problem list, overview, and plan. This approach promotes accurate, informed care. The treatment plan was discussed and reviewed in detail, including medication safety, potential side effects, and all patient questions. We confirmed understanding and comfort with the plan. Follow-up instructions were established, including contacting the office for any concerns, returning if symptoms worsen, persist, or new symptoms develop, and precautions for potential emergency department visits.

## 2023-10-25 NOTE — Patient Instructions (Signed)
Multiple Sclerosis (ICD-10: G35) Assessment: Stable on monthly natalizumab. Current infection may be more severe/prolonged due to immunosuppression. Plan:  Continue all MS medications including natalizumab Monitor for any exacerbation of MS symptoms during infection Maintain close communication with MS specialist if condition worsens  Preventive Care:  Advised regarding importance of infection prevention measures given immunocompromised status Emphasized need for reliable contraception while on doxycycline Recommended mask use in public during acute illness  Follow-up:  Return to clinic in 1 week if symptoms not improving Seek emergency care if developing severe symptoms Continue routine MS care as scheduled

## 2023-10-25 NOTE — Assessment & Plan Note (Signed)
Multiple Sclerosis (ICD-10: G35) Assessment: Stable on monthly natalizumab. Current infection may be more severe/prolonged due to immunosuppression. Plan:  Continue all MS medications including natalizumab Monitor for any exacerbation of MS symptoms during infection Maintain close communication with MS specialist if condition worsens  Preventive Care:  Advised regarding importance of infection prevention measures given immunocompromised status Emphasized need for reliable contraception while on doxycycline Recommended mask use in public during acute illness  Follow-up:  Return to clinic in 1 week if symptoms not improving Seek emergency care if developing severe symptoms Continue routine MS care as scheduled

## 2023-10-30 ENCOUNTER — Emergency Department (HOSPITAL_BASED_OUTPATIENT_CLINIC_OR_DEPARTMENT_OTHER)
Admission: EM | Admit: 2023-10-30 | Discharge: 2023-10-30 | Disposition: A | Discharge status: Home / Self Care | Payer: 59 | Attending: Emergency Medicine | Admitting: Emergency Medicine

## 2023-10-30 ENCOUNTER — Encounter (HOSPITAL_BASED_OUTPATIENT_CLINIC_OR_DEPARTMENT_OTHER): Payer: Self-pay | Admitting: Emergency Medicine

## 2023-10-30 ENCOUNTER — Other Ambulatory Visit: Payer: Self-pay

## 2023-10-30 DIAGNOSIS — J101 Influenza due to other identified influenza virus with other respiratory manifestations: Secondary | ICD-10-CM | POA: Diagnosis not present

## 2023-10-30 DIAGNOSIS — R0981 Nasal congestion: Secondary | ICD-10-CM | POA: Diagnosis present

## 2023-10-30 DIAGNOSIS — Z20822 Contact with and (suspected) exposure to covid-19: Secondary | ICD-10-CM | POA: Insufficient documentation

## 2023-10-30 DIAGNOSIS — Z79899 Other long term (current) drug therapy: Secondary | ICD-10-CM | POA: Diagnosis not present

## 2023-10-30 DIAGNOSIS — J111 Influenza due to unidentified influenza virus with other respiratory manifestations: Secondary | ICD-10-CM

## 2023-10-30 LAB — RESP PANEL BY RT-PCR (RSV, FLU A&B, COVID)  RVPGX2
Influenza A by PCR: POSITIVE — AB
Influenza B by PCR: NEGATIVE
Resp Syncytial Virus by PCR: NEGATIVE
SARS Coronavirus 2 by RT PCR: NEGATIVE

## 2023-10-30 MED ORDER — OXYMETAZOLINE HCL 0.05 % NA SOLN
1.0000 | Freq: Once | NASAL | Status: AC
  Administered 2023-10-30: 1 via NASAL
  Filled 2023-10-30: qty 30

## 2023-10-30 MED ORDER — IBUPROFEN 800 MG PO TABS
800.0000 mg | ORAL_TABLET | Freq: Once | ORAL | Status: AC
  Administered 2023-10-30: 800 mg via ORAL
  Filled 2023-10-30: qty 1

## 2023-10-30 NOTE — ED Triage Notes (Signed)
Intense sinus pressure. Has had sinus infection since thanksgiving. Prescribed doxy last week for sinus infection. Pt feels worse not better, and now her head is hurting.

## 2023-10-30 NOTE — Discharge Instructions (Signed)
Use afrin every 12 hours as needed for nasal congestion.  Motrin 600 mg and/or Tylenol 650mg  every 6 hours as needed for fevers/body aches/headaches.

## 2023-10-30 NOTE — ED Notes (Signed)
Pt given discharge instructions and reviewed prescriptions. Opportunities given for questions. Pt verbalizes understanding. Madi Bonfiglio R, RN 

## 2023-10-30 NOTE — ED Provider Notes (Signed)
[place] EMERGENCY DEPARTMENT AT [facility] Provider Note   CSN: [id number] Arrival date & time: 10/30/23  1107     History {Add pertinent medical, surgical, social history, OB history to HPI:1} No chief complaint on file.   [name] is a 37 y.o. female.  Patient is a 37 yo female presenting for nasal congestion, sore throat, headache sinus pressure, cough. Pt recently tested positive for strep 6 days ago and sent home with doxy (penicillin allergy). Had negative covid, flu, rsv at this time.  Admits to continued headache and nasal congestion.   The history is provided by the patient. No language interpreter was used.       Home Medications Prior to Admission medications   Medication Sig Start Date End Date Taking? Authorizing Provider  amLODipine (NORVASC) 2.5 MG tablet TAKE 1 TABLET BY MOUTH DAILY 03/16/23   Shelva Majestic, MD  Azelastine-Fluticasone [phone] MCG/ACT SUSP SMARTSIG:1 Spray(s) Both Nares Twice Daily PRN 10/14/23   [provider]  baclofen (LIORESAL) 10 MG tablet Take 1 tablet (10 mg total) by mouth 3 (three) times daily. 02/25/22   Sater, Pearletha Furl, MD  benzonatate (TESSALON) 200 MG capsule Take 1 capsule (200 mg total) by mouth 3 (three) times daily as needed. 10/25/23   Lula Olszewski, MD  busPIRone (BUSPAR) 5 MG tablet TAKE 1 TABLET BY MOUTH 3 TIMES DAILY AS NEEDED. 02/01/22   Shelva Majestic, MD  cetirizine (ZYRTEC) 10 MG tablet TAKE 1 TABLET BY MOUTH EVERY DAY AS NEEDED FOR ALLERGY 01/14/23   Shelva Majestic, MD  doxycycline (VIBRA-TABS) 100 MG tablet Take 1 tablet (100 mg total) by mouth 2 (two) times daily. Take with full glass of water. 10/25/23   Lula Olszewski, MD  fluticasone [facility]) 50 MCG/ACT nasal spray TWO SPRAYS EACH NOSTRIL ONCE A DAY 06/29/17   Sater, Pearletha Furl, MD  ibuprofen (ADVIL,MOTRIN) 800 MG tablet Take 1 tablet (800 mg total) by mouth every 8 (eight) hours as needed (mild pain). 02/23/18   Richarda Overlie, MD  natalizumab (TYSABRI) 300 MG/15ML injection Inject 300 mg into the vein every 30 (thirty) days.    [provider]  oxybutynin (DITROPAN-XL) 10 MG 24 hr tablet TAKE 1 TABLET BY MOUTH DAILY AT  BEDTIME 02/14/23   Sater, Richard A, MD  rizatriptan (MAXALT-MLT) 10 MG disintegrating tablet TAKE 1 TABLET BY MOUTH AS NEEDED FOR MIGRAINE. MAY REPEAT IN 2 HOURS IF NEEDED 02/24/22   Shelva Majestic, MD  topiramate (TOPAMAX) 50 MG tablet TAKE 1 TABLET BY MOUTH TWICE  DAILY 09/26/23   Sater, Pearletha Furl, MD  triamterene-hydrochlorothiazide (MAXZIDE-25) 37.5-25 MG tablet Take 1 tablet by mouth daily. 08/29/23   Shelva Majestic, MD      Allergies    Amoxicillin    Review of Systems   Review of Systems  Constitutional:  Negative for chills and fever.  HENT:  Negative for ear pain and sore throat.   Eyes:  Negative for pain and visual disturbance.  Respiratory:  Negative for cough and shortness of breath.   Cardiovascular:  Negative for chest pain and palpitations.  Gastrointestinal:  Negative for abdominal pain and vomiting.  Genitourinary:  Negative for dysuria and hematuria.  Musculoskeletal:  Negative for arthralgias and back pain.  Skin:  Negative for color change and rash.  Neurological:  Negative for seizures and syncope.  All other systems reviewed and are negative.   Physical Exam Updated Vital Signs BP (!) 188/98 (BP  Location: Right Arm)   Pulse 73   Temp 99.4 F (37.4 C) (Oral)   Resp 18   Wt 98.4 kg   SpO2 100%   BMI 36.11 kg/m  Physical Exam Vitals and nursing note reviewed.  Constitutional:      General: She is not in acute distress.    Appearance: She is well-developed.  HENT:     Head: Normocephalic and atraumatic.  Eyes:     Conjunctiva/sclera: Conjunctivae normal.  Cardiovascular:     Rate and Rhythm: Normal rate and regular rhythm.     Heart sounds: No murmur heard. Pulmonary:     Effort: Pulmonary effort is normal. No respiratory distress.      Breath sounds: Normal breath sounds.  Abdominal:     Palpations: Abdomen is soft.     Tenderness: There is no abdominal tenderness.  Musculoskeletal:        General: No swelling.     Cervical back: Neck supple.  Skin:    General: Skin is warm and dry.     Capillary Refill: Capillary refill takes less than 2 seconds.  Neurological:     Mental Status: She is alert.  Psychiatric:        Mood and Affect: Mood normal.     ED Results / Procedures / Treatments   Labs (all labs ordered are listed, but only abnormal results are displayed) Labs Reviewed  RESP PANEL BY RT-PCR (RSV, FLU A&B, COVID)  RVPGX2 - Abnormal; Notable for the following components:      Result Value   Influenza A by PCR POSITIVE (*)    All other components within normal limits    EKG None  Radiology No results found.  Procedures Procedures  {Document cardiac monitor, telemetry assessment procedure when appropriate:1}  Medications Ordered in ED Medications - No data to display  ED Course/ Medical Decision Making/ A&P   {   Click here for ABCD2, HEART and other calculatorsREFRESH Note before signing :1}                              Medical Decision Making  37 yo female presenting for nasal congestion, sore throat, headache sinus pressure, cough.  {Document critical care time when appropriate:1} {Document review of labs and clinical decision tools ie heart score, Chads2Vasc2 etc:1}  {Document your independent review of radiology images, and any outside records:1} {Document your discussion with family members, caretakers, and with consultants:1} {Document social determinants of health affecting pt's care:1} {Document your decision making why or why not admission, treatments were needed:1} Final Clinical Impression(s) / ED Diagnoses Final diagnoses:  None    Rx / DC Orders ED Discharge Orders     None

## 2023-12-02 ENCOUNTER — Other Ambulatory Visit: Payer: Self-pay | Admitting: Neurology

## 2023-12-21 ENCOUNTER — Other Ambulatory Visit: Payer: Self-pay

## 2023-12-21 MED ORDER — TRIAMTERENE-HCTZ 37.5-25 MG PO TABS: 1.0000 | ORAL_TABLET | Freq: Every day | ORAL | 3 refills | Status: AC

## 2024-01-12 ENCOUNTER — Telehealth (INDEPENDENT_AMBULATORY_CARE_PROVIDER_SITE_OTHER): Payer: Self-pay | Admitting: Nurse Practitioner

## 2024-01-12 DIAGNOSIS — J0110 Acute frontal sinusitis, unspecified: Secondary | ICD-10-CM

## 2024-01-12 MED ORDER — DOXYCYCLINE HYCLATE 100 MG PO TABS: 100.0000 mg | ORAL_TABLET | Freq: Two times a day (BID) | ORAL | 0 refills | Status: DC

## 2024-01-12 NOTE — Progress Notes (Addendum)
 Virtual Visit via Video Note  Madelaine Bhat, CMA,acting as a scribe for Arnette Felts, FNP.,have documented all relevant documentation on the behalf of Arnette Felts, FNP,as directed by  Arnette Felts, FNP while in the presence of Arnette Felts, FNP.  I connected with [name] on 01/12/2024 at  4:20 PM EST by a video enabled telemedicine application and verified that I am speaking with the correct person using two identifiers.  Patient Location: Home Provider Location: Office/Clinic  I discussed the limitations, risks, security, and privacy concerns of performing an evaluation and management service by video and the availability of in person appointments. I also discussed with the patient that there may be a patient responsible charge related to this service. The patient expressed understanding and agreed to proceed.  Subjective: PCP: Shelva Majestic, MD  No chief complaint on file.  Patient presents today for a possible sinus infection. Patient denies any chest pain, SOB, or headaches. Patient currently has "dry congestion" for the past 2 weeks. She has to sleep a certain way at night to avoid feeling like she is choking.   Patient did covid and flu test last week and it was negative.      ROS: Per HPI  Current Outpatient Medications:    amLODipine (NORVASC) 2.5 MG tablet, TAKE 1 TABLET BY MOUTH DAILY, Disp: 90 tablet, Rfl: 3   Azelastine-Fluticasone 137-50 MCG/ACT SUSP, SMARTSIG:1 Spray(s) Both Nares Twice Daily PRN, Disp: , Rfl:    baclofen (LIORESAL) 10 MG tablet, Take 1 tablet (10 mg total) by mouth 3 (three) times daily., Disp: 270 tablet, Rfl: 1   benzonatate (TESSALON) 200 MG capsule, Take 1 capsule (200 mg total) by mouth 3 (three) times daily as needed., Disp: 30 capsule, Rfl: 1   busPIRone (BUSPAR) 5 MG tablet, TAKE 1 TABLET BY MOUTH 3 TIMES DAILY AS NEEDED., Disp: 60 tablet, Rfl: 2   cetirizine (ZYRTEC) 10 MG tablet, TAKE 1 TABLET BY MOUTH EVERY DAY AS  NEEDED FOR ALLERGY, Disp: 90 tablet, Rfl: 1   doxycycline (VIBRA-TABS) 100 MG tablet, Take 1 tablet (100 mg total) by mouth 2 (two) times daily., Disp: 14 tablet, Rfl: 0   fluticasone (FLONASE) 50 MCG/ACT nasal spray, TWO SPRAYS EACH NOSTRIL ONCE A DAY, Disp: 1 g, Rfl: 0   ibuprofen (ADVIL,MOTRIN) 800 MG tablet, Take 1 tablet (800 mg total) by mouth every 8 (eight) hours as needed (mild pain)., Disp: 30 tablet, Rfl: 0   natalizumab (TYSABRI) 300 MG/15ML injection, Inject 300 mg into the vein every 30 (thirty) days., Disp: , Rfl:    oxybutynin (DITROPAN-XL) 10 MG 24 hr tablet, TAKE 1 TABLET BY MOUTH DAILY AT  BEDTIME, Disp: 90 tablet, Rfl: 3   rizatriptan (MAXALT-MLT) 10 MG disintegrating tablet, TAKE 1 TABLET BY MOUTH AS NEEDED FOR MIGRAINE. MAY REPEAT IN 2 HOURS IF NEEDED, Disp: 9 tablet, Rfl: 3   topiramate (TOPAMAX) 50 MG tablet, TAKE 1 TABLET BY MOUTH TWICE  DAILY, Disp: 180 tablet, Rfl: 3   triamterene-hydrochlorothiazide (MAXZIDE-25) 37.5-25 MG tablet, Take 1 tablet by mouth daily., Disp: 90 tablet, Rfl: 3  Observations/Objective: There were no vitals filed for this visit. Physical Exam Vitals reviewed.  Constitutional:      General: She is not in acute distress.    Appearance: Normal appearance.  Pulmonary:     Effort: Pulmonary effort is normal. No respiratory distress.  Neurological:     Mental Status: She is alert.  Psychiatric:  Mood and Affect: Mood normal.        Behavior: Behavior normal.        Thought Content: Thought content normal.        Judgment: Judgment normal.     Assessment and Plan: Acute non-recurrent frontal sinusitis -     Doxycycline Hyclate; Take 1 tablet (100 mg total) by mouth 2 (two) times daily.  Dispense: 14 tablet; Refill: 0    Follow Up Instructions: No follow-ups on file.   I discussed the assessment and treatment plan with the patient. The patient was provided an opportunity to ask questions, and all were answered. The patient agreed  with the plan and demonstrated an understanding of the instructions.   The patient was advised to call back or seek an in-person evaluation if the symptoms worsen or if the condition fails to improve as anticipated.  The above assessment and management plan was discussed with the patient. The patient verbalized understanding of and has agreed to the management plan.   Jeanell Sparrow, FNP, have reviewed all documentation for this visit. The documentation on 01/26/24 for the exam, diagnosis, procedures, and orders are all accurate and complete.

## 2024-01-12 NOTE — Progress Notes (Deleted)
 Madelaine Bhat, CMA,acting as a Neurosurgeon for Arnette Felts, FNP.,have documented all relevant documentation on the behalf of Arnette Felts, FNP,as directed by  Arnette Felts, FNP while in the presence of Arnette Felts, FNP.  Subjective:  Patient ID: [name] , female    DOB: July 12, 1986 , 38 y.o.   MRN: [id number]  No chief complaint on file.   HPI  Patient presents today for , Patient reports compliance with medication. Patient denies any chest pain, SOB, or headaches. Patient has no concerns today.     Past Medical History:  Diagnosis Date  . Allergic rhinitis due to pollen 02/06/2010  . Esophageal reflux 11/29/2008  . HEADACHE, CLUSTER, CHRONIC 02/06/2010   patient reports migraines, not cluster headaches from problem list  . Hypertension   . Multiple sclerosis (HCC)   . Neuromuscular disorder (HCC)    multiple sclerosis     Family History  Problem Relation Age of Onset  . Lung cancer Maternal Grandfather   . Hypertension Mother   . Hypertension Father   . Mental illness Maternal Grandmother   . Diabetes Maternal Grandmother   . Glaucoma Paternal Grandmother   . Hypertension Paternal Grandfather      Current Outpatient Medications:  .  amLODipine (NORVASC) 2.5 MG tablet, TAKE 1 TABLET BY MOUTH DAILY, Disp: 90 tablet, Rfl: 3 .  Azelastine-Fluticasone 137-50 MCG/ACT SUSP, SMARTSIG:1 Spray(s) Both Nares Twice Daily PRN, Disp: , Rfl:  .  baclofen (LIORESAL) 10 MG tablet, Take 1 tablet (10 mg total) by mouth 3 (three) times daily., Disp: 270 tablet, Rfl: 1 .  benzonatate (TESSALON) 200 MG capsule, Take 1 capsule (200 mg total) by mouth 3 (three) times daily as needed., Disp: 30 capsule, Rfl: 1 .  busPIRone (BUSPAR) 5 MG tablet, TAKE 1 TABLET BY MOUTH 3 TIMES DAILY AS NEEDED., Disp: 60 tablet, Rfl: 2 .  cetirizine (ZYRTEC) 10 MG tablet, TAKE 1 TABLET BY MOUTH EVERY DAY AS NEEDED FOR ALLERGY, Disp: 90 tablet, Rfl: 1 .  doxycycline (VIBRA-TABS) 100 MG tablet, Take 1 tablet  (100 mg total) by mouth 2 (two) times daily. Take with full glass of water., Disp: 14 tablet, Rfl: 0 .  fluticasone (FLONASE) 50 MCG/ACT nasal spray, TWO SPRAYS EACH NOSTRIL ONCE A DAY, Disp: 1 g, Rfl: 0 .  ibuprofen (ADVIL,MOTRIN) 800 MG tablet, Take 1 tablet (800 mg total) by mouth every 8 (eight) hours as needed (mild pain)., Disp: 30 tablet, Rfl: 0 .  natalizumab (TYSABRI) 300 MG/15ML injection, Inject 300 mg into the vein every 30 (thirty) days., Disp: , Rfl:  .  oxybutynin (DITROPAN-XL) 10 MG 24 hr tablet, TAKE 1 TABLET BY MOUTH DAILY AT  BEDTIME, Disp: 90 tablet, Rfl: 3 .  rizatriptan (MAXALT-MLT) 10 MG disintegrating tablet, TAKE 1 TABLET BY MOUTH AS NEEDED FOR MIGRAINE. MAY REPEAT IN 2 HOURS IF NEEDED, Disp: 9 tablet, Rfl: 3 .  topiramate (TOPAMAX) 50 MG tablet, TAKE 1 TABLET BY MOUTH TWICE  DAILY, Disp: 180 tablet, Rfl: 3 .  triamterene-hydrochlorothiazide (MAXZIDE-25) 37.5-25 MG tablet, Take 1 tablet by mouth daily., Disp: 90 tablet, Rfl: 3   Allergies  Allergen Reactions  . Amoxicillin Rash     Review of Systems   There were no vitals filed for this visit. There is no height or weight on file to calculate BMI.  Wt Readings from Last 3 Encounters:  10/30/23 217 lb (98.4 kg)  10/25/23 219 lb 12.8 oz (99.7 kg)  08/23/23 217 lb (98.4 kg)  The ASCVD Risk score (Arnett DK, et al., 2019) failed to calculate for the following reasons:   The 2019 ASCVD risk score is only valid for ages 95 to 64  Objective:  Physical Exam      Assessment And Plan:  There are no diagnoses linked to this encounter.  No follow-ups on file.  Patient was given opportunity to ask questions. Patient verbalized understanding of the plan and was able to repeat key elements of the plan. All questions were answered to their satisfaction.    Jeanell Sparrow, FNP, have reviewed all documentation for this visit. The documentation on 01/12/24 for the exam, diagnosis, procedures, and orders are all accurate  and complete.   IF YOU HAVE BEEN REFERRED TO A SPECIALIST, IT MAY TAKE 1-2 WEEKS TO SCHEDULE/PROCESS THE REFERRAL. IF YOU HAVE NOT HEARD FROM US/SPECIALIST IN TWO WEEKS, PLEASE GIVE [place] A CALL AT [phone].

## 2024-02-06 NOTE — Progress Notes (Signed)
 Updated.

## 2024-02-07 ENCOUNTER — Encounter: Payer: Self-pay | Admitting: Family Medicine

## 2024-02-07 ENCOUNTER — Ambulatory Visit (INDEPENDENT_AMBULATORY_CARE_PROVIDER_SITE_OTHER): Payer: 59 | Admitting: Family Medicine

## 2024-02-07 VITALS — BP 122/80 | HR 89 | Temp 97.7°F | Ht 65.0 in | Wt 215.4 lb

## 2024-02-07 DIAGNOSIS — Z0000 Encounter for general adult medical examination without abnormal findings: Secondary | ICD-10-CM | POA: Diagnosis not present

## 2024-02-07 DIAGNOSIS — G35 Multiple sclerosis: Secondary | ICD-10-CM

## 2024-02-07 DIAGNOSIS — Z131 Encounter for screening for diabetes mellitus: Secondary | ICD-10-CM

## 2024-02-07 DIAGNOSIS — I10 Essential (primary) hypertension: Secondary | ICD-10-CM

## 2024-02-07 DIAGNOSIS — E669 Obesity, unspecified: Secondary | ICD-10-CM

## 2024-02-07 DIAGNOSIS — D849 Immunodeficiency, unspecified: Secondary | ICD-10-CM

## 2024-02-07 MED ORDER — FLUCONAZOLE 150 MG PO TABS: 150.0000 mg | ORAL_TABLET | Freq: Once | ORAL | 0 refills | Status: AC

## 2024-02-07 MED ORDER — PREDNISONE 10 MG PO TABS: 30.0000 mg | ORAL_TABLET | Freq: Every day | ORAL | 0 refills | Status: DC

## 2024-02-07 NOTE — Progress Notes (Signed)
 Phone [phone]   Subjective:  Patient presents today for their annual physical. Chief complaint-noted.   See problem oriented charting- ROS- full  review of systems was completed and negative except for: Lingering cough  The following were reviewed and entered/updated in epic: Past Medical History:  Diagnosis Date   Allergic rhinitis due to pollen 02/06/2010   Esophageal reflux 11/29/2008   HEADACHE, CLUSTER, CHRONIC 02/06/2010   patient reports migraines, not cluster headaches from problem list   Hypertension    Multiple sclerosis (HCC)    Neuromuscular disorder (HCC)    multiple sclerosis   Patient Active Problem List   Diagnosis Date Noted   Immunosuppressed status (HCC) 07/24/2020    Priority: High   Relapsing remitting multiple sclerosis (HCC) 04/09/2016    Priority: High   OAB (overactive bladder) 07/27/2022    Priority: Medium    OSA (obstructive sleep apnea) 09/15/2020    Priority: Medium    Anxiety 08/25/2018    Priority: Medium    Urinary frequency 04/09/2016    Priority: Medium    Essential hypertension 04/09/2016    Priority: Medium    Migraine 03/29/2016    Priority: Medium    Leiomyoma 02/21/2018    Priority: Low   High risk medication use 04/09/2016    Priority: Low   Allergic rhinitis due to pollen 02/06/2010    Priority: Low   Past Surgical History:  Procedure Laterality Date   CESAREAN SECTION  06/29/2012   Procedure: CESAREAN SECTION;  Surgeon: Leslie Andrea, MD;  Location: [facility];  Service: Obstetrics;  Laterality: N/A;   IUD REMOVAL  2018   MYOMECTOMY N/A 02/21/2018   Procedure: MYOMECTOMY, ABDOMINAL;  Surgeon: Richarda Overlie, MD;  Location: [facility];  Service: Gynecology;  Laterality: N/A;    Family History  Problem Relation Age of Onset   Lung cancer Maternal Grandfather    Hypertension Mother    Hypertension Father    Mental illness Maternal Grandmother    Diabetes Maternal Grandmother    Glaucoma Paternal Grandmother     Hypertension Paternal Grandfather     Medications- reviewed and updated Current Outpatient Medications  Medication Sig Dispense Refill   amLODipine (NORVASC) 2.5 MG tablet TAKE 1 TABLET BY MOUTH DAILY 90 tablet 3   Azelastine-Fluticasone 137-50 MCG/ACT SUSP SMARTSIG:1 Spray(s) Both Nares Twice Daily PRN     baclofen (LIORESAL) 10 MG tablet Take 1 tablet (10 mg total) by mouth 3 (three) times daily. 270 tablet 1   busPIRone (BUSPAR) 5 MG tablet TAKE 1 TABLET BY MOUTH 3 TIMES DAILY AS NEEDED. 60 tablet 2   cetirizine (ZYRTEC) 10 MG tablet TAKE 1 TABLET BY MOUTH EVERY DAY AS NEEDED FOR ALLERGY 90 tablet 1   fluticasone (FLONASE) 50 MCG/ACT nasal spray TWO SPRAYS EACH NOSTRIL ONCE A DAY 1 g 0   ibuprofen (ADVIL,MOTRIN) 800 MG tablet Take 1 tablet (800 mg total) by mouth every 8 (eight) hours as needed (mild pain). 30 tablet 0   natalizumab (TYSABRI) 300 MG/15ML injection Inject 300 mg into the vein every 30 (thirty) days.     oxybutynin (DITROPAN-XL) 10 MG 24 hr tablet TAKE 1 TABLET BY MOUTH DAILY AT  BEDTIME 90 tablet 3   predniSONE (DELTASONE) 10 MG tablet Take 3 tablets (30 mg total) by mouth daily with breakfast. 15 tablet 0   rizatriptan (MAXALT-MLT) 10 MG disintegrating tablet TAKE 1 TABLET BY MOUTH AS NEEDED FOR MIGRAINE. MAY REPEAT IN 2 HOURS IF NEEDED 9 tablet 3  topiramate (TOPAMAX) 50 MG tablet TAKE 1 TABLET BY MOUTH TWICE  DAILY 180 tablet 3   triamterene-hydrochlorothiazide (MAXZIDE-25) 37.5-25 MG tablet Take 1 tablet by mouth daily. 90 tablet 3   No current facility-administered medications for this visit.    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   Caffeine none.   Objective  Objective:  BP 122/80   Pulse 89   Temp 97.7 F (36.5 C)   Ht 5\' 5"  (1.651 m)   Wt 215 lb 6.4 oz (97.7 kg)   BMI 35.84 kg/m  Gen: NAD, resting comfortably HEENT: Mucous membranes are moist. Oropharynx normal other than some drainage. Nasal turbinates are edematous and pale with clear discharge Neck: no thyromegaly CV: RRR no murmurs rubs or gallops Lungs: CTAB no crackles, wheeze, rhonchi Abdomen: soft/nontender/nondistended/normal bowel sounds. No rebound or guarding.  Ext: no edema Skin: warm, dry Neuro: grossly normal, moves all extremities, PERRLA   Assessment and Plan   38 y.o. female presenting for annual physical.  Health Maintenance counseling: 1. Anticipatory guidance: Patient counseled regarding regular dental exams -q6 months, eye exams - yearly and wants to get blue light glasses,  avoiding smoking and second hand smoke , limiting alcohol to 1 beverage per day- once a month maybe , no illicit drugs .   2. Risk factor reduction:  Advised patient of need for regular exercise and diet rich and fruits and vegetables to reduce risk of heart attack and stroke.  Exercise- started back with trainer very recently today.  Diet/weight management-weight largely stable in the 210s for last few years-has gotten as low as 170 in adult life- has improved diet and cut out red meat, added more vegetables. Thinks can cut down on rice portions- changed to brown rice  Wt Readings from Last 3 Encounters:  02/07/24 215 lb 6.4 oz (97.7 kg)  10/30/23 217 lb (98.4 kg)  10/25/23 219 lb 12.8 oz (99.7 kg)  3. Immunizations/screenings/ancillary studies-no flu shot on the tysabri, up to date on COVID, could do Prevnar 20  Immunization History  Administered Date(s) Administered   Influenza Split 08/30/2011   Moderna Covid-19 Fall Seasonal Vaccine 20yrs & older 10/30/2023   PFIZER(Purple Top)SARS-COV-2 Vaccination 02/22/2020,  03/17/2020, 02/26/2021   Pfizer Covid-19 Vaccine Bivalent Booster 63yrs & up 03/27/2022   Tdap 03/01/2021  4. Cervical cancer screening-  12/31/21 with 3 year repeat with GYN Dr. Marcelle Overlie 5. Breast cancer screening-  breast exam with GYN and mammogram - starting at 40- baseline exam a few years ago 6. Colon cancer screening - no family history, start at age 20  7. Skin cancer screening- lower risk due to melanin content. advised regular sunscreen use. Denies worrisome, changing, or new skin lesions.  8. Birth control/STD check- no cycle since getting nexplanon out- was told may be PCOS- may have to get ultrasound soon. Monogamous with fathers son- no short term disability screening needed  9. Osteoporosis screening at 1- will plan on this.  10. Smoking associated screening - never smoker  Status of chronic or acute concerns   # Lingering  cough-patient with cough that started with influenza B few weeks ago but she has had a persistent cough with this. Spoke to [facility] over the weekend and was started on clindamycin. Was also given ninjacof but not helpful.  - No sinus pressure after starting clindamycin but did have before, no fevers, no shortness of breath or wheezing.   -also had influenza A in December -in similar situations post viral or post bacterial she has presonded well to prednisone- she would like to trial low dose and this was sent in- exam looked almost allergic like symptoms and this would certainly help with that- new or worsening symptoms she will let us know  - tends to need yeast infection pill afterwards- sent in today  -discussed risks of C diff on clindamycin  # Social update-was considering pregnancy last year but ended up putting a pause on that - may reconsider.    #Prediabetes - a1c was at 5.7 with GYN- last month . Wants to work on healthy eating and regular exercise    #Hypertension S: Compliant with triamterene hydrochlorothiazide 37.5-25 mg, amlodipine 2.5 mg A/P:  well controlled continue current medications - would need to look at changing medications if decided to pursue pregnancy    % Multiple sclerosis %migraines S: Patient is doing reasonably well on Tysabri.  Continues to follow with Dr. Epimenio Foot. -also on topamax for migraines at 50 mg- doing ok- less intense A/P: MS reasonably stable- continue current medications Migraines doing reasonably well lately  . Rizatriptan a few weeks ago  #OSA on cpap- started in early 2024- consistent   %Overactive bladder/urinary frequency- oxybutynin lhelpful   #Anxiety  S: 2024very sparingly using busprione- if chooses to get pregnant would likely hold medication- not needing much - maybe 6 months A/P: doing well- continue to monitor     #labs- we will do next visit with a1c- has had labs with gynecology within a month and will likely get labs with Dr. Epimenio Foot next month  Recommended follow up: Return in about 6 months (around 08/09/2024) for followup or sooner if needed.Schedule b4 you leave. Prediabetes reevaluation Future Appointments  Date Time Provider Department Center  03/06/2024 10:00 AM Sater, Pearletha Furl, MD [facility] None    Lab/Order associations: fasting   ICD-10-CM   1. Preventative health care  Z00.00     2. Relapsing remitting multiple sclerosis (HCC)  G35     3. Immunosuppressed status (HCC)  D84.9     4. Essential hypertension  I10     5. Screening for diabetes mellitus  Z13.1     6. Obesity (BMI 30-39.9)  E66.9       Meds ordered this encounter  Medications   predniSONE (DELTASONE) 10 MG tablet    Sig: Take 3 tablets (30 mg total) by mouth daily with breakfast.    Dispense:  15 tablet    Refill:  0    Return precautions advised.  Tana Conch, MD

## 2024-02-07 NOTE — Patient Instructions (Addendum)
 Consider [employer] app or myfitnesspal  Ask Dr. Epimenio Foot about Prevnar 20- pneumonia shot  -in similar situations post viral or post bacterial she has presonded well to prednisone- she would like to trial low dose and this was sent in- exam looked almost allergic like symptoms and this would certainly help with that- new or worsening symptoms she will let us know  -still finish clindamycin  Recommended follow up: Return in about 6 months (around 08/09/2024) for followup or sooner if needed.Schedule b4 you leave. For prediabetes reeevaluation

## 2024-02-15 ENCOUNTER — Other Ambulatory Visit: Payer: Self-pay | Admitting: Family Medicine

## 2024-03-01 ENCOUNTER — Other Ambulatory Visit: Payer: Self-pay | Admitting: *Deleted

## 2024-03-01 DIAGNOSIS — G35 Multiple sclerosis: Secondary | ICD-10-CM

## 2024-03-01 DIAGNOSIS — Z79899 Other long term (current) drug therapy: Secondary | ICD-10-CM

## 2024-03-05 ENCOUNTER — Other Ambulatory Visit: Payer: Self-pay

## 2024-03-05 ENCOUNTER — Telehealth: Payer: Self-pay | Admitting: *Deleted

## 2024-03-05 DIAGNOSIS — Z79899 Other long term (current) drug therapy: Secondary | ICD-10-CM

## 2024-03-05 DIAGNOSIS — G35 Multiple sclerosis: Secondary | ICD-10-CM

## 2024-03-05 NOTE — Telephone Encounter (Signed)
 Placed [facility] lab in [facility] lock box for routine lab pick up. Results pending.

## 2024-03-06 ENCOUNTER — Ambulatory Visit (INDEPENDENT_AMBULATORY_CARE_PROVIDER_SITE_OTHER): Payer: 59 | Admitting: Neurology

## 2024-03-06 ENCOUNTER — Encounter: Payer: Self-pay | Admitting: Neurology

## 2024-03-06 VITALS — BP 138/85 | HR 74 | Ht 65.0 in | Wt 216.2 lb

## 2024-03-06 DIAGNOSIS — G43109 Migraine with aura, not intractable, without status migrainosus: Secondary | ICD-10-CM

## 2024-03-06 DIAGNOSIS — G4733 Obstructive sleep apnea (adult) (pediatric): Secondary | ICD-10-CM

## 2024-03-06 DIAGNOSIS — G35 Multiple sclerosis: Secondary | ICD-10-CM

## 2024-03-06 DIAGNOSIS — R5383 Other fatigue: Secondary | ICD-10-CM

## 2024-03-06 DIAGNOSIS — R350 Frequency of micturition: Secondary | ICD-10-CM

## 2024-03-06 DIAGNOSIS — Z79899 Other long term (current) drug therapy: Secondary | ICD-10-CM

## 2024-03-06 LAB — CBC WITH DIFFERENTIAL/PLATELET
Basophils Absolute: 0.1 10*3/uL (ref 0.0–0.2)
Basos: 1 %
EOS (ABSOLUTE): 0.4 10*3/uL (ref 0.0–0.4)
Eos: 4 %
Hematocrit: 37.7 % (ref 34.0–46.6)
Hemoglobin: 12.5 g/dL (ref 11.1–15.9)
Immature Grans (Abs): 0 10*3/uL (ref 0.0–0.1)
Immature Granulocytes: 0 %
Lymphocytes Absolute: 6.2 10*3/uL — ABNORMAL HIGH (ref 0.7–3.1)
Lymphs: 60 %
MCH: 28.9 pg (ref 26.6–33.0)
MCHC: 33.2 g/dL (ref 31.5–35.7)
MCV: 87 fL (ref 79–97)
Monocytes Absolute: 0.9 10*3/uL (ref 0.1–0.9)
Monocytes: 9 %
NRBC: 2 % — ABNORMAL HIGH (ref 0–0)
Neutrophils Absolute: 2.6 10*3/uL (ref 1.4–7.0)
Neutrophils: 26 %
Platelets: 293 10*3/uL (ref 150–450)
RBC: 4.32 x10E6/uL (ref 3.77–5.28)
RDW: 15.7 % — ABNORMAL HIGH (ref 11.7–15.4)
WBC: 10.2 10*3/uL (ref 3.4–10.8)

## 2024-03-06 MED ORDER — BACLOFEN 10 MG PO TABS: 10.0000 mg | ORAL_TABLET | Freq: Three times a day (TID) | ORAL | 1 refills | Status: AC

## 2024-03-06 MED ORDER — ALPRAZOLAM 0.5 MG PO TABS: ORAL_TABLET | ORAL | 0 refills | Status: DC

## 2024-03-06 NOTE — Progress Notes (Signed)
 [facility]  PATIENT: [name] DOB: 29-Apr-1986  REFERRING DOCTOR OR PCP:  Clarisa Crooked SOURCE: Patient, notes from Hospital, lab and other results from Hospital, MRI images personally reviewed on PACS  _________________________________   HISTORICAL  CHIEF COMPLAINT:  Chief Complaint  Patient presents with   Follow-up    Pt in room 10. Here for MS/OSA follow up. DMT: Tysabri  300mg . Pt reports doing well from MS standpoint and cpap is doing well. No concerns.     HISTORY OF PRESENT ILLNESS:  [name] is a 38 y.o. woman with relapsing remitting MS.       Update 03/06/2024: MS is stable.   She denies any exacerbations or new symptoms.    She is on Tysabri  and tolerates it well.  Her last MRI (brain and cervical spine) was 02/20/2020.  There were no new lesions.  JCV Ab was negative 08/23/2023 (0.21)  .  She has a new one pending from yesterday.      Gait is doing well.   Se has no falls.   She can walk 2 miles straight.  She may fall back from others on long walks.  She can go up and down stairs well. And only uses a bannister sometimes.     She does daily exercises.    Strength is stable and the right side is slightly weaker than her left (noted with working out).  No longer having much cramping - occasional lower back.    She denies tingling or numbness.    Her urinary urgency is helped by oxybutynin .    She has no new vision changes.    She has some fatigue  more than last year.  She is sleeping better on CPAP.     She had a sleep study in 2021 showing mild OSA=12.2.   She started CPAP for OSA and is tolerating it well .  Her D/L shows excellent efficacy with AHI = 0.3 and good compliance.  She wakes up refreshed and has fewer awakenings at night.    Mood is doing ok.   She is back on buspirone  for anxiety.   It has helped.     Migraines occur 1/7 days a week down form 4/7 since starting TPM.   They are helped by Maxalt      EPWORTH SLEEPINESS  SCALE  On a scale of 0 - 3 what is the chance of dozing:  Sitting and Reading:   0 Watching TV:     2 Sitting inactive in a public place: 0 Passenger in car for one hour: 1 Lying down to rest in the afternoon: 3 Sitting and talking to someone: 0 Sitting quietly after lunch:  0 In a car, stopped in traffic:  0  Total (out of 24):   Now 6/24 but was 14/24 before CPAP   MS History:    In May, 2017,  she had the onset of right arm numbness and weakness that developed over one day.    She went to the emergency room and an MRI of the brain on 03/28/2016 showed multiple T2/FLAIR hyperintense foci consistent with multiple sclerosis. She was admitted and received 5 days of IV steroids. While in the hospital she had a contrasted MRI of the brain that showed that 2 of those lesions enhanced consistent with more acute onset. Additionally, she had 2 lesions in the cervical spine, both enhancing. The one at C6 was towards the right and could explain her symptoms. Another one  was adjacent to C3-C4, posteriorly. She also had a lumbar puncture. The cerebrospinal fluid shows that there were 9 oligoclonal bands in the CSF which is also consistent with multiple sclerosis. Due to the aggressiveness of her MS, she was started on Tysabri . She is JCV antibody negative (0.25).   In retrospect, in 2011, she had headaches and tremors and had an MRI of the brain. It showed a single T2/FLAIR hyperintense focus in the right periventricular white matter of the parietal lobe.   Follow-up MRI in 2012 did not show any new lesions. The symptoms improved and she had no other symptoms until 2017  Imaging: MRI of the brain 03/16/2022 showed Multiple T2/FLAIR hyperintense foci in the hemispheres in a pattern consistent with chronic demyelinating plaque associated with multiple sclerosis.  None of the foci enhanced.  They do not appear to be acute.  Compared to the MRI from 03/11/2020, there were no new lesions.   MRI of the cervical  spine 03/16/2022 Two small foci within the spinal cord adjacent to C3-C4 and C6 consistent with chronic demyelinating plaque associated with multiple sclerosis.  2.   Minimal disc degenerative changes at C3-C4 and C4-C5 not leading to spinal stenosis or nerve root compression MRI of the cervical spine 03/11/2020 shows T2 hyperintense foci within the spinal cord posteriorly adjacent to C3-C4 and posterolaterally to the right adjacent to C6.  Minimal disc degenerative changes at C3-C4.  MRI of the brain 03/11/2020 shows Multiple T2/FLAIR hyperintense foci in the hemispheres in a pattern and configuration consistent with chronic demyelinating plaque associated with multiple sclerosis.  None of the foci appear to be acute and they were all present on the 08/29/2018 MRI.  MRI of the brain 08/29/2018 shows T2/FLAIR hyperintense foci in the hemispheres and left pons in a pattern and configuration consistent with chronic demyelinating plaque associated with multiple sclerosis.  None of the foci appear to be acute.  When compared to the MRI from May 2017, there are no new lesions.  Some of the foci that were acute with enhancement in 2017 are smaller on the current study and no longer enhance.  MRI of the thoracic spine 03/29/2016 was normal    REVIEW OF SYSTEMS: Review of Systems  Constitutional:  Positive for malaise/fatigue.  HENT:  Negative for congestion and hearing loss.   Eyes: Negative.  Negative for blurred vision, double vision, photophobia and pain.  Respiratory:  Negative for cough.   Cardiovascular:  Negative for chest pain.  Gastrointestinal:  Negative for abdominal pain, nausea and vomiting.  Genitourinary:  Positive for frequency and urgency. Negative for dysuria.  Musculoskeletal:  Negative for back pain and neck pain.  Skin:  Negative for itching and rash.  Neurological: Negative.   Endo/Heme/Allergies: Negative.   Psychiatric/Behavioral:  Negative for depression. The patient has  insomnia.     ALLERGIES: Allergies  Allergen Reactions   Amoxicillin  Rash    HOME MEDICATIONS:  Current Outpatient Medications:    ALPRAZolam  (XANAX ) 0.5 MG tablet, Take one or two before the MRi.  Must have a driver, Disp: 2 tablet, Rfl: 0   amLODipine  (NORVASC ) 2.5 MG tablet, TAKE 1 TABLET BY MOUTH DAILY, Disp: 90 tablet, Rfl: 3   Azelastine-Fluticasone  137-50 MCG/ACT SUSP, SMARTSIG:1 Spray(s) Both Nares Twice Daily PRN, Disp: , Rfl:    busPIRone  (BUSPAR ) 5 MG tablet, TAKE 1 TABLET BY MOUTH 3 TIMES DAILY AS NEEDED., Disp: 60 tablet, Rfl: 2   cetirizine  (ZYRTEC ) 10 MG tablet, TAKE 1 TABLET BY MOUTH  EVERY DAY AS NEEDED FOR ALLERGY, Disp: 90 tablet, Rfl: 1   fluticasone  (FLONASE ) 50 MCG/ACT nasal spray, TWO SPRAYS EACH NOSTRIL ONCE A DAY, Disp: 1 g, Rfl: 0   ibuprofen  (ADVIL ,MOTRIN ) 800 MG tablet, Take 1 tablet (800 mg total) by mouth every 8 (eight) hours as needed (mild pain)., Disp: 30 tablet, Rfl: 0   natalizumab  (TYSABRI ) 300 MG/15ML injection, Inject 300 mg into the vein every 30 (thirty) days., Disp: , Rfl:    oxybutynin  (DITROPAN -XL) 10 MG 24 hr tablet, TAKE 1 TABLET BY MOUTH DAILY AT  BEDTIME, Disp: 90 tablet, Rfl: 3   rizatriptan  (MAXALT -MLT) 10 MG disintegrating tablet, TAKE 1 TABLET BY MOUTH AS NEEDED FOR MIGRAINE. MAY REPEAT IN 2 HOURS IF NEEDED, Disp: 9 tablet, Rfl: 3   topiramate  (TOPAMAX ) 50 MG tablet, TAKE 1 TABLET BY MOUTH TWICE  DAILY, Disp: 180 tablet, Rfl: 3   triamterene -hydrochlorothiazide  (MAXZIDE -25) 37.5-25 MG tablet, Take 1 tablet by mouth daily., Disp: 90 tablet, Rfl: 3   baclofen  (LIORESAL ) 10 MG tablet, Take 1 tablet (10 mg total) by mouth 3 (three) times daily., Disp: 270 tablet, Rfl: 1   predniSONE  (DELTASONE ) 10 MG tablet, Take 3 tablets (30 mg total) by mouth daily with breakfast., Disp: 15 tablet, Rfl: 0  PAST MEDICAL HISTORY: Past Medical History:  Diagnosis Date   Allergic rhinitis due to pollen 02/06/2010   Esophageal reflux 11/29/2008   HEADACHE,  CLUSTER, CHRONIC 02/06/2010   patient reports migraines, not cluster headaches from problem list   Hypertension    Multiple sclerosis (HCC)    Neuromuscular disorder (HCC)    multiple sclerosis    PAST SURGICAL HISTORY: Past Surgical History:  Procedure Laterality Date   CESAREAN SECTION  06/29/2012   Procedure: CESAREAN SECTION;  Surgeon: Jeanmarie Millet, MD;  Location: [facility];  Service: Obstetrics;  Laterality: N/A;   IUD REMOVAL  2018   MYOMECTOMY N/A 02/21/2018   Procedure: MYOMECTOMY, ABDOMINAL;  Surgeon: Woodrow Hazy, MD;  Location: [facility];  Service: Gynecology;  Laterality: N/A;    FAMILY HISTORY: Family History  Problem Relation Age of Onset   Lung cancer Maternal Grandfather    Hypertension Mother    Hypertension Father    Mental illness Maternal Grandmother    Diabetes Maternal Grandmother    Glaucoma Paternal Grandmother    Hypertension Paternal Grandfather     SOCIAL HISTORY:  Social History   Socioeconomic History   Marital status: Single    Spouse name: Not on file   Number of children: 1   Years of education: Not on file   Highest education level: Not on file  Occupational History   Not on file  Tobacco Use   Smoking status: Never   Smokeless tobacco: Never  Vaping Use   Vaping status: Never Used  Substance and Sexual Activity   Alcohol use: Yes    Alcohol/week: 0.0 - 1.0 standard drinks of alcohol    Comment: occ   Drug use: No   Sexual activity: Yes    Birth control/protection: Implant  Other Topics Concern   Not on file  Social History Narrative   Lives at home with son (born 2013)  - moving to [place] spring 2024 to live with Father of son lives in [place]- long term relationship      Back in [place] in 2025- 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   Caffeine none.  Social Drivers of Corporate investment banker Strain: Not on file  Food Insecurity: Not on file   Transportation Needs: Not on file  Physical Activity: Not on file  Stress: Not on file  Social Connections: Not on file  Intimate Partner Violence: Not on file     PHYSICAL EXAM  Vitals:   03/06/24 0952  BP: 138/85  Pulse: 74  Weight: 216 lb 3.2 oz (98.1 kg)  Height: 5\' 5"  (1.651 m)    Body mass index is 35.98 kg/m.   General: The patient is well-developed and well-nourished and in no acute distress  Extremities:    No rash or edema.  Neurologic Exam  Mental status: The patient is alert and oriented x 3 at the time of the examination. The patient has apparent normal recent and remote memory, with an apparently normal attention span and concentration ability.   Speech is normal.  Cranial nerves: Extraocular movements are full..  Facial strength is normal... No obvious hearing deficits are noted.  Motor: Muscle bulk, tone and strength are normal.  Sensory: Sensory testing is intact to touch and vibration sensation in the arms and legs.   Coordination: Cerebellar testing reveals good finger-nose-finger and heel-to-shin bilaterally.  Gait and station: The station is normal and the gait is normal.  Tandem gait is near normal.  .  Romberg is negative.    Reflexes: Deep tendon reflexes are normal and symmetric bilaterally     DIAGNOSTIC DATA (LABS, IMAGING, TESTING) - I reviewed patient records, labs, notes, testing and imaging myself where available.  Lab Results  Component Value Date   WBC 10.2 03/05/2024   HGB 12.5 03/05/2024   HCT 37.7 03/05/2024   MCV 87 03/05/2024   PLT 293 03/05/2024      Component Value Date/Time   NA 136 08/23/2023 0920   K 4.2 08/23/2023 0920   CL 102 08/23/2023 0920   CO2 21 08/23/2023 0920   GLUCOSE 102 (H) 08/23/2023 0920   GLUCOSE 109 (H) 01/19/2022 1153   BUN 9 08/23/2023 0920   CREATININE 1.00 08/23/2023 0920   CREATININE 0.87 07/24/2020 1342   CALCIUM 9.5 08/23/2023 0920   PROT 7.9 08/23/2023 0920   ALBUMIN 4.5  08/23/2023 0920   ALBUMIN 3,800 03/31/2016 1024   AST 19 08/23/2023 0920   ALT 23 08/23/2023 0920   ALKPHOS 70 08/23/2023 0920   BILITOT 0.4 08/23/2023 0920   GFRNONAA 88 07/24/2020 1342   GFRAA 101 07/24/2020 1342    Lab Results  Component Value Date   TSH 1.05 07/24/2020   _________________________________________________________________ Assessment and Plan:  Relapsing remitting multiple sclerosis (HCC) - Plan: MR BRAIN W WO CONTRAST  High risk medication use  OSA (obstructive sleep apnea)  Other fatigue  Migraine with aura and without status migrainosus, not intractable  Urinary frequency   1.   Continue Tysabri .  She is JCV Ab negative 2.  Stay active and exercise as tolerated. 3.   Continue Topiramate  50 mg nightly for migraines.   4.   Continue baclofen  as needed.  Continue oxybutynin  for bladder.  5.   Continue CPAP 6.  She will return to see us   in 6 months or sooner if there are new or worsening neurologic symptoms.   Lolamae Voisin A. Godwin Lat, MD, PhD 03/06/2024, 10:33 AM Certified in Neurology, Clinical Neurophysiology, Sleep Medicine, Pain Medicine and Neuroimaging  [facility] [address] [place], [place] [address] [phone]

## 2024-03-13 NOTE — Telephone Encounter (Signed)
 [name]

## 2024-03-21 ENCOUNTER — Ambulatory Visit (INDEPENDENT_AMBULATORY_CARE_PROVIDER_SITE_OTHER)

## 2024-03-21 ENCOUNTER — Encounter: Payer: Self-pay | Admitting: Neurology

## 2024-03-21 DIAGNOSIS — G35 Multiple sclerosis: Secondary | ICD-10-CM | POA: Diagnosis not present

## 2024-03-21 MED ORDER — GADOBENATE DIMEGLUMINE 529 MG/ML IV SOLN
20.0000 mL | Freq: Once | INTRAVENOUS | Status: AC | PRN
  Administered 2024-03-21: 20 mL via INTRAVENOUS

## 2024-04-23 ENCOUNTER — Other Ambulatory Visit: Payer: Self-pay | Admitting: Family Medicine

## 2024-07-13 ENCOUNTER — Encounter: Payer: Self-pay | Admitting: Family Medicine

## 2024-08-12 ENCOUNTER — Other Ambulatory Visit: Payer: Self-pay | Admitting: Neurology

## 2024-08-13 ENCOUNTER — Ambulatory Visit: Admitting: Family Medicine

## 2024-08-13 NOTE — Telephone Encounter (Signed)
 Last seen on 03/06/24 Follow up scheduled on 10/09/24  TOPIRAMATE   50 MG TABS 06/18/2024 90 180 tablet Sater, Charlie LABOR, MD [facility]    Pt should have enough refill until around beginning of Nov.  Rx denied

## 2024-08-27 ENCOUNTER — Other Ambulatory Visit: Payer: Self-pay

## 2024-08-27 ENCOUNTER — Other Ambulatory Visit

## 2024-08-27 ENCOUNTER — Telehealth: Payer: Self-pay

## 2024-08-27 DIAGNOSIS — G35A Relapsing-remitting multiple sclerosis: Secondary | ICD-10-CM

## 2024-08-27 DIAGNOSIS — R252 Cramp and spasm: Secondary | ICD-10-CM

## 2024-08-27 DIAGNOSIS — R350 Frequency of micturition: Secondary | ICD-10-CM

## 2024-08-27 DIAGNOSIS — G43109 Migraine with aura, not intractable, without status migrainosus: Secondary | ICD-10-CM

## 2024-08-27 DIAGNOSIS — G35D Multiple sclerosis, unspecified: Secondary | ICD-10-CM

## 2024-08-27 NOTE — Telephone Encounter (Signed)
 Sent JCV through [facility]

## 2024-08-28 LAB — CBC WITH DIFFERENTIAL/PLATELET
Basophils Absolute: 0.1 x10E3/uL (ref 0.0–0.2)
Basos: 1 %
EOS (ABSOLUTE): 0.4 x10E3/uL (ref 0.0–0.4)
Eos: 3 %
Hematocrit: 37.8 % (ref 34.0–46.6)
Hemoglobin: 12.5 g/dL (ref 11.1–15.9)
Immature Grans (Abs): 0 x10E3/uL (ref 0.0–0.1)
Immature Granulocytes: 0 %
Lymphocytes Absolute: 7.6 x10E3/uL — ABNORMAL HIGH (ref 0.7–3.1)
Lymphs: 64 %
MCH: 29.1 pg (ref 26.6–33.0)
MCHC: 33.1 g/dL (ref 31.5–35.7)
MCV: 88 fL (ref 79–97)
Monocytes Absolute: 0.9 x10E3/uL (ref 0.1–0.9)
Monocytes: 8 %
NRBC: 2 % — ABNORMAL HIGH (ref 0–0)
Neutrophils Absolute: 2.9 x10E3/uL (ref 1.4–7.0)
Neutrophils: 24 %
Platelets: 285 x10E3/uL (ref 150–450)
RBC: 4.29 x10E6/uL (ref 3.77–5.28)
RDW: 14.9 % (ref 11.7–15.4)
WBC: 11.9 x10E3/uL — ABNORMAL HIGH (ref 3.4–10.8)

## 2024-08-31 ENCOUNTER — Ambulatory Visit (INDEPENDENT_AMBULATORY_CARE_PROVIDER_SITE_OTHER): Admitting: Family Medicine

## 2024-08-31 ENCOUNTER — Encounter: Payer: Self-pay | Admitting: Family Medicine

## 2024-08-31 VITALS — BP 118/80 | HR 76 | Temp 97.9°F | Ht 65.0 in | Wt 212.0 lb

## 2024-08-31 DIAGNOSIS — I10 Essential (primary) hypertension: Secondary | ICD-10-CM

## 2024-08-31 DIAGNOSIS — G43109 Migraine with aura, not intractable, without status migrainosus: Secondary | ICD-10-CM

## 2024-08-31 DIAGNOSIS — Z131 Encounter for screening for diabetes mellitus: Secondary | ICD-10-CM | POA: Diagnosis not present

## 2024-08-31 DIAGNOSIS — G4733 Obstructive sleep apnea (adult) (pediatric): Secondary | ICD-10-CM

## 2024-08-31 DIAGNOSIS — E669 Obesity, unspecified: Secondary | ICD-10-CM | POA: Diagnosis not present

## 2024-08-31 DIAGNOSIS — G35A Relapsing-remitting multiple sclerosis: Secondary | ICD-10-CM

## 2024-08-31 DIAGNOSIS — N3281 Overactive bladder: Secondary | ICD-10-CM

## 2024-08-31 DIAGNOSIS — Z13220 Encounter for screening for lipoid disorders: Secondary | ICD-10-CM | POA: Diagnosis not present

## 2024-08-31 LAB — LIPID PANEL
Cholesterol: 159 mg/dL (ref 0–200)
HDL: 51.2 mg/dL (ref >39.00)
LDL Cholesterol: 87 mg/dL (ref 0–99)
NonHDL: 107.34
Total CHOL/HDL Ratio: 3
Triglycerides: 101 mg/dL (ref 0.0–149.0)
VLDL: 20.2 mg/dL (ref 0.0–40.0)

## 2024-08-31 LAB — COMPREHENSIVE METABOLIC PANEL WITH GFR
ALT: 19 U/L (ref 0–35)
AST: 17 U/L (ref 0–37)
Albumin: 4.7 g/dL (ref 3.5–5.2)
Alkaline Phosphatase: 49 U/L (ref 39–117)
BUN: 14 mg/dL (ref 6–23)
CO2: 25 meq/L (ref 19–32)
Calcium: 9.9 mg/dL (ref 8.4–10.5)
Chloride: 101 meq/L (ref 96–112)
Creatinine, Ser: 0.91 mg/dL (ref 0.40–1.20)
GFR: 80.46 mL/min (ref >60.00)
Glucose, Bld: 97 mg/dL (ref 70–99)
Potassium: 4 meq/L (ref 3.5–5.1)
Sodium: 134 meq/L — ABNORMAL LOW (ref 135–145)
Total Bilirubin: 0.5 mg/dL (ref 0.2–1.2)
Total Protein: 8.7 g/dL — ABNORMAL HIGH (ref 6.0–8.3)

## 2024-08-31 LAB — HEMOGLOBIN A1C: Hgb A1c MFr Bld: 6.1 % (ref 4.6–6.5)

## 2024-08-31 NOTE — Patient Instructions (Addendum)
 Please stop by lab before you go If you have mychart- we will send your results within 3 business days of us  receiving them.  If you do not have mychart- we will call you about results within 5 business days of us  receiving them.  *please also note that you will see labs on mychart as soon as they post. I will later go in and write notes on them- will say notes from Dr. Katrinka Skates on weight loss especially in light of the fracture!   Recommended follow up: Return in about 6 months (around 03/01/2025) for physical or sooner if needed.Schedule b4 you leave.

## 2024-08-31 NOTE — Progress Notes (Signed)
 Phone [phone] In person visit   Subjective:   [name] is a 38 y.o. year old very pleasant female patient who presents for/with See problem oriented charting Chief Complaint  Patient presents with   Hypertension   Multiple Sclerosis    Past Medical History-  Patient Active Problem List   Diagnosis Date Noted   Immunosuppressed status 07/24/2020    Priority: High   Relapsing remitting multiple sclerosis 04/09/2016    Priority: High   OAB (overactive bladder) 07/27/2022    Priority: Medium    OSA (obstructive sleep apnea) 09/15/2020    Priority: Medium    Anxiety 08/25/2018    Priority: Medium    Urinary frequency 04/09/2016    Priority: Medium    Essential hypertension 04/09/2016    Priority: Medium    Migraine 03/29/2016    Priority: Medium    Leiomyoma 02/21/2018    Priority: Low   High risk medication use 04/09/2016    Priority: Low   Allergic rhinitis due to pollen 02/06/2010    Priority: Low    Medications- reviewed and updated Current Outpatient Medications  Medication Sig Dispense Refill   amLODipine  (NORVASC ) 2.5 MG tablet TAKE 1 TABLET BY MOUTH DAILY 90 tablet 3   Azelastine-Fluticasone  137-50 MCG/ACT SUSP SMARTSIG:1 Spray(s) Both Nares Twice Daily PRN     baclofen  (LIORESAL ) 10 MG tablet Take 1 tablet (10 mg total) by mouth 3 (three) times daily. 270 tablet 1   busPIRone  (BUSPAR ) 5 MG tablet TAKE 1 TABLET BY MOUTH 3 TIMES DAILY AS NEEDED. 60 tablet 2   cetirizine  (ZYRTEC ) 10 MG tablet TAKE 1 TABLET BY MOUTH EVERY DAY AS NEEDED FOR ALLERGY 90 tablet 1   fluticasone  (FLONASE ) 50 MCG/ACT nasal spray TWO SPRAYS EACH NOSTRIL ONCE A DAY 1 g 0   ibuprofen  (ADVIL ,MOTRIN ) 800 MG tablet Take 1 tablet (800 mg total) by mouth every 8 (eight) hours as needed (mild pain). 30 tablet 0   natalizumab  (TYSABRI ) 300 MG/15ML injection Inject 300 mg into the vein every 30 (thirty) days.     oxybutynin  (DITROPAN -XL) 10 MG 24 hr tablet TAKE 1 TABLET BY MOUTH  DAILY AT  BEDTIME 90 tablet 3   rizatriptan  (MAXALT -MLT) 10 MG disintegrating tablet TAKE 1 TABLET BY MOUTH AS NEEDED FOR MIGRAINE. MAY REPEAT IN 2 HOURS IF NEEDED 9 tablet 3   topiramate  (TOPAMAX ) 50 MG tablet TAKE 1 TABLET BY MOUTH TWICE  DAILY 180 tablet 3   triamterene -hydrochlorothiazide  (MAXZIDE -25) 37.5-25 MG tablet Take 1 tablet by mouth daily. 90 tablet 3   No current facility-administered medications for this visit.     Objective:  BP 118/80 (BP Location: Left Arm, Patient Position: Sitting, Cuff Size: Normal)   Pulse 76   Temp 97.9 F (36.6 C) (Temporal)   Ht 5' 5 (1.651 m)   Wt 212 lb (96.2 kg)   LMP 08/17/2024 (Exact Date)   SpO2 97%   BMI 35.28 kg/m  Gen: NAD, resting comfortably CV: RRR no murmurs rubs or gallops Lungs: CTAB no crackles, wheeze, rhonchi  Ext: no edema Skin: warm, dry Neuro: grossly normal, moves all extremities     Assessment and Plan   # History of subacute cough-had lingering cough after flu last visit and we did treat with prednisone - full cleared   #metatarsal fracture- just got out of boot last week- working with Dr. Kit- happened at work. Has one more follow up  .still some pressure in foot if active- got some [place]  #  Recent lab work-mildly high white blood count.  Continues to be monitored by neurology and/or us    #prediabetes- wants to work on lifestyle. Sugars have been slightly over 100 at times- check a1c. She is down 4 lbs from last visit- congratulated! Back to walking/working out- set back with the boot but still overall improved   #Hypertension S: Compliant with triamterene  hydrochlorothiazide  37.5-25 mg, amlodipine  2.5 mg - high at infusion but then 120 at home BP Readings from Last 3 Encounters:  08/31/24 118/80  03/06/24 138/85  02/07/24 122/80  A/P: blood pressure well controlled today- continue current medications     #screening hyperlipidemia- #s look pretty good other than mildly high triglyceride(s)- update  today Lab Results  Component Value Date   CHOL 133 01/19/2022   HDL 49.70 01/19/2022   LDLCALC 52 01/19/2022   TRIG 156.0 (H) 01/19/2022   CHOLHDL 3 01/19/2022    % Multiple sclerosis %migraines S: Patient is doing reasonably well on Tysabri  300 mg every 30 days.  Continues to follow with Dr. Vear. -also on topamax  for migraines at 50 mg with rizatriptan  as backup- not needing much lately- once or twice a month -Baclofen  available for muscle spasms -Stable brain MRI 03/21/2024 A/P: MS stable- continue to monitor with Dr. Vear and continue current medications  Migraines reasonably stable- continue current medications     #OSA on cpap- started in early 2024- still consistent   %Overactive bladder/urinary frequency- oxybutynin  nightly - helpful still   #Anxiety - not needing buspirone  lately- has done very well  Recommended follow up: Return in about 6 months (around 03/01/2025) for physical or sooner if needed.Schedule b4 you leave. Future Appointments  Date Time Provider Department Center  10/09/2024  9:30 AM Lomax, Amy, NP [facility] None    Lab/Order associations: water only so far today   ICD-10-CM   1. Essential hypertension  I10 Comprehensive metabolic panel with GFR    2. Obesity (BMI 30-39.9)  E66.9 Hemoglobin A1c    3. Screening for diabetes mellitus  Z13.1 Hemoglobin A1c    4. Screening for hyperlipidemia  Z13.220 Lipid panel    5. Relapsing remitting multiple sclerosis  G35.A     6. OAB (overactive bladder)  N32.81     7. Migraine with aura and without status migrainosus, not intractable  G43.109     8. OSA (obstructive sleep apnea)  G47.33       No orders of the defined types were placed in this encounter.   Return precautions advised.  Garnette Lukes, MD

## 2024-09-01 ENCOUNTER — Ambulatory Visit: Payer: Self-pay | Admitting: Family Medicine

## 2024-09-03 NOTE — Telephone Encounter (Signed)
 SABRA

## 2024-09-12 ENCOUNTER — Encounter: Payer: Self-pay | Admitting: Neurology

## 2024-10-02 NOTE — Progress Notes (Signed)
 PATIENT: [name] DOB: [birth year]  REASON FOR VISIT: follow up HISTORY FROM: patient  Chief Complaint  Patient presents with   Multiple Sclerosis    RM 6 alone  Pt is well, reports ongoing intermitting numbness and weakness in R arm. Does mention increase in fatigue recently.  No OSA concerns.     HISTORY OF PRESENT ILLNESS:  10/09/24 ALL:  [name] is a 38 y.o. female here today for follow up for RRMS. She continues Tysabri  infusions. MRI brain stable 03/2024. Labs have been unremarkable, JCV from 08/2024 0.23, negative.   She is doing fairly well, today. She denies changes in gait. She does report worsening of right arm numbness. She has not taken baclofen , recently.   She continues to work out at the gym 4-5 days a week. She does cardio and HIT workouts. She usually does not need to take baclofen  every day but has recently taken every night. Muscle cramps seem to be a little more frequent over the past couple weeks. No weakness, numbness or tingling. She is drinking 160-200 ounces of water daily. She takes a MVI. BP is usually normal at home. Elevated in the office, today. She is now taking triamterene -hydrochlorothiazide  and amlodipine .   Mood seems fairly stable. She continues Buspar  5mg  TID as needed, per PCP. She has not needed it recently. She is sleeping well. She reports being very tired last week but it has seemed better over the past few days. She continues to work full time for [employer].   Headaches have settled down a bit. She continues topiramate  50mg  BID. Rizatriptan  works well, usually only needs 1-2 per month.   Oxybutynin  10mg  daily helps with urinary frequency. No incontinence. No vision changes. Last eye exam in 05/2024. She got new lenses.   She is doing well on CPAP therapy.  She does feel it may be helping with headaches and fatigue.   She is requesting we send a message to her gynecologist, Dr [name] with [facility] regarding  safety of using Lo Loestrin Fe while on Tysabri . No migraine aura, no history of blood clots and no breast cancer history.    History (copied from Dr Duncan previous note):  [name] is a 38 y.o. woman with relapsing remitting MS.        Update 09/22/2022: She feels her MS is stable and she denies any exacerbations or new symptoms.    She is on Tysabri  and tolerates it well.  Her last MRI (brain and cervical spine) was 02/20/2020.  There were no new lesions.  JCV Ab was negative 09/23/2021 (0.27)       Gait is doing well.   Se has no falls.   She can walk 2 miles straight.    She can go up and down stairs well. And only uses a bannister sometimes.     She does daily exercises.    Strength is stable and the right side is slightly weaker than her left (noted with working out).  Muscle cramps are better.  She denies tingling or numbness.    Her urinary urgency is helped by oxybutynin .    She has no new vision changes.     She has some fatigue  more than last year.  She is sleeping well most night (6-8 hours) but does wake up 1-2 times nightly   She takes naps.  She snores.   She had a sleep study in 2021 showing mild OSA=12.2.  A  dental appliance was recmmended but insurance did not cover..   Mood is doing ok.   She is back n buspirone  for anxiety.   It has helped.      Migraines occur 1/7 days a week down form 4/7 since starting TPM.   They are helped by Maxalt       EPWORTH SLEEPINESS SCALE   On a scale of 0 - 3 what is the chance of dozing:   Sitting and Reading:                           3 Watching TV:                                                  3 Sitting inactive in a public place:        0 Passenger in car for one hour:           3 Lying down to rest in the afternoon:   3 Sitting and talking to someone:          0 Sitting quietly after lunch:                   2 In a car, stopped in traffic:                  0   Total (out of 24):   14/24 (mild to moderate EDS)     MS  History:    In May, 2017,  she had the onset of right arm numbness and weakness that developed over one day.    She went to the emergency room and an MRI of the brain on 03/28/2016 showed multiple T2/FLAIR hyperintense foci consistent with multiple sclerosis. She was admitted and received 5 days of IV steroids. While in the hospital she had a contrasted MRI of the brain that showed that 2 of those lesions enhanced consistent with more acute onset. Additionally, she had 2 lesions in the cervical spine, both enhancing. The one at C6 was towards the right and could explain her symptoms. Another one was adjacent to C3-C4, posteriorly. She also had a lumbar puncture. The cerebrospinal fluid shows that there were 9 oligoclonal bands in the CSF which is also consistent with multiple sclerosis. Due to the aggressiveness of her MS, she was started on Tysabri . She is JCV antibody negative (0.25).   In retrospect, in 2011, she had headaches and tremors and had an MRI of the brain. It showed a single T2/FLAIR hyperintense focus in the right periventricular white matter of the parietal lobe.   Follow-up MRI in 2012 did not show any new lesions. The symptoms improved and she had no other symptoms until 2017   Imaging: MRI of the brain 03/16/2022 showed Multiple T2/FLAIR hyperintense foci in the hemispheres in a pattern consistent with chronic demyelinating plaque associated with multiple sclerosis.  None of the foci enhanced.  They do not appear to be acute.  Compared to the MRI from 03/11/2020, there were no new lesions.    MRI of the cervical spine 03/16/2022 Two small foci within the spinal cord adjacent to C3-C4 and C6 consistent with chronic demyelinating plaque associated with multiple sclerosis.   2.   Minimal disc degenerative changes at C3-C4 and C4-C5 not leading to spinal  stenosis or nerve root compression MRI of the cervical spine 03/11/2020 shows T2 hyperintense foci within the spinal cord posteriorly adjacent to  C3-C4 and posterolaterally to the right adjacent to C6.  Minimal disc degenerative changes at C3-C4.   MRI of the brain 03/11/2020 shows Multiple T2/FLAIR hyperintense foci in the hemispheres in a pattern and configuration consistent with chronic demyelinating plaque associated with multiple sclerosis.  None of the foci appear to be acute and they were all present on the 08/29/2018 MRI.   MRI of the brain 08/29/2018 shows T2/FLAIR hyperintense foci in the hemispheres and left pons in a pattern and configuration consistent with chronic demyelinating plaque associated with multiple sclerosis.  None of the foci appear to be acute.  When compared to the MRI from May 2017, there are no new lesions.  Some of the foci that were acute with enhancement in 2017 are smaller on the current study and no longer enhance.   MRI of the thoracic spine 03/29/2016 was normal  REVIEW OF SYSTEMS: Out of a complete 14 system review of symptoms, the patient complains only of the following symptoms, muscle cramps, fatigue, snoring, anxiety, and all other reviewed systems are negative.  ESS: 5/24. Previously 14/24  ALLERGIES: Allergies  Allergen Reactions   Amoxicillin  Rash    HOME MEDICATIONS: Outpatient Medications Prior to Visit  Medication Sig Dispense Refill   amLODipine  (NORVASC ) 2.5 MG tablet TAKE 1 TABLET BY MOUTH DAILY 90 tablet 3   Azelastine-Fluticasone  137-50 MCG/ACT SUSP SMARTSIG:1 Spray(s) Both Nares Twice Daily PRN     baclofen  (LIORESAL ) 10 MG tablet Take 1 tablet (10 mg total) by mouth 3 (three) times daily. 270 tablet 1   busPIRone  (BUSPAR ) 5 MG tablet TAKE 1 TABLET BY MOUTH 3 TIMES DAILY AS NEEDED. 60 tablet 2   cetirizine  (ZYRTEC ) 10 MG tablet TAKE 1 TABLET BY MOUTH EVERY DAY AS NEEDED FOR ALLERGY 90 tablet 1   fluticasone  (FLONASE ) 50 MCG/ACT nasal spray TWO SPRAYS EACH NOSTRIL ONCE A DAY 1 g 0   ibuprofen  (ADVIL ,MOTRIN ) 800 MG tablet Take 1 tablet (800 mg total) by mouth every 8 (eight) hours as  needed (mild pain). 30 tablet 0   natalizumab  (TYSABRI ) 300 MG/15ML injection Inject 300 mg into the vein every 30 (thirty) days.     oxybutynin  (DITROPAN -XL) 10 MG 24 hr tablet TAKE 1 TABLET BY MOUTH DAILY AT  BEDTIME 90 tablet 3   rizatriptan  (MAXALT -MLT) 10 MG disintegrating tablet TAKE 1 TABLET BY MOUTH AS NEEDED FOR MIGRAINE. MAY REPEAT IN 2 HOURS IF NEEDED 9 tablet 3   topiramate  (TOPAMAX ) 50 MG tablet TAKE 1 TABLET BY MOUTH TWICE  DAILY 180 tablet 3   triamterene -hydrochlorothiazide  (MAXZIDE -25) 37.5-25 MG tablet Take 1 tablet by mouth daily. 90 tablet 3   No facility-administered medications prior to visit.    PAST MEDICAL HISTORY: Past Medical History:  Diagnosis Date   Allergic rhinitis due to pollen 02/06/2010   Esophageal reflux 11/29/2008   HEADACHE, CLUSTER, CHRONIC 02/06/2010   patient reports migraines, not cluster headaches from problem list   Hypertension    Multiple sclerosis    Neuromuscular disorder (HCC)    multiple sclerosis    PAST SURGICAL HISTORY: Past Surgical History:  Procedure Laterality Date   CESAREAN SECTION  06/29/2012   Procedure: CESAREAN SECTION;  Surgeon: Lynwood FORBES Curlene PONCE, MD;  Location: [facility];  Service: Obstetrics;  Laterality: N/A;   IUD REMOVAL  2018   MYOMECTOMY N/A 02/21/2018   Procedure: MYOMECTOMY,  ABDOMINAL;  Surgeon: Johnnye Ade, MD;  Location: [facility];  Service: Gynecology;  Laterality: N/A;    FAMILY HISTORY: Family History  Problem Relation Age of Onset   Lung cancer Maternal Grandfather    Hypertension Mother    Hypertension Father    Mental illness Maternal Grandmother    Diabetes Maternal Grandmother    Glaucoma Paternal Grandmother    Hypertension Paternal Grandfather     SOCIAL HISTORY: Social History   Socioeconomic History   Marital status: Single    Spouse name: Not on file   Number of children: 1   Years of education: Not on file   Highest education level: Not on file  Occupational History   Not on file   Tobacco Use   Smoking status: Never   Smokeless tobacco: Never  Vaping Use   Vaping status: Never Used  Substance and Sexual Activity   Alcohol use: Yes    Alcohol/week: 0.0 - 1.0 standard drinks of alcohol    Comment: occ   Drug use: No   Sexual activity: Yes    Birth control/protection: Implant  Other Topics Concern   Not on file  Social History Narrative   Lives at home with son (born 2013)  - moving to [place] spring 2024 to live with Father of son lives in [place]- long term relationship      Back 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   Caffeine none.   Social Drivers of Corporate Investment Banker Strain: Not on file  Food Insecurity: Not on file  Transportation Needs: Not on file  Physical Activity: Not on file  Stress: Not on file  Social Connections: Not on file  Intimate Partner Violence: Not on file    PHYSICAL EXAM  Vitals:   10/09/24 0930  BP: 133/81  Pulse: 68  Weight: 222 lb (100.7 kg)  Height: 5' 5 (1.651 m)     Body mass index is 36.94 kg/m.  Generalized: Well developed, in no acute distress  Cardiology: normal rate and rhythm, no murmur noted Respiratory: clear to auscultation bilaterally  Mallampati 3+ Neurological examination  Mentation: Alert oriented to time, place, history taking. Follows all commands speech and language fluent Cranial nerve II-XII: Pupils were equal round reactive to light. Extraocular movements were full, visual field were full on confrontational test. Facial sensation and strength were normal. Uvula tongue midline. Head turning and shoulder shrug  were normal and symmetric. Motor: The motor testing reveals 5 over 5 strength of all 4 extremities. Good symmetric motor tone is noted throughout.  Sensory: Sensory testing is intact to soft touch on all 4 extremities. No evidence of extinction is noted.  Coordination: Cerebellar  testing reveals good finger-nose-finger and heel-to-shin bilaterally.  Gait and station: Gait is normal.  Reflexes: Deep tendon reflexes are symmetric and normal bilaterally.    DIAGNOSTIC DATA (LABS, IMAGING, TESTING) - I reviewed patient records, labs, notes, testing and imaging myself where available.      No data to display           Lab Results  Component Value Date   WBC 11.9 (H) 08/27/2024   HGB 12.5 08/27/2024   HCT 37.8 08/27/2024   MCV 88 08/27/2024   PLT 285 08/27/2024      Component Value Date/Time   NA 134 (L) 08/31/2024 1002   NA 136 08/23/2023 0920   K  4.0 08/31/2024 1002   CL 101 08/31/2024 1002   CO2 25 08/31/2024 1002   GLUCOSE 97 08/31/2024 1002   BUN 14 08/31/2024 1002   BUN 9 08/23/2023 0920   CREATININE 0.91 08/31/2024 1002   CREATININE 0.87 07/24/2020 1342   CALCIUM 9.9 08/31/2024 1002   PROT 8.7 (H) 08/31/2024 1002   PROT 7.9 08/23/2023 0920   ALBUMIN 4.7 08/31/2024 1002   ALBUMIN 4.5 08/23/2023 0920   ALBUMIN 3,800 03/31/2016 1024   AST 17 08/31/2024 1002   ALT 19 08/31/2024 1002   ALKPHOS 49 08/31/2024 1002   BILITOT 0.5 08/31/2024 1002   BILITOT 0.4 08/23/2023 0920   GFRNONAA 88 07/24/2020 1342   GFRAA 101 07/24/2020 1342   Lab Results  Component Value Date   CHOL 159 08/31/2024   HDL 51.20 08/31/2024   LDLCALC 87 08/31/2024   TRIG 101.0 08/31/2024   CHOLHDL 3 08/31/2024   Lab Results  Component Value Date   HGBA1C 6.1 08/31/2024   No results found for: VITAMINB12 Lab Results  Component Value Date   TSH 1.05 07/24/2020       ASSESSMENT AND PLAN 38 y.o. year old female  has a past medical history of Allergic rhinitis due to pollen (02/06/2010), Esophageal reflux (11/29/2008), HEADACHE, CLUSTER, CHRONIC (02/06/2010), Hypertension, Multiple sclerosis, and Neuromuscular disorder (HCC). here with     ICD-10-CM   1. Relapsing remitting multiple sclerosis  G35.A     2. High risk medication use  Z79.899     3. Migraine  with aura and without status migrainosus, not intractable  G43.109     4. Urinary frequency  R35.0     5. Muscle cramps  R25.2     6. Other fatigue  R53.83     7. OSA (obstructive sleep apnea)  G47.33 For home use only DME continuous positive airway pressure (CPAP)      Ardelle is doing well, today. No new or exacerbating symptoms. She will continue Tysabri  infusions monthly. Labs reviewed in Epic. JCV 0.23, assay negative 08/2024. MRI stable 03/2024. She will continue baclofen , topiramate  and oxybutynin . We discussed increasing baclofen  dose if needed. Continue rizatriptan  as needed for migraines. Continue CPAP nightly for at least 4 hours. There are no known contraindications of taking Lo Loestrin while on Tysabri . She was advised to discuss potential adverse effects with GYN. She was encouraged to focus on healthy lifestyle habits and continue close follow up with PCP. She will follow up in 6 months.    Orders Placed This Encounter  Procedures   For home use only DME continuous positive airway pressure (CPAP)    Heated Humidity with all supplies as needed    Length of Need:   Lifetime    Patient has OSA or probable OSA:   Yes    Is the patient currently using CPAP in the home:   Yes    Settings:   Other see comments    CPAP supplies needed:   Mask, headgear, cushions, filters, heated tubing and water chamber      No orders of the defined types were placed in this encounter.      Greig Forbes, FNP-C 10/09/2024, 10:37 AM [facility] [address] [place], [place] [address] [phone]

## 2024-10-02 NOTE — Patient Instructions (Signed)
 Below is our plan:  We will continue current treatment plan. I do not have any concerns of you taking Lo Loestrin while taking Tysabri  but encourage you to discuss pros and cons with your GYN provider.   Please continue using your CPAP regularly. While your insurance requires that you use CPAP at least 4 hours each night on 70% of the nights, I recommend, that you not skip any nights and use it throughout the night if you can. Getting used to CPAP and staying with the treatment long term does take time and patience and discipline. Untreated obstructive sleep apnea when it is moderate to severe can have an adverse impact on cardiovascular health and raise her risk for heart disease, arrhythmias, hypertension, congestive heart failure, stroke and diabetes. Untreated obstructive sleep apnea causes sleep disruption, nonrestorative sleep, and sleep deprivation. This can have an impact on your day to day functioning and cause daytime sleepiness and impairment of cognitive function, memory loss, mood disturbance, and problems focussing. Using CPAP regularly can improve these symptoms.  We will update supply orders, today.   Please make sure you are staying well hydrated. I recommend 50-60 ounces daily. Well balanced diet and regular exercise encouraged. Consistent sleep schedule with 6-8 hours recommended.   Please continue follow up with care team as directed.   Follow up with Dr Vear in 6 months   You may receive a survey regarding today's visit. I encourage you to leave honest feed back as I do use this information to improve patient care. Thank you for seeing me today!

## 2024-10-04 ENCOUNTER — Encounter: Payer: Self-pay | Admitting: Neurology

## 2024-10-08 NOTE — Progress Notes (Unsigned)
 [name]

## 2024-10-09 ENCOUNTER — Encounter: Payer: Self-pay | Admitting: Family Medicine

## 2024-10-09 ENCOUNTER — Ambulatory Visit: Admitting: Family Medicine

## 2024-10-09 VITALS — BP 133/81 | HR 68 | Ht 65.0 in | Wt 222.0 lb

## 2024-10-09 DIAGNOSIS — G4733 Obstructive sleep apnea (adult) (pediatric): Secondary | ICD-10-CM

## 2024-10-09 DIAGNOSIS — G35A Relapsing-remitting multiple sclerosis: Secondary | ICD-10-CM | POA: Diagnosis not present

## 2024-10-09 DIAGNOSIS — G43109 Migraine with aura, not intractable, without status migrainosus: Secondary | ICD-10-CM | POA: Diagnosis not present

## 2024-10-09 DIAGNOSIS — Z79899 Other long term (current) drug therapy: Secondary | ICD-10-CM

## 2024-10-09 DIAGNOSIS — R350 Frequency of micturition: Secondary | ICD-10-CM | POA: Diagnosis not present

## 2024-10-09 DIAGNOSIS — R252 Cramp and spasm: Secondary | ICD-10-CM

## 2024-10-09 DIAGNOSIS — R5383 Other fatigue: Secondary | ICD-10-CM

## 2024-11-06 ENCOUNTER — Other Ambulatory Visit: Payer: Self-pay | Admitting: *Deleted

## 2024-11-06 MED ORDER — OXYBUTYNIN CHLORIDE ER 10 MG PO TB24: 10.0000 mg | ORAL_TABLET | Freq: Every day | ORAL | 1 refills | Status: AC

## 2024-11-06 NOTE — Telephone Encounter (Signed)
 Last seen on 10/09/24 per note  Continue oxybutynin  for bladder.  Follow up scheduled on 05/14/25

## 2024-11-19 ENCOUNTER — Telehealth: Payer: Self-pay | Admitting: *Deleted

## 2024-11-19 NOTE — Telephone Encounter (Signed)
 Touch Tysbari outreach form faxed to [phone] confirmation received.

## 2024-11-20 NOTE — Telephone Encounter (Addendum)
 SABRA

## 2024-12-03 ENCOUNTER — Telehealth: Payer: Self-pay

## 2024-12-03 NOTE — Telephone Encounter (Signed)
 has an infusion scheduled for 12/04/24. We were just informed that her ins termed on 11/14/24. I am unable to reach the patient. I have called all of the numbers on file and she hangs up on me when she answers.  Can someone please send the patient a my chart message and ask her to call me with her new insurance? She will not be able to infuse until we have an approval for her new insurance.  Please ask her to call  Mliss at [phone]  Thank you

## 2024-12-03 NOTE — Telephone Encounter (Signed)
 Called pt to inform her of this information and she was not able to take down the information at that moment. Pt requested for this information to be sent to her on mychart. Please advise.

## 2025-03-08 ENCOUNTER — Encounter: Admitting: Family Medicine

## 2025-05-14 ENCOUNTER — Ambulatory Visit: Admitting: Neurology
